# Patient Record
Sex: Female | Born: 1960 | Race: White | Hispanic: No | Marital: Married | Smoking: Current every day smoker
Health system: Southern US, Community
[De-identification: ages and names within clinical notes are randomized; demographics above are authoritative.]

## PROBLEM LIST (undated history)

## (undated) DIAGNOSIS — M549 Dorsalgia, unspecified: Secondary | ICD-10-CM

## (undated) DIAGNOSIS — Q782 Osteopetrosis: Secondary | ICD-10-CM

## (undated) DIAGNOSIS — F410 Panic disorder [episodic paroxysmal anxiety] without agoraphobia: Secondary | ICD-10-CM

## (undated) DIAGNOSIS — G8929 Other chronic pain: Secondary | ICD-10-CM

## (undated) DIAGNOSIS — E0500 Thyrotoxicosis with diffuse goiter without thyrotoxic crisis or storm: Secondary | ICD-10-CM

## (undated) DIAGNOSIS — J449 Chronic obstructive pulmonary disease, unspecified: Secondary | ICD-10-CM

## (undated) DIAGNOSIS — F419 Anxiety disorder, unspecified: Secondary | ICD-10-CM

## (undated) DIAGNOSIS — J189 Pneumonia, unspecified organism: Secondary | ICD-10-CM

## (undated) DIAGNOSIS — E079 Disorder of thyroid, unspecified: Secondary | ICD-10-CM

## (undated) HISTORY — PX: TOTAL ABDOMINAL HYSTERECTOMY: SHX209

## (undated) HISTORY — DX: Panic disorder (episodic paroxysmal anxiety): F41.0

## (undated) HISTORY — PX: NEPHRECTOMY: SHX65

## (undated) HISTORY — PX: TUBAL LIGATION: SHX77

## (undated) HISTORY — DX: Chronic obstructive pulmonary disease, unspecified: J44.9

## (undated) HISTORY — DX: Osteopetrosis: Q78.2

## (undated) HISTORY — DX: Anxiety disorder, unspecified: F41.9

---

## 2005-05-09 ENCOUNTER — Emergency Department (HOSPITAL_COMMUNITY): Admission: EM | Admit: 2005-05-09 | Discharge: 2005-05-09 | Payer: Self-pay | Admitting: Emergency Medicine

## 2006-04-17 ENCOUNTER — Ambulatory Visit (HOSPITAL_COMMUNITY): Admission: RE | Admit: 2006-04-17 | Discharge: 2006-04-17 | Payer: Self-pay | Admitting: Family Medicine

## 2007-05-29 ENCOUNTER — Ambulatory Visit (HOSPITAL_COMMUNITY): Admission: RE | Admit: 2007-05-29 | Discharge: 2007-05-29 | Payer: Self-pay | Admitting: Family Medicine

## 2007-06-24 ENCOUNTER — Ambulatory Visit (HOSPITAL_COMMUNITY): Admission: RE | Admit: 2007-06-24 | Discharge: 2007-06-24 | Payer: Self-pay | Admitting: Family Medicine

## 2007-10-07 ENCOUNTER — Ambulatory Visit (HOSPITAL_COMMUNITY): Admission: RE | Admit: 2007-10-07 | Discharge: 2007-10-07 | Payer: Self-pay | Admitting: Family Medicine

## 2007-12-25 ENCOUNTER — Ambulatory Visit (HOSPITAL_COMMUNITY): Admission: RE | Admit: 2007-12-25 | Discharge: 2007-12-25 | Payer: Self-pay | Admitting: Family Medicine

## 2007-12-30 ENCOUNTER — Ambulatory Visit (HOSPITAL_COMMUNITY): Admission: RE | Admit: 2007-12-30 | Discharge: 2007-12-30 | Payer: Self-pay | Admitting: Surgery

## 2008-01-03 ENCOUNTER — Ambulatory Visit (HOSPITAL_COMMUNITY): Admission: RE | Admit: 2008-01-03 | Discharge: 2008-01-03 | Payer: Self-pay | Admitting: Family Medicine

## 2008-01-09 ENCOUNTER — Encounter (HOSPITAL_COMMUNITY): Admission: RE | Admit: 2008-01-09 | Discharge: 2008-02-08 | Payer: Self-pay | Admitting: Family Medicine

## 2008-01-21 ENCOUNTER — Ambulatory Visit (HOSPITAL_COMMUNITY): Admission: RE | Admit: 2008-01-21 | Discharge: 2008-01-21 | Payer: Self-pay | Admitting: Family Medicine

## 2008-03-09 ENCOUNTER — Emergency Department (HOSPITAL_COMMUNITY): Admission: EM | Admit: 2008-03-09 | Discharge: 2008-03-09 | Payer: Self-pay | Admitting: Emergency Medicine

## 2008-03-16 ENCOUNTER — Emergency Department (HOSPITAL_COMMUNITY): Admission: EM | Admit: 2008-03-16 | Discharge: 2008-03-16 | Payer: Self-pay | Admitting: Emergency Medicine

## 2008-04-02 ENCOUNTER — Encounter: Payer: Self-pay | Admitting: Orthopedic Surgery

## 2008-04-08 ENCOUNTER — Ambulatory Visit: Payer: Self-pay | Admitting: Orthopedic Surgery

## 2008-04-08 DIAGNOSIS — S139XXA Sprain of joints and ligaments of unspecified parts of neck, initial encounter: Secondary | ICD-10-CM | POA: Insufficient documentation

## 2008-04-08 DIAGNOSIS — M5020 Other cervical disc displacement, unspecified cervical region: Secondary | ICD-10-CM

## 2008-04-10 ENCOUNTER — Telehealth: Payer: Self-pay | Admitting: Orthopedic Surgery

## 2008-04-15 ENCOUNTER — Ambulatory Visit (HOSPITAL_COMMUNITY): Admission: RE | Admit: 2008-04-15 | Discharge: 2008-04-15 | Payer: Self-pay | Admitting: Orthopedic Surgery

## 2008-04-15 ENCOUNTER — Encounter: Admission: RE | Admit: 2008-04-15 | Discharge: 2008-07-07 | Payer: Self-pay | Admitting: Orthopedic Surgery

## 2008-04-16 ENCOUNTER — Encounter: Payer: Self-pay | Admitting: Orthopedic Surgery

## 2008-04-22 ENCOUNTER — Ambulatory Visit: Payer: Self-pay | Admitting: Orthopedic Surgery

## 2008-04-22 DIAGNOSIS — M542 Cervicalgia: Secondary | ICD-10-CM

## 2008-05-16 ENCOUNTER — Emergency Department (HOSPITAL_COMMUNITY): Admission: EM | Admit: 2008-05-16 | Discharge: 2008-05-16 | Payer: Self-pay | Admitting: Emergency Medicine

## 2008-06-17 ENCOUNTER — Ambulatory Visit (HOSPITAL_COMMUNITY): Admission: RE | Admit: 2008-06-17 | Discharge: 2008-06-17 | Payer: Self-pay | Admitting: Internal Medicine

## 2008-06-23 ENCOUNTER — Ambulatory Visit (HOSPITAL_COMMUNITY): Admission: RE | Admit: 2008-06-23 | Discharge: 2008-06-23 | Payer: Self-pay | Admitting: Family Medicine

## 2008-06-23 ENCOUNTER — Emergency Department (HOSPITAL_COMMUNITY): Admission: EM | Admit: 2008-06-23 | Discharge: 2008-06-23 | Payer: Self-pay | Admitting: Emergency Medicine

## 2008-07-20 ENCOUNTER — Emergency Department (HOSPITAL_COMMUNITY): Admission: EM | Admit: 2008-07-20 | Discharge: 2008-07-20 | Payer: Self-pay | Admitting: Emergency Medicine

## 2008-10-15 ENCOUNTER — Ambulatory Visit (HOSPITAL_COMMUNITY): Admission: RE | Admit: 2008-10-15 | Discharge: 2008-10-15 | Payer: Self-pay | Admitting: Internal Medicine

## 2009-06-28 ENCOUNTER — Ambulatory Visit (HOSPITAL_COMMUNITY): Admission: RE | Admit: 2009-06-28 | Discharge: 2009-06-28 | Payer: Self-pay | Admitting: Family Medicine

## 2010-01-12 ENCOUNTER — Emergency Department (HOSPITAL_COMMUNITY)
Admission: EM | Admit: 2010-01-12 | Discharge: 2010-01-12 | Payer: Self-pay | Source: Home / Self Care | Admitting: Emergency Medicine

## 2010-01-12 ENCOUNTER — Encounter: Payer: Self-pay | Admitting: Orthopedic Surgery

## 2010-02-02 ENCOUNTER — Ambulatory Visit: Payer: Self-pay | Admitting: Orthopedic Surgery

## 2010-02-02 DIAGNOSIS — M6530 Trigger finger, unspecified finger: Secondary | ICD-10-CM | POA: Insufficient documentation

## 2010-02-09 ENCOUNTER — Ambulatory Visit: Payer: Self-pay | Admitting: Orthopedic Surgery

## 2010-02-14 ENCOUNTER — Telehealth: Payer: Self-pay | Admitting: Orthopedic Surgery

## 2010-04-01 ENCOUNTER — Ambulatory Visit (HOSPITAL_COMMUNITY)
Admission: RE | Admit: 2010-04-01 | Discharge: 2010-04-01 | Payer: Self-pay | Source: Home / Self Care | Attending: Family Medicine | Admitting: Family Medicine

## 2010-04-18 ENCOUNTER — Encounter: Payer: Self-pay | Admitting: Family Medicine

## 2010-04-19 ENCOUNTER — Ambulatory Visit (HOSPITAL_COMMUNITY)
Admission: RE | Admit: 2010-04-19 | Discharge: 2010-04-19 | Payer: Self-pay | Source: Home / Self Care | Attending: Family Medicine | Admitting: Family Medicine

## 2010-04-26 NOTE — Assessment & Plan Note (Signed)
Summary: NEW PROB/AP RT THUMB INJURY/XR [facility] 01/12/10/[facility]/CAF   Vital Signs:  Patient profile:   50 year old female Height:      64 inches Weight:      106 pounds Pulse rate:   70 / minute Resp:     16 per minute  Vitals Entered By: Fuller Canada MD (February 02, 2010 9:43 AM)  Visit Type:  new problem  Referring Provider:  ap er Primary Provider:  Dr. Lysbeth Galas  CC:  right thumb.  History of Present Illness: I saw Heather Wang in the office today for an initial visit.  She is a 50 years old woman with the complaint of:  right thumb.  No injury.  Xrays [facility] 01/12/10 rt thumb.  Meds: Zoloft and Xanax.  This is a 50 year old female, who was moving 2 panels of sheet rock about 8 weeks ago and over the next 2 days developed flexion contracture of the interphalangeal joint of the RIGHT thumb.  she complains of sharp pain over the A1 pulley with inability to extend the IP joint of the thumb.  She reports sudden onset of symptoms, which seem to come and go     Allergies (verified): 1)  ! Nsaids  Past History:  Past Medical History:  Anxiety, Panic Attacks   Family History: Family History Coronary Heart Disease female < 9 FH of Cancer:  Family History of Arthritis  Social History: Patient is married.  unemployed smokes 1 ppd no alcohol coffee daily 11th grade ed  Review of Systems Constitutional:  Denies weight loss, weight gain, fever, chills, and fatigue. Cardiovascular:  Denies chest pain, palpitations, fainting, and murmurs. Respiratory:  Denies short of breath, wheezing, couch, tightness, pain on inspiration, and snoring . Gastrointestinal:  Denies heartburn, nausea, vomiting, diarrhea, constipation, and blood in your stools. Genitourinary:  Denies frequency, urgency, difficulty urinating, painful urination, flank pain, and bleeding in urine. Neurologic:  Denies numbness, tingling, unsteady gait, dizziness, tremors, and seizure. Musculoskeletal:   Denies joint pain, swelling, instability, stiffness, redness, heat, and muscle pain. Endocrine:  Denies excessive thirst, exessive urination, and heat or cold intolerance. Psychiatric:  Complains of depression and anxiety; denies nervousness and hallucinations. Skin:  Denies changes in the skin, poor healing, rash, itching, and redness. HEENT:  Denies blurred or double vision, eye pain, redness, and watering. Immunology:  Denies seasonal allergies, sinus problems, and allergic to bee stings. Hemoatologic:  Complains of brusing; denies easy bleeding.  Physical Exam  Additional Exam:  vital signs her weight is 106 pounds, height 5 feet 4 inches.  Gen. appearance she has a small frame.  She is oriented x3.  Her mood and affect are normal.  Ambulation is normal.  The RIGHT thumb is tender and swollen over the A1 pulley of the thumb and also in the thenar eminence. She has no range of motion at the interphalangeal joint. I do not detect instability or subluxation of the joint. She has no extension power at this point.  The skin is intact, and temperature of the digit is normal. Capillary refill is excellent. Sensation is intact as   Impression & Recommendations:  Problem # 1:  TRIGGER FINGER, RIGHT THUMB (ICD-727.03) Assessment New  injection RIGHT thumb at the A1 pulley, and application of RIGHT thumb aluminum splint  Verbal consent was obtained: The finger was prepped with ethyl chloride and injected with 1:1 injection of .25% sensorcaine, 1cc  and 40 mg of depomedrol, 1cc. There were no complications.  The x-rays  were done at [facility]. The report and the films have been reviewed.  Orders: Est. Patient Level IV (16109) Injection, Tendon / Ligament (60454)  Medications Added to Medication List This Visit: 1)  Norco 5-325 Mg Tabs (Hydrocodone-acetaminophen) .Marland Kitchen.. 1 q 4 h as needed  Patient Instructions: 1)  You have received an injection of cortisone today. You may experience  increased pain at the injection site. Apply ice pack to the area for 20 minutes every 2 hours and take 2 xtra strength tylenol every 8 hours. This increased pain will usually resolve in 24 hours. The injection will take effect in 3-10 days.  2)  keep splint on until Monday  3)  return next week Weds  Prescriptions: NORCO 5-325 MG TABS (HYDROCODONE-ACETAMINOPHEN) 1 q 4 h as needed  #30 x 0   Entered and Authorized by:   Fuller Canada MD   Signed by:   Fuller Canada MD on 02/02/2010   Method used:   Print then Give to Patient   RxID:   [id number]    Orders Added: 1)  Est. Patient Level IV [30865] 2)  Injection, Tendon / Ligament [20550]

## 2010-04-26 NOTE — Assessment & Plan Note (Signed)
Summary: 1 wk reck/ rt thumb/[facility] discount/caf   Visit Type:  Follow-up Referring Provider:  ap er Primary Provider:  Dr. Lysbeth Galas  CC:  recheck trigger finger.  History of Present Illness: I saw Heather Wang in the office today for an initial visit.  She is a 50 years old woman with the complaint of:  right thumb.  No injury.  Xrays [facility] 01/12/10 rt thumb.  Meds: Zolof, Norco 5 given last visit for pain and Xanax.  This is a 50 year old female, who was moving 2 panels of sheet rock about 9 weeks ago and over the next 2 days developed flexion contracture of the interphalangeal joint of the RIGHT thumb.  today for reevaluation. She took the splint off Monday. The finger locked again. It has continued to lock.  Recommend trigger release, RIGHT thumb, and then splinting in extension for 2 weeks and then start flexion.   Allergies (verified): 1)  ! Nsaids  Past History:  Past Medical History: Last updated: 02/02/2010  Anxiety, Panic Attacks   Past Surgical History: Last updated: 04/08/2008 Nephrectomy:Native Total Abdominal Hysterectomy Tubal ligation   Family History: Last updated: 02/02/2010 Family History Coronary Heart Disease female < 41 FH of Cancer:  Family History of Arthritis  Social History: Last updated: 02/02/2010 Patient is married.  unemployed smokes 1 ppd no alcohol coffee daily 11th grade ed  Risk Factors: Smoking Status: current (04/08/2008) Packs/Day: 1 (04/08/2008)  Review of Systems       Review of Systems  Constitutional:  Denies weight loss, weight gain, fever, chills, and fatigue. Cardiovascular:  Denies chest pain, palpitations, fainting, and murmurs. Respiratory:  Denies short of breath, wheezing, couch, tightness, pain on inspiration, and snoring . Gastrointestinal:  Denies heartburn, nausea, vomiting, diarrhea, constipation, and blood in your stools. Genitourinary:  Denies frequency, urgency, difficulty urinating, painful urination,  flank pain, and bleeding in urine. Neurologic:  Denies numbness, tingling, unsteady gait, dizziness, tremors, and seizure. Musculoskeletal:  Denies joint pain, swelling, instability, stiffness, redness, heat, and muscle pain. Endocrine:  Denies excessive thirst, exessive urination, and heat or cold intolerance. Psychiatric:  Complains of depression and anxiety; denies nervousness and hallucinations. Skin:  Denies changes in the skin, poor healing, rash, itching, and redness. HEENT:  Denies blurred or double vision, eye pain, redness, and watering. Immunology:  Denies seasonal allergies, sinus problems, and allergic to bee stings. Hemoatologic:  Complains of brusing; denies easy bleeding.  Physical Exam  Additional Exam:  vital signs her weight is 106 pounds, height 5 feet 4 inches.  Gen. appearance she has a small frame.  She is oriented x3.  Her mood and affect are normal.  Ambulation is normal.  The RIGHT thumb is tender and swollen over the A1 pulley of the thumb and also in the thenar eminence. She has no range of motion at the interphalangeal joint. I do not detect instability or subluxation of the joint. She has no extension power at this point.  The skin is intact, and temperature of the digit is normal. Capillary refill is excellent. Sensation is intact as    Impression & Recommendations:  Problem # 1:  TRIGGER FINGER, RIGHT THUMB (ICD-727.03) Assessment Unchanged  Orders: Est. Patient Level III (09811)  Patient Instructions: 1)  PLAN RIGHT THUMB A1 PULLEY RELEASE / TRIGGER RELEASE  2)  DOS 02/14/10 3)  Preop at [facility] short stay on Friday 02/11/10 register at 1pm, take packet with you 4)  Post op 1 in our office on 02/16/10  Orders Added: 1)  Est. Patient Level III [81859]

## 2010-04-26 NOTE — Letter (Signed)
Summary: history form  history form   Imported By: Jacklynn Ganong 02/02/2010 12:01:24  _____________________________________________________________________  External Attachment:    Type:   Image     Comment:   External Document

## 2010-04-26 NOTE — Progress Notes (Signed)
Summary: Patient did not have surgery today   ---- Converted from flag ---- ---- 02/14/2010 3:34 PM, Chasity L Kandis Ban wrote: pt did not have surgery today per Dr Rexene Edison ------------------------------

## 2010-07-07 LAB — DIFFERENTIAL
Eosinophils Relative: 2 % (ref 0–5)
Lymphocytes Relative: 34 % (ref 12–46)
Lymphs Abs: 1.7 10*3/uL (ref 0.7–4.0)
Monocytes Absolute: 0.3 10*3/uL (ref 0.1–1.0)
Neutro Abs: 2.9 10*3/uL (ref 1.7–7.7)

## 2010-07-07 LAB — COMPREHENSIVE METABOLIC PANEL
AST: 16 U/L (ref 0–37)
Albumin: 3.9 g/dL (ref 3.5–5.2)
BUN: 6 mg/dL (ref 6–23)
Calcium: 9.6 mg/dL (ref 8.4–10.5)
Creatinine, Ser: 0.75 mg/dL (ref 0.4–1.2)
GFR calc Af Amer: >60 mL/min (ref >60)
Total Protein: 6.9 g/dL (ref 6.0–8.3)

## 2010-07-07 LAB — CBC
HCT: 40.7 % (ref 36.0–46.0)
MCHC: 34.6 g/dL (ref 30.0–36.0)
MCV: 88.4 fL (ref 78.0–100.0)
Platelets: 228 10*3/uL (ref 150–400)
RDW: 13.6 % (ref 11.5–15.5)
WBC: 5.1 10*3/uL (ref 4.0–10.5)

## 2010-07-08 ENCOUNTER — Encounter: Payer: Self-pay | Admitting: Orthopedic Surgery

## 2010-07-21 ENCOUNTER — Encounter: Payer: Self-pay | Admitting: Orthopedic Surgery

## 2010-07-21 ENCOUNTER — Ambulatory Visit: Payer: Self-pay | Admitting: Orthopedic Surgery

## 2010-08-24 ENCOUNTER — Ambulatory Visit (HOSPITAL_COMMUNITY)
Admission: RE | Admit: 2010-08-24 | Discharge: 2010-08-24 | Disposition: A | Discharge status: Home / Self Care | Payer: Self-pay | Source: Ambulatory Visit | Attending: *Deleted | Admitting: *Deleted

## 2010-08-24 ENCOUNTER — Other Ambulatory Visit (HOSPITAL_COMMUNITY): Payer: Self-pay | Admitting: *Deleted

## 2010-08-24 DIAGNOSIS — M545 Low back pain, unspecified: Secondary | ICD-10-CM | POA: Insufficient documentation

## 2010-08-24 DIAGNOSIS — M79609 Pain in unspecified limb: Secondary | ICD-10-CM | POA: Insufficient documentation

## 2010-08-28 ENCOUNTER — Emergency Department (HOSPITAL_COMMUNITY): Payer: Self-pay

## 2010-08-28 ENCOUNTER — Emergency Department (HOSPITAL_COMMUNITY)
Admission: EM | Admit: 2010-08-28 | Discharge: 2010-08-28 | Disposition: A | Discharge status: Home / Self Care | Payer: Self-pay | Attending: Emergency Medicine | Admitting: Emergency Medicine

## 2010-08-28 DIAGNOSIS — F17200 Nicotine dependence, unspecified, uncomplicated: Secondary | ICD-10-CM | POA: Insufficient documentation

## 2010-08-28 DIAGNOSIS — R079 Chest pain, unspecified: Secondary | ICD-10-CM | POA: Insufficient documentation

## 2010-08-28 DIAGNOSIS — J189 Pneumonia, unspecified organism: Secondary | ICD-10-CM | POA: Insufficient documentation

## 2010-08-30 ENCOUNTER — Emergency Department (HOSPITAL_COMMUNITY)
Admission: EM | Admit: 2010-08-30 | Discharge: 2010-08-30 | Disposition: A | Discharge status: Home / Self Care | Payer: Self-pay | Attending: Emergency Medicine | Admitting: Emergency Medicine

## 2010-08-30 ENCOUNTER — Emergency Department (HOSPITAL_COMMUNITY): Payer: Self-pay

## 2010-08-30 DIAGNOSIS — F17200 Nicotine dependence, unspecified, uncomplicated: Secondary | ICD-10-CM | POA: Insufficient documentation

## 2010-08-30 DIAGNOSIS — J189 Pneumonia, unspecified organism: Secondary | ICD-10-CM | POA: Insufficient documentation

## 2010-08-30 DIAGNOSIS — R071 Chest pain on breathing: Secondary | ICD-10-CM | POA: Insufficient documentation

## 2010-08-30 LAB — CBC
HCT: 39.7 % (ref 36.0–46.0)
Hemoglobin: 13 g/dL (ref 12.0–15.0)
MCHC: 32.7 g/dL (ref 30.0–36.0)
MCV: 89.2 fL (ref 78.0–100.0)
RDW: 13 % (ref 11.5–15.5)
WBC: 11.8 10*3/uL — ABNORMAL HIGH (ref 4.0–10.5)

## 2010-08-30 LAB — BASIC METABOLIC PANEL
CO2: 30 mEq/L (ref 19–32)
Calcium: 10.2 mg/dL (ref 8.4–10.5)
Chloride: 99 mEq/L (ref 96–112)
GFR calc Af Amer: >60 mL/min (ref >60)
Glucose, Bld: 159 mg/dL — ABNORMAL HIGH (ref 70–99)
Sodium: 136 mEq/L (ref 135–145)

## 2010-08-30 LAB — DIFFERENTIAL
Basophils Absolute: 0 10*3/uL (ref 0.0–0.1)
Eosinophils Relative: 0 % (ref 0–5)
Lymphocytes Relative: 10 % — ABNORMAL LOW (ref 12–46)
Lymphs Abs: 1.2 10*3/uL (ref 0.7–4.0)
Monocytes Absolute: 0.6 10*3/uL (ref 0.1–1.0)
Neutro Abs: 10 10*3/uL — ABNORMAL HIGH (ref 1.7–7.7)

## 2011-01-29 ENCOUNTER — Emergency Department (HOSPITAL_COMMUNITY): Payer: Self-pay

## 2011-01-29 ENCOUNTER — Inpatient Hospital Stay (HOSPITAL_COMMUNITY)
Admission: EM | Admit: 2011-01-29 | Discharge: 2011-01-30 | DRG: 194 | Disposition: A | Discharge status: Home / Self Care | Payer: Self-pay | Attending: Internal Medicine | Admitting: Internal Medicine

## 2011-01-29 ENCOUNTER — Encounter (HOSPITAL_COMMUNITY): Payer: Self-pay

## 2011-01-29 DIAGNOSIS — J4489 Other specified chronic obstructive pulmonary disease: Secondary | ICD-10-CM | POA: Diagnosis present

## 2011-01-29 DIAGNOSIS — F411 Generalized anxiety disorder: Secondary | ICD-10-CM | POA: Diagnosis present

## 2011-01-29 DIAGNOSIS — F419 Anxiety disorder, unspecified: Secondary | ICD-10-CM | POA: Diagnosis present

## 2011-01-29 DIAGNOSIS — J449 Chronic obstructive pulmonary disease, unspecified: Secondary | ICD-10-CM | POA: Diagnosis present

## 2011-01-29 DIAGNOSIS — R091 Pleurisy: Secondary | ICD-10-CM | POA: Diagnosis present

## 2011-01-29 DIAGNOSIS — F17200 Nicotine dependence, unspecified, uncomplicated: Secondary | ICD-10-CM | POA: Diagnosis present

## 2011-01-29 DIAGNOSIS — Z720 Tobacco use: Secondary | ICD-10-CM | POA: Diagnosis present

## 2011-01-29 DIAGNOSIS — J159 Unspecified bacterial pneumonia: Secondary | ICD-10-CM

## 2011-01-29 DIAGNOSIS — J189 Pneumonia, unspecified organism: Principal | ICD-10-CM | POA: Diagnosis present

## 2011-01-29 DIAGNOSIS — F329 Major depressive disorder, single episode, unspecified: Secondary | ICD-10-CM | POA: Diagnosis present

## 2011-01-29 DIAGNOSIS — F32A Depression, unspecified: Secondary | ICD-10-CM | POA: Diagnosis present

## 2011-01-29 DIAGNOSIS — E0590 Thyrotoxicosis, unspecified without thyrotoxic crisis or storm: Secondary | ICD-10-CM | POA: Diagnosis present

## 2011-01-29 DIAGNOSIS — E871 Hypo-osmolality and hyponatremia: Secondary | ICD-10-CM | POA: Diagnosis present

## 2011-01-29 DIAGNOSIS — F3289 Other specified depressive episodes: Secondary | ICD-10-CM | POA: Diagnosis present

## 2011-01-29 HISTORY — DX: Disorder of thyroid, unspecified: E07.9

## 2011-01-29 LAB — URINE MICROSCOPIC-ADD ON

## 2011-01-29 LAB — CBC
Hemoglobin: 13.5 g/dL (ref 12.0–15.0)
MCH: 29.3 pg (ref 26.0–34.0)
MCHC: 33.6 g/dL (ref 30.0–36.0)
MCV: 87.2 fL (ref 78.0–100.0)
RBC: 4.61 MIL/uL (ref 3.87–5.11)

## 2011-01-29 LAB — DIFFERENTIAL
Basophils Relative: 0 % (ref 0–1)
Eosinophils Absolute: 0 10*3/uL (ref 0.0–0.7)
Eosinophils Relative: 0 % (ref 0–5)
Lymphs Abs: 1 10*3/uL (ref 0.7–4.0)
Monocytes Absolute: 0.6 10*3/uL (ref 0.1–1.0)
Monocytes Relative: 4 % (ref 3–12)

## 2011-01-29 LAB — BASIC METABOLIC PANEL
BUN: 11 mg/dL (ref 6–23)
Calcium: 9.7 mg/dL (ref 8.4–10.5)
Creatinine, Ser: 0.85 mg/dL (ref 0.50–1.10)
GFR calc Af Amer: >90 mL/min (ref >90)
GFR calc non Af Amer: 79 mL/min — ABNORMAL LOW (ref >90)
Glucose, Bld: 118 mg/dL — ABNORMAL HIGH (ref 70–99)

## 2011-01-29 LAB — URINALYSIS, ROUTINE W REFLEX MICROSCOPIC
Bilirubin Urine: NEGATIVE
Specific Gravity, Urine: >1.03 — ABNORMAL HIGH (ref 1.005–1.030)
Urobilinogen, UA: 0.2 mg/dL (ref 0.0–1.0)

## 2011-01-29 MED ORDER — SERTRALINE HCL 50 MG PO TABS
100.0000 mg | ORAL_TABLET | Freq: Two times a day (BID) | ORAL | Status: DC
  Administered 2011-01-30: 100 mg via ORAL
  Filled 2011-01-29 (×2): qty 2

## 2011-01-29 MED ORDER — ENOXAPARIN SODIUM 40 MG/0.4ML ~~LOC~~ SOLN
40.0000 mg | SUBCUTANEOUS | Status: DC
  Administered 2011-01-29: 40 mg via SUBCUTANEOUS
  Filled 2011-01-29: qty 0.4

## 2011-01-29 MED ORDER — MORPHINE SULFATE 2 MG/ML IJ SOLN
2.0000 mg | INTRAMUSCULAR | Status: DC | PRN
  Administered 2011-01-29 (×2): 2 mg via INTRAVENOUS
  Filled 2011-01-29 (×2): qty 1

## 2011-01-29 MED ORDER — TRAZODONE HCL 50 MG PO TABS: 50.0000 mg | ORAL_TABLET | Freq: Every evening | ORAL | Status: DC | PRN

## 2011-01-29 MED ORDER — PROPYLTHIOURACIL 50 MG PO TABS
50.0000 mg | ORAL_TABLET | Freq: Two times a day (BID) | ORAL | Status: DC
  Filled 2011-01-29 (×2): qty 1

## 2011-01-29 MED ORDER — SENNA 8.6 MG PO TABS: 2.0000 | ORAL_TABLET | Freq: Every day | ORAL | Status: DC | PRN

## 2011-01-29 MED ORDER — PANTOPRAZOLE SODIUM 40 MG PO TBEC
80.0000 mg | DELAYED_RELEASE_TABLET | Freq: Every day | ORAL | Status: DC
  Administered 2011-01-30: 80 mg via ORAL
  Filled 2011-01-29: qty 2
  Filled 2011-01-29: qty 1

## 2011-01-29 MED ORDER — AZITHROMYCIN 250 MG PO TABS
250.0000 mg | ORAL_TABLET | Freq: Every day | ORAL | Status: DC
  Administered 2011-01-29 – 2011-01-30 (×2): 250 mg via ORAL
  Filled 2011-01-29 (×2): qty 1

## 2011-01-29 MED ORDER — GUAIFENESIN-DM 100-10 MG/5ML PO SYRP: 5.0000 mL | ORAL_SOLUTION | ORAL | Status: DC | PRN

## 2011-01-29 MED ORDER — PROMETHAZINE HCL 25 MG/ML IJ SOLN: 12.5000 mg | Freq: Four times a day (QID) | INTRAMUSCULAR | Status: DC | PRN

## 2011-01-29 MED ORDER — DOCUSATE SODIUM 100 MG PO CAPS
100.0000 mg | ORAL_CAPSULE | Freq: Two times a day (BID) | ORAL | Status: DC
  Administered 2011-01-29 – 2011-01-30 (×2): 100 mg via ORAL
  Filled 2011-01-29 (×2): qty 1

## 2011-01-29 MED ORDER — MORPHINE SULFATE 4 MG/ML IJ SOLN
4.0000 mg | Freq: Once | INTRAMUSCULAR | Status: AC
  Administered 2011-01-29: 4 mg via INTRAVENOUS
  Filled 2011-01-29: qty 1

## 2011-01-29 MED ORDER — ALPRAZOLAM 1 MG PO TABS
1.0000 mg | ORAL_TABLET | Freq: Three times a day (TID) | ORAL | Status: DC
  Administered 2011-01-30: 1 mg via ORAL
  Filled 2011-01-29 (×2): qty 1

## 2011-01-29 MED ORDER — OXYCODONE HCL 5 MG PO TABS
5.0000 mg | ORAL_TABLET | ORAL | Status: DC | PRN
  Administered 2011-01-30: 5 mg via ORAL
  Filled 2011-01-29: qty 1

## 2011-01-29 MED ORDER — ACETAMINOPHEN 325 MG PO TABS
650.0000 mg | ORAL_TABLET | Freq: Four times a day (QID) | ORAL | Status: DC | PRN
  Administered 2011-01-29: 650 mg via ORAL
  Filled 2011-01-29: qty 2

## 2011-01-29 MED ORDER — PROMETHAZINE HCL 12.5 MG PO TABS: 12.5000 mg | ORAL_TABLET | Freq: Four times a day (QID) | ORAL | Status: DC | PRN

## 2011-01-29 MED ORDER — SODIUM CHLORIDE 0.9 % IV SOLN
INTRAVENOUS | Status: DC
  Administered 2011-01-29 – 2011-01-30 (×2): via INTRAVENOUS

## 2011-01-29 MED ORDER — ALUM & MAG HYDROXIDE-SIMETH 200-200-20 MG/5ML PO SUSP: 30.0000 mL | Freq: Four times a day (QID) | ORAL | Status: DC | PRN

## 2011-01-29 MED ORDER — ALBUTEROL SULFATE (5 MG/ML) 0.5% IN NEBU: 2.5000 mg | INHALATION_SOLUTION | RESPIRATORY_TRACT | Status: DC | PRN

## 2011-01-29 MED ORDER — AZITHROMYCIN 250 MG PO TABS
500.0000 mg | ORAL_TABLET | Freq: Once | ORAL | Status: AC
  Administered 2011-01-29: 500 mg via ORAL
  Filled 2011-01-29: qty 2

## 2011-01-29 MED ORDER — ONDANSETRON HCL 4 MG/2ML IJ SOLN
4.0000 mg | Freq: Once | INTRAMUSCULAR | Status: AC
  Administered 2011-01-29: 4 mg via INTRAVENOUS
  Filled 2011-01-29: qty 2

## 2011-01-29 MED ORDER — DEXTROSE 5 % IV SOLN
1.0000 g | INTRAVENOUS | Status: DC
  Administered 2011-01-29: 1 g via INTRAVENOUS
  Filled 2011-01-29 (×2): qty 10

## 2011-01-29 NOTE — ED Notes (Signed)
Pt placed on O2 due to pt's O2 sats at 92-94%; after O2 applied O2 sats to 96%; pt also placed in trendelenburg due to hypotension

## 2011-01-29 NOTE — ED Provider Notes (Addendum)
History     CSN: [id number] Arrival date & time: 01/29/2011 11:45 AM   First MD Initiated Contact with Patient 01/29/11 1158      Chief Complaint  Patient presents with  . Generalized Body Aches  . Fever  . Chills  . Flank Pain    (Consider location/radiation/quality/duration/timing/severity/associated sxs/prior treatment) Patient is a 50 y.o. female presenting with flank pain. The history is provided by the patient.  Flank Pain This is a new problem. Episode onset: Two days ago. The problem occurs constantly. The problem has been unchanged. Associated symptoms include chills, a fever, myalgias and nausea. Pertinent negatives include no abdominal pain, arthralgias, chest pain, congestion, headaches, joint swelling, neck pain, numbness, rash, sore throat or weakness. The symptoms are aggravated by walking and coughing (deep inspiration). She has tried acetaminophen for the symptoms. The treatment provided no relief.  Flank Pain This is a new problem. Episode onset: Two days ago. The problem occurs constantly. The problem has been unchanged. Pertinent negatives include no chest pain, no abdominal pain, no headaches and no shortness of breath. The symptoms are aggravated by walking and coughing (deep inspiration). She has tried acetaminophen for the symptoms. The treatment provided no relief.    Past Medical History  Diagnosis Date  . Anxiety   . Panic attacks   . Thyroid disease     Past Surgical History  Procedure Date  . Nephrectomy     native   . Total abdominal hysterectomy   . Tubal ligation     Family History  Problem Relation Age of Onset  . Heart defect      family history   . Cancer      family history   . Arthritis      family history     History  Substance Use Topics  . Smoking status: Current Everyday Smoker -- 1.5 packs/day    Types: Cigarettes  . Smokeless tobacco: Not on file  . Alcohol Use: No    OB History    Grav Para Term Preterm Abortions  TAB SAB Ect Mult Living                  Review of Systems  Constitutional: Positive for fever and chills.  HENT: Negative for congestion, sore throat and neck pain.   Eyes: Negative.   Respiratory: Negative for chest tightness and shortness of breath.   Cardiovascular: Negative for chest pain.  Gastrointestinal: Positive for nausea. Negative for abdominal pain.  Genitourinary: Positive for flank pain. Negative for dysuria, hematuria, vaginal discharge and vaginal pain.  Musculoskeletal: Positive for myalgias and back pain. Negative for joint swelling and arthralgias.  Skin: Negative.  Negative for rash and wound.  Neurological: Negative for dizziness, weakness, light-headedness, numbness and headaches.  Hematological: Negative.   Psychiatric/Behavioral: Negative.     Allergies  Nsaids and Sulfa antibiotics  Home Medications  No current outpatient prescriptions on file.  BP 88/54  Pulse 79  Temp(Src) 102.9 F (39.4 C) (Oral)  Resp 20  Ht 5\' 4"  (1.626 m)  Wt 99 lb 10.4 oz (45.2 kg)  BMI 17.10 kg/m2  SpO2 91%  Physical Exam  Nursing note and vitals reviewed. Constitutional: She is oriented to person, place, and time. She appears well-developed and well-nourished. No distress.       Appears fatigued.   HENT:  Head: Normocephalic and atraumatic.  Eyes: Conjunctivae are normal.  Neck: Normal range of motion. Neck supple.  Cardiovascular: Normal rate, regular rhythm, normal  heart sounds and intact distal pulses.        Pedal pulses normal.  Pulmonary/Chest: Effort normal. No respiratory distress. She has decreased breath sounds in the right lower field and the left lower field. She has no wheezes. She has no rales. She exhibits no tenderness.  Abdominal: Soft. Bowel sounds are normal. She exhibits no distension and no mass. There is no tenderness.  Musculoskeletal: Normal range of motion. She exhibits no edema.       Lumbar back: She exhibits tenderness. She exhibits no  swelling, no edema and no spasm.  Neurological: She is alert and oriented to person, place, and time. She has normal strength. She displays no atrophy and no tremor. No cranial nerve deficit or sensory deficit. Gait normal.  Reflex Scores:      Patellar reflexes are 2+ on the right side and 2+ on the left side.      Achilles reflexes are 2+ on the right side and 2+ on the left side.      No strength deficit noted in hip and knee flexor and extensor muscle groups.  Ankle flexion and extension intact.  Skin: Skin is warm and dry. She is not diaphoretic.  Psychiatric: She has a normal mood and affect.    ED Course  Procedures (including critical care time)  Labs Reviewed  CBC - Abnormal; Notable for the following:    WBC 17.0 (*)    All other components within normal limits  DIFFERENTIAL - Abnormal; Notable for the following:    Neutrophils Relative 90 (*)    Neutro Abs 15.4 (*)    Lymphocytes Relative 6 (*)    All other components within normal limits  BASIC METABOLIC PANEL - Abnormal; Notable for the following:    Sodium 131 (*)    Glucose, Bld 118 (*)    GFR calc non Af Amer 79 (*)    All other components within normal limits  URINALYSIS, ROUTINE W REFLEX MICROSCOPIC - Abnormal; Notable for the following:    Specific Gravity, Urine >1.030 (*)    Hgb urine dipstick TRACE (*)    Ketones, ur TRACE (*)    Protein, ur 30 (*)    All other components within normal limits  URINE MICROSCOPIC-ADD ON - Abnormal; Notable for the following:    Squamous Epithelial / LPF FEW (*)    All other components within normal limits  COMPREHENSIVE METABOLIC PANEL  CBC  DIFFERENTIAL   Dg Chest 2 View  01/29/2011  *RADIOLOGY REPORT*  Clinical Data: Shortness of breath with cough and congestion.  CHEST - 2 VIEW  Comparison: 08/30/2010  Findings: Two-view exam shows a focal airspace disease in the posterior left lower lobe, consistent with pneumonia.  Right lung is clear. The cardiopericardial silhouette  is within normal limits for size. Imaged bony structures of the thorax are intact.  IMPRESSION: Posterior left lower lobe pneumonia.  Original Report Authenticated By: ERIC A. MANSELL, M.D.     1. Community acquired bacterial pneumonia     Rocephin 1 gram IV,  zithromax 500 mg PO.  IV fluids while in ed.    MDM  Hypotensive (although pt states her bp is usually low),  Febrile.  Ambulated in dept with sats to 88% (briefly, then pops up to 92-93%.  Call to Dr Lendell Caprice to discuss - will admit.          Candis Musa, PA  Medical screening examination/treatment/procedure(s) were conducted as a shared visit with non-physician practitioner(s) and myself.  I personally evaluated the patient during the encounter                                             Pt complains of weakness.  pe lungs clear.  Heart rrr. Dx pneumonia 01/29/11 1730  [name], MD 01/30/11 2440  [name], MD 01/30/11 [id number]

## 2011-01-29 NOTE — ED Notes (Signed)
Pt presents with left flank pain, fever, chills, and generalized body aches x 2 days. Pt states she has taken tylenol. Pt also c/o nausea. NAD at this time.

## 2011-01-29 NOTE — H&P (Signed)
Hospital Admission Note Date: 01/29/2011  Patient name: Heather Wang Medical record number: [id number] Date of birth: 10-21-1960 Age: 50 y.o. Gender: female PCP: Vertis Kelch, NP, NP, [facility] Department  Attending physician: Christiane Ha  Chief Complaint: Back pain  History of Present Illness: Heather Wang is an 50 y.o. female who presents with a several day history of left-sided back pain which is worse with inspiration. She's had fevers and chills. She has a mild nonproductive cough. She smokes a pack and a half of cigarettes a day. Her appetite has been poor. She's had no diarrhea, sore throat, rhinorrhea or dyspnea. She has a history of COPD but rarely takes bronchodilators. In the emergency room, she was found to have a left lower lobe infiltrate on chest x-ray and a leukocytosis. Initially, she was afebrile. Her blood pressure was borderline low but apparently she tends to run low blood pressures at baseline. With ambulating, she became very fatigued and had an oxygen saturation of 89%. She has received Rocephin, azithromycin, and a liter of fluid. She's also received morphine. She is allergic to nonsteroidal anti-inflammatories. She has had pneumonia in the past.  Past Medical History  Diagnosis Date  . Anxiety   . Panic attacks   . Thyroid disease    Social history: She is married. She is currently caring for her teenage grandkids. She smokes a pack and a half of cigarettes a day. She doesn't drink or use drugs.  Family history: Her brother died of a stroke. Her mother had Alzheimer's disease. Her father had heart disease.  Meds: Prescriptions prior to admission  Medication Sig Dispense Refill  . ALPRAZolam (XANAX) 1 MG tablet Take 1 mg by mouth 3 (three) times daily.        Marland Kitchen omeprazole (PRILOSEC) 40 MG capsule Take 40 mg by mouth daily before breakfast.        . propylthiouracil (PTU) 50 MG tablet Take 50 mg by mouth 2 (two) times daily.        .  sertraline (ZOLOFT) 100 MG tablet Take 100 mg by mouth 2 (two) times daily.        Marland Kitchen acetaminophen (TYLENOL) 500 MG tablet Take 1,000 mg by mouth every 6 (six) hours as needed. For pain        Allergies: Nsaids and Sulfa antibiotics  Past Surgical History  Procedure Date  . Nephrectomy     native   . Total abdominal hysterectomy   . Tubal ligation    Review of Systems: Systems reviewed. As above otherwise negative.  Physical Exam: Blood pressure 88/54, pulse 79, temperature 102.9 F (39.4 C), temperature source Oral, resp. rate 20, height 5\' 4"  (1.626 m), weight 45.2 kg (99 lb 10.4 oz), SpO2 91.00%. BP 88/54  Pulse 79  Temp(Src) 102.9 F (39.4 C) (Oral)  Resp 20  Ht 5\' 4"  (1.626 m)  Wt 45.2 kg (99 lb 10.4 oz)  BMI 17.10 kg/m2  SpO2 91%  General Appearance:    Alert, cooperative, no distress, appears stated age  Head:    Normocephalic, without obvious abnormality, atraumatic  Eyes:    PERRL, conjunctiva/corneas clear, EOM's intact, fundi    benign, both eyes  Ears:    Normal TM's and external ear canals, both ears  Nose:   Nares normal, septum midline, mucosa normal, no drainage    or sinus tenderness  Throat:   Lips, mucosa, and tongue normal; teeth and gums normal  Neck:   Supple, symmetrical, trachea  midline, no adenopathy;    thyroid:  no enlargement/tenderness/nodules; no carotid   bruit or JVD  Back:     Symmetric, no curvature, ROM normal, no CVA tenderness  Lungs:     Clear to auscultation bilaterally, respirations unlabored  Chest Wall:    No tenderness or deformity   Heart:    Regular rate and rhythm, S1 and S2 normal, no murmur, rub   or gallop  Breast Exam:    deferred   Abdomen:     Soft, non-tender, bowel sounds active all four quadrants,    no masses, no organomegaly  Genitalia:   deferred   Rectal:   deferred   Extremities:   Extremities normal, atraumatic, no cyanosis or edema  Pulses:   2+ and symmetric all extremities  Skin:   Skin color, texture,  turgor normal, no rashes or lesions  Lymph nodes:   Cervical, supraclavicular, and axillary nodes normal  Neurologic:   CNII-XII intact, normal strength, sensation and reflexes    throughout    Psychiatric: Normal affect. Calm and cooperative.  Lab results: Basic Metabolic Panel:  Basename 01/29/11 1235  NA 131*  K 3.8  CL 97  CO2 25  GLUCOSE 118*  BUN 11  CREATININE 0.85  CALCIUM 9.7  MG --  PHOS --   Liver Function Tests: No results found for this basename: AST:2,ALT:2,ALKPHOS:2,BILITOT:2,PROT:2,ALBUMIN:2 in the last 72 hours No results found for this basename: LIPASE:2,AMYLASE:2 in the last 72 hours No results found for this basename: AMMONIA:2 in the last 72 hours CBC:  Basename 01/29/11 1235  WBC 17.0*  NEUTROABS 15.4*  HGB 13.5  HCT 40.2  MCV 87.2  PLT 159    Imaging results:  Dg Chest 2 View  01/29/2011  *RADIOLOGY REPORT*  Clinical Data: Shortness of breath with cough and congestion.  CHEST - 2 VIEW  Comparison: 08/30/2010  Findings: Two-view exam shows a focal airspace disease in the posterior left lower lobe, consistent with pneumonia.  Right lung is clear. The cardiopericardial silhouette is within normal limits for size. Imaged bony structures of the thorax are intact.  IMPRESSION: Posterior left lower lobe pneumonia.  Original Report Authenticated By: ERIC A. MANSELL, M.D.    Assessment & Plan: Principal Problem:  *CAP (community acquired pneumonia) Active Problems:  Hyperthyroidism  COPD (chronic obstructive pulmonary disease)  Pleurisy  Tobacco abuse  Hyponatremia  Depression  Anxiety  Patient will be admitted to observation. Continue IV fluids, Rocephin and azithromycin. Check blood cultures, urine Legionella antigen, urine strep pneumonia antigen. She needs to quit smoking. Pain medication for the pleurisy. Monitor sodium. Continue outpatient medications.  Mikella Linsley L 01/29/2011, 5:28 PM

## 2011-01-30 LAB — DIFFERENTIAL
Basophils Absolute: 0 10*3/uL (ref 0.0–0.1)
Basophils Relative: 0 % (ref 0–1)
Lymphocytes Relative: 15 % (ref 12–46)
Monocytes Absolute: 0.4 10*3/uL (ref 0.1–1.0)
Monocytes Relative: 4 % (ref 3–12)
Neutro Abs: 8.1 10*3/uL — ABNORMAL HIGH (ref 1.7–7.7)
Neutrophils Relative %: 81 % — ABNORMAL HIGH (ref 43–77)

## 2011-01-30 LAB — COMPREHENSIVE METABOLIC PANEL
ALT: 12 U/L (ref 0–35)
BUN: 11 mg/dL (ref 6–23)
CO2: 24 mEq/L (ref 19–32)
Calcium: 8.6 mg/dL (ref 8.4–10.5)
GFR calc Af Amer: >90 mL/min (ref >90)
GFR calc non Af Amer: >90 mL/min (ref >90)
Glucose, Bld: 105 mg/dL — ABNORMAL HIGH (ref 70–99)
Total Protein: 5.8 g/dL — ABNORMAL LOW (ref 6.0–8.3)

## 2011-01-30 LAB — STREP PNEUMONIAE URINARY ANTIGEN: Strep Pneumo Urinary Antigen: NEGATIVE

## 2011-01-30 LAB — CBC
HCT: 32.6 % — ABNORMAL LOW (ref 36.0–46.0)
Hemoglobin: 10.9 g/dL — ABNORMAL LOW (ref 12.0–15.0)
MCHC: 33.4 g/dL (ref 30.0–36.0)
RDW: 13 % (ref 11.5–15.5)
WBC: 10 10*3/uL (ref 4.0–10.5)

## 2011-01-30 MED ORDER — LEVOFLOXACIN 500 MG PO TABS: 750.0000 mg | ORAL_TABLET | Freq: Every day | ORAL | Status: AC

## 2011-01-30 MED ORDER — GUAIFENESIN-DM 100-10 MG/5ML PO SYRP: 5.0000 mL | ORAL_SOLUTION | ORAL | Status: AC | PRN

## 2011-01-30 MED ORDER — HYDROCODONE-ACETAMINOPHEN 5-325 MG PO TABS
1.0000 | ORAL_TABLET | Freq: Four times a day (QID) | ORAL | Status: AC | PRN
Start: 2011-01-30 — End: 2011-02-09

## 2011-01-30 NOTE — Progress Notes (Signed)
Pt discharged home via family; Pt and family given and explained all discharge instructions, carenotes, and prescriptions; pt and family stated understanding and denied questions/concerns; all f/u appointments in place; IV removed without complicaitons; pt stable at time of discharge  

## 2011-01-30 NOTE — Discharge Summary (Signed)
Physician Discharge Summary  Patient ID: Heather Wang MRN: [id number] DOB/AGE: 1960/10/12 50 y.o.  Admit date: 01/29/2011 Discharge date: 01/30/2011  Discharge Diagnoses:  Principal Problem:  *CAP (community acquired pneumonia) Active Problems:  Hyperthyroidism  COPD (chronic obstructive pulmonary disease)  Pleurisy  Tobacco abuse  Hyponatremia  Depression  Anxiety   Current Discharge Medication List    START taking these medications   Details  guaiFENesin-dextromethorphan (ROBITUSSIN DM) 100-10 MG/5ML syrup Take 5 mLs by mouth every 4 (four) hours as needed for cough. Qty: 118 mL    HYDROcodone-acetaminophen (NORCO) 5-325 MG per tablet Take 1-2 tablets by mouth every 6 (six) hours as needed for pain. Qty: 20 tablet, Refills: 0    levofloxacin (LEVAQUIN) 500 MG tablet Take 1.5 tablets (750 mg total) by mouth daily. Qty: 5 tablet, Refills: 0      CONTINUE these medications which have NOT CHANGED   Details  ALPRAZolam (XANAX) 1 MG tablet Take 1 mg by mouth 3 (three) times daily.      omeprazole (PRILOSEC) 40 MG capsule Take 40 mg by mouth daily before breakfast.      propylthiouracil (PTU) 50 MG tablet Take 50 mg by mouth 2 (two) times daily.      sertraline (ZOLOFT) 100 MG tablet Take 100 mg by mouth 2 (two) times daily.      acetaminophen (TYLENOL) 500 MG tablet Take 1,000 mg by mouth every 6 (six) hours as needed. For pain         Discharge Orders    Future Orders Please Complete By Expires   Diet general      Increase activity slowly      Discharge instructions      Comments:   Quit smoking. Drink plenty of fluids      Follow-up Information    Follow up with Vertis Kelch, NP. Make an appointment in 4 weeks.         Disposition: Home or Self Care  Discharged Condition: stable  Consults:  none  Labs:   Results for orders placed during the hospital encounter of 01/29/11 (from the past 48 hour(s))  CBC     Status: Abnormal   Collection Time   01/29/11 12:35 PM      Component Value Range Comment   WBC 17.0 (*) 4.0 - 10.5 (K/uL)    RBC 4.61  3.87 - 5.11 (MIL/uL)    Hemoglobin 13.5  12.0 - 15.0 (g/dL)    HCT 40.9  81.1 - 91.4 (%)    MCV 87.2  78.0 - 100.0 (fL)    MCH 29.3  26.0 - 34.0 (pg)    MCHC 33.6  30.0 - 36.0 (g/dL)    RDW 78.2  95.6 - 21.3 (%)    Platelets 159  150 - 400 (K/uL)   DIFFERENTIAL     Status: Abnormal   Collection Time   01/29/11 12:35 PM      Component Value Range Comment   Neutrophils Relative 90 (*) 43 - 77 (%)    Neutro Abs 15.4 (*) 1.7 - 7.7 (K/uL)    Lymphocytes Relative 6 (*) 12 - 46 (%)    Lymphs Abs 1.0  0.7 - 4.0 (K/uL)    Monocytes Relative 4  3 - 12 (%)    Monocytes Absolute 0.6  0.1 - 1.0 (K/uL)    Eosinophils Relative 0  0 - 5 (%)    Eosinophils Absolute 0.0  0.0 - 0.7 (K/uL)    Basophils Relative 0  0 - 1 (%)    Basophils Absolute 0.0  0.0 - 0.1 (K/uL)   BASIC METABOLIC PANEL     Status: Abnormal   Collection Time   01/29/11 12:35 PM      Component Value Range Comment   Sodium 131 (*) 135 - 145 (mEq/L)    Potassium 3.8  3.5 - 5.1 (mEq/L)    Chloride 97  96 - 112 (mEq/L)    CO2 25  19 - 32 (mEq/L)    Glucose, Bld 118 (*) 70 - 99 (mg/dL)    BUN 11  6 - 23 (mg/dL)    Creatinine, Ser 4.09  0.50 - 1.10 (mg/dL)    Calcium 9.7  8.4 - 10.5 (mg/dL)    GFR calc non Af Amer 79 (*) >90 (mL/min)    GFR calc Af Amer >90  >90 (mL/min)   URINALYSIS, ROUTINE W REFLEX MICROSCOPIC     Status: Abnormal   Collection Time   01/29/11  2:20 PM      Component Value Range Comment   Color, Urine YELLOW  YELLOW     Appearance CLEAR  CLEAR     Specific Gravity, Urine >1.030 (*) 1.005 - 1.030     pH 5.5  5.0 - 8.0     Glucose, UA NEGATIVE  NEGATIVE (mg/dL)    Hgb urine dipstick TRACE (*) NEGATIVE     Bilirubin Urine NEGATIVE  NEGATIVE     Ketones, ur TRACE (*) NEGATIVE (mg/dL)    Protein, ur 30 (*) NEGATIVE (mg/dL)    Urobilinogen, UA 0.2  0.0 - 1.0 (mg/dL)    Nitrite NEGATIVE  NEGATIVE     Leukocytes,  UA NEGATIVE  NEGATIVE    URINE MICROSCOPIC-ADD ON     Status: Abnormal   Collection Time   01/29/11  2:20 PM      Component Value Range Comment   Squamous Epithelial / LPF FEW (*) RARE     WBC, UA 3-6  <3 (WBC/hpf)    RBC / HPF 3-6  <3 (RBC/hpf)    Bacteria, UA RARE  RARE    CULTURE, BLOOD (ROUTINE X 2)     Status: Normal (Preliminary result)   Collection Time   01/29/11  5:31 PM      Component Value Range Comment   Specimen Description BLOOD LEFT ANTECUBITAL      Special Requests BOTTLES DRAWN AEROBIC AND ANAEROBIC 7CC      Culture PENDING      Report Status PENDING     CULTURE, BLOOD (ROUTINE X 2)     Status: Normal (Preliminary result)   Collection Time   01/29/11  6:00 PM      Component Value Range Comment   Specimen Description BLOOD BLOOD LEFT ARM      Special Requests BOTTLES DRAWN AEROBIC AND ANAEROBIC 6CC      Culture PENDING      Report Status PENDING     COMPREHENSIVE METABOLIC PANEL     Status: Abnormal   Collection Time   01/30/11  4:53 AM      Component Value Range Comment   Sodium 134 (*) 135 - 145 (mEq/L)    Potassium 4.1  3.5 - 5.1 (mEq/L)    Chloride 104  96 - 112 (mEq/L)    CO2 24  19 - 32 (mEq/L)    Glucose, Bld 105 (*) 70 - 99 (mg/dL)    BUN 11  6 - 23 (mg/dL)    Creatinine, Ser 8.11  0.50 - 1.10 (mg/dL)    Calcium 8.6  8.4 - 10.5 (mg/dL)    Total Protein 5.8 (*) 6.0 - 8.3 (g/dL)    Albumin 2.6 (*) 3.5 - 5.2 (g/dL)    AST 18  0 - 37 (U/L)    ALT 12  0 - 35 (U/L)    Alkaline Phosphatase 92  39 - 117 (U/L)    Total Bilirubin 0.3  0.3 - 1.2 (mg/dL)    GFR calc non Af Amer >90  >90 (mL/min)    GFR calc Af Amer >90  >90 (mL/min)   CBC     Status: Abnormal   Collection Time   01/30/11  4:53 AM      Component Value Range Comment   WBC 10.0  4.0 - 10.5 (K/uL)    RBC 3.70 (*) 3.87 - 5.11 (MIL/uL)    Hemoglobin 10.9 (*) 12.0 - 15.0 (g/dL)    HCT 16.1 (*) 09.6 - 46.0 (%)    MCV 88.1  78.0 - 100.0 (fL)    MCH 29.5  26.0 - 34.0 (pg)    MCHC 33.4  30.0 - 36.0  (g/dL)    RDW 04.5  40.9 - 81.1 (%)    Platelets 105 (*) 150 - 400 (K/uL) DELTA CHECK NOTED  DIFFERENTIAL     Status: Abnormal   Collection Time   01/30/11  4:53 AM      Component Value Range Comment   Neutrophils Relative 81 (*) 43 - 77 (%)    Neutro Abs 8.1 (*) 1.7 - 7.7 (K/uL)    Lymphocytes Relative 15  12 - 46 (%)    Lymphs Abs 1.5  0.7 - 4.0 (K/uL)    Monocytes Relative 4  3 - 12 (%)    Monocytes Absolute 0.4  0.1 - 1.0 (K/uL)    Eosinophils Relative 0  0 - 5 (%)    Eosinophils Absolute 0.0  0.0 - 0.7 (K/uL)    Basophils Relative 0  0 - 1 (%)    Basophils Absolute 0.0  0.0 - 0.1 (K/uL)     Diagnostics:  Dg Chest 2 View  01/29/2011  *RADIOLOGY REPORT*  Clinical Data: Shortness of breath with cough and congestion.  CHEST - 2 VIEW  Comparison: 08/30/2010  Findings: Two-view exam shows a focal airspace disease in the posterior left lower lobe, consistent with pneumonia.  Right lung is clear. The cardiopericardial silhouette is within normal limits for size. Imaged bony structures of the thorax are intact.  IMPRESSION: Posterior left lower lobe pneumonia.  Original Report Authenticated By: ERIC A. MANSELL, M.D.   Full Code   Hospital Course: See H&P for complete admission details. The patient is a 50 year old smoker her presented with right back pain worse with inspiration. She had had symptoms for several days. She had a mild nonproductive cough, subjective fevers chills, poor appetite and weakness. She smokes a pack and a half of cigarettes a day. In the emergency room, she had a borderline low blood pressure but apparently has chronically borderline low blood pressures. She was found to have a right lower lobe infiltrate, leukocytosis and spiked a fever to 102. Initially there was lots of sending her home but she ambulated and he came very dyspneic and weak and her saturation dropped to 89%. She had no shortness of breath. Her lungs were clear. She was given a liter of saline and admitted  to the hospitalist service. She avoids nonsteroidal anti-inflammatories do to having had a nephrectomy  in the past. She responded well to opiate analgesics, IV fluids, Rocephin and azithromycin. The day of discharge, she is feeling much better, more comfortable. Her vital signs are stable. She is able to ambulate and is medically cleared to complete the course of antibiotics at home. She is encouraged to quit smoking and was given counseling by nursing staff as well. She is to drink plenty of fluids and followup with the health department.  Discharge Exam: Blood pressure 111/64, pulse 81, temperature 98.8 F (37.1 C), temperature source Oral, resp. rate 18, height 5\' 4"  (1.626 m), weight 45.2 kg (99 lb 10.4 oz), SpO2 92.00%.  Unchanged from 01/29/11, except appears more comfortable  Signed: Ahmaud Duthie L 01/30/2011, 9:58 AM

## 2011-01-31 LAB — LEGIONELLA ANTIGEN, URINE: Legionella Antigen, Urine: NEGATIVE

## 2011-02-04 LAB — CULTURE, BLOOD (ROUTINE X 2)
Culture: NO GROWTH
Culture: NO GROWTH

## 2011-03-02 ENCOUNTER — Ambulatory Visit (HOSPITAL_COMMUNITY)
Admission: RE | Admit: 2011-03-02 | Discharge: 2011-03-02 | Disposition: A | Discharge status: Home / Self Care | Payer: Self-pay | Source: Ambulatory Visit | Attending: Family Medicine | Admitting: Family Medicine

## 2011-03-02 ENCOUNTER — Other Ambulatory Visit (HOSPITAL_COMMUNITY): Payer: Self-pay | Admitting: Family Medicine

## 2011-03-02 DIAGNOSIS — J189 Pneumonia, unspecified organism: Secondary | ICD-10-CM | POA: Insufficient documentation

## 2011-03-17 ENCOUNTER — Emergency Department (HOSPITAL_COMMUNITY)
Admission: EM | Admit: 2011-03-17 | Discharge: 2011-03-17 | Disposition: A | Discharge status: Home / Self Care | Payer: Self-pay | Attending: Emergency Medicine | Admitting: Emergency Medicine

## 2011-03-17 ENCOUNTER — Encounter (HOSPITAL_COMMUNITY): Payer: Self-pay | Admitting: Emergency Medicine

## 2011-03-17 ENCOUNTER — Emergency Department (HOSPITAL_COMMUNITY): Payer: Self-pay

## 2011-03-17 DIAGNOSIS — R0789 Other chest pain: Secondary | ICD-10-CM

## 2011-03-17 DIAGNOSIS — F17200 Nicotine dependence, unspecified, uncomplicated: Secondary | ICD-10-CM | POA: Insufficient documentation

## 2011-03-17 DIAGNOSIS — E079 Disorder of thyroid, unspecified: Secondary | ICD-10-CM | POA: Insufficient documentation

## 2011-03-17 DIAGNOSIS — R509 Fever, unspecified: Secondary | ICD-10-CM | POA: Insufficient documentation

## 2011-03-17 DIAGNOSIS — R071 Chest pain on breathing: Secondary | ICD-10-CM | POA: Insufficient documentation

## 2011-03-17 DIAGNOSIS — F411 Generalized anxiety disorder: Secondary | ICD-10-CM | POA: Insufficient documentation

## 2011-03-17 HISTORY — DX: Pneumonia, unspecified organism: J18.9

## 2011-03-17 NOTE — ED Notes (Signed)
Pt states she has been dealing with pneumonia since Nov. Pt c/o rt rib pain since last night.

## 2011-03-17 NOTE — ED Provider Notes (Signed)
History     CSN: [id number]  Arrival date & time 03/17/11  1053   First MD Initiated Contact with Patient 03/17/11 1301      Chief Complaint  Patient presents with  . rib pain     (Consider location/radiation/quality/duration/timing/severity/associated sxs/prior treatment) HPI  Patient relates she was admitted November 4 for pneumonia and she had a low blood pressure, low-grade fever and low oxygen. She states she was only admitted overnight. She states she had a lack of energy for a while after that. She was seen in followup by her Dr. on December 6 and at that point she had a pneumonia and was treated with clarithromycin twice a day for 10 days. She stay after that she was feeling better. She was seen yesterday and her doctor thought she heard some wheezing which cleared when she coughed. She was started on a Proventil inhaler and prednisone 10 mg twice a day for 5 days. She relates about 2:30 in morning she woke up with pain in her right posterior chest but felt better when she asked her husband to rub it really hard. She states she woke up again at 5:30 in the morning and with same pain and had her husband rub it again and this time she took some Flexeril that she been prescribed in June. She relates her pain is much improved now. She states she  now has a soreness in that area. She states she has a cough mainly at night that's dry. She denies fever or shortness of breath. She states the pain moves around in her right chest. Husband states she's lost weight from 107 pounds to 93 pounds.  Reviewing patient's chest x-rays on canopy she had a left lower lobe pneumonia on November 4, on December 6 she had a pneumonia at the right base, and on December 21 she had a persistent right base infiltrate/atelectasis.  Primary care physician [facility]  Past Medical History  Diagnosis Date  . Anxiety   . Panic attacks   . Thyroid disease   . Pneumonia     Past Surgical  History  Procedure Date  . Nephrectomy     native   . Total abdominal hysterectomy   . Tubal ligation     Family History  Problem Relation Age of Onset  . Heart defect      family history   . Cancer      family history   . Arthritis      family history     History  Substance Use Topics  . Smoking status: Current Everyday Smoker -- 1.5 packs/day    Types: Cigarettes  . Smokeless tobacco: Not on file  . Alcohol Use: No   patient lives with spouse Agent has smoked as much as 2 packs per day, she states she's trying to quit  OB History    Grav Para Term Preterm Abortions TAB SAB Ect Mult Living                  Review of Systems  All other systems reviewed and are negative.    Allergies  Nsaids and Sulfa antibiotics  Home Medications     BP 91/48  Pulse 81  Temp(Src) 98.2 F (36.8 C) (Oral)  Resp 20  Ht 5\' 4"  (1.626 m)  Wt 93 lb (42.185 kg)  BMI 15.96 kg/m2  SpO2 96%  Vital signs show hypotension otherwise normal  (patient states her blood pressures always in the 90s)  Physical Exam  Nursing note and vitals reviewed. Constitutional: She is oriented to person, place, and time.  Non-toxic appearance. She does not appear ill. No distress.       Thin white female who is sleeping in no distress  HENT:  Head: Normocephalic and atraumatic.  Right Ear: External ear normal.  Left Ear: External ear normal.  Nose: Nose normal. No mucosal edema or rhinorrhea.  Mouth/Throat: Oropharynx is clear and moist and mucous membranes are normal. No dental abscesses or uvula swelling.  Eyes: Conjunctivae and EOM are normal. Pupils are equal, round, and reactive to light.  Neck: Normal range of motion and full passive range of motion without pain. Neck supple.  Cardiovascular: Normal rate, regular rhythm and normal heart sounds.  Exam reveals no gallop and no friction rub.   No murmur heard. Pulmonary/Chest: Effort normal and breath sounds normal. No respiratory distress.  She has no wheezes. She has no rhonchi. She has no rales. She exhibits tenderness. She exhibits no crepitus.       She has some mild tenderness in her right posterior chest below the tip of the right scapula. There is no crepitance swelling or bruising seen however she indicates the pain can be in her mid axillary line and other areas on the right side.  Abdominal: Soft. Normal appearance and bowel sounds are normal. She exhibits no distension. There is no tenderness. There is no rebound and no guarding.  Musculoskeletal: Normal range of motion. She exhibits no edema and no tenderness.       Moves all extremities well.   Neurological: She is alert and oriented to person, place, and time. She has normal strength. No cranial nerve deficit.  Skin: Skin is warm, dry and intact. No rash noted. No erythema. No pallor.  Psychiatric: She has a normal mood and affect. Her speech is normal and behavior is normal. Her mood appears not anxious.    ED Course  Procedures (including critical care time)  When I reviewed the patient's x-ray she has a persistent right lower lobe pneumonia that's been present less than 3 weeks, which can be expected. We discussed that if it's still present  In another 3 weeks she should have her doctor do a CT scan to look for  a mass or tumor.  Labs Reviewed - No data to display Dg Chest 2 View  03/17/2011  *RADIOLOGY REPORT*  Clinical Data: Right posterior rib pain.  CHEST - 2 VIEW  Comparison: 03/02/2011.  Findings: Patchy distal atelectasis or infiltrate right lung base. Small right effusion.  Left lung clear.  Normal heart size. The more central opacity noted on 12/06 has improved slightly.  IMPRESSION: Patchy distal CP angle atelectasis or infiltrate at the right lung base.  Small right effusion.  Original Report Authenticated By: Elsie Stain, M.D.     1. Chest wall pain     Plan discharge Patient has Flexeril which has helped her pain she is advised to take it. When  I look at her bottle she has many pills left.  Devoria Albe, MD, FACEP   MDM          Ward Givens, MD 03/17/11 [phone]

## 2011-04-05 ENCOUNTER — Other Ambulatory Visit (HOSPITAL_COMMUNITY): Payer: Self-pay | Admitting: Family Medicine

## 2011-04-05 DIAGNOSIS — E0590 Thyrotoxicosis, unspecified without thyrotoxic crisis or storm: Secondary | ICD-10-CM

## 2011-04-20 ENCOUNTER — Other Ambulatory Visit (HOSPITAL_COMMUNITY): Payer: Self-pay | Admitting: Family Medicine

## 2011-04-20 DIAGNOSIS — Z139 Encounter for screening, unspecified: Secondary | ICD-10-CM

## 2011-04-24 ENCOUNTER — Other Ambulatory Visit (HOSPITAL_COMMUNITY): Payer: Self-pay | Admitting: Family Medicine

## 2011-04-24 ENCOUNTER — Ambulatory Visit (HOSPITAL_COMMUNITY)
Admission: RE | Admit: 2011-04-24 | Discharge: 2011-04-24 | Disposition: A | Discharge status: Home / Self Care | Payer: Self-pay | Source: Ambulatory Visit | Attending: Family Medicine | Admitting: Family Medicine

## 2011-04-24 ENCOUNTER — Ambulatory Visit (HOSPITAL_COMMUNITY)
Admission: RE | Admit: 2011-04-24 | Discharge: 2011-04-24 | Disposition: A | Discharge status: Home / Self Care | Payer: PRIVATE HEALTH INSURANCE | Source: Ambulatory Visit | Attending: Family Medicine | Admitting: Family Medicine

## 2011-04-24 DIAGNOSIS — E0590 Thyrotoxicosis, unspecified without thyrotoxic crisis or storm: Secondary | ICD-10-CM

## 2011-04-24 DIAGNOSIS — E0500 Thyrotoxicosis with diffuse goiter without thyrotoxic crisis or storm: Secondary | ICD-10-CM | POA: Insufficient documentation

## 2011-04-24 DIAGNOSIS — Z1231 Encounter for screening mammogram for malignant neoplasm of breast: Secondary | ICD-10-CM | POA: Insufficient documentation

## 2011-04-24 DIAGNOSIS — Z139 Encounter for screening, unspecified: Secondary | ICD-10-CM

## 2011-04-24 DIAGNOSIS — J189 Pneumonia, unspecified organism: Secondary | ICD-10-CM | POA: Insufficient documentation

## 2011-06-28 ENCOUNTER — Emergency Department (HOSPITAL_COMMUNITY): Payer: Self-pay

## 2011-06-28 ENCOUNTER — Emergency Department (HOSPITAL_COMMUNITY)
Admission: EM | Admit: 2011-06-28 | Discharge: 2011-06-28 | Disposition: A | Discharge status: Home / Self Care | Payer: Self-pay | Attending: Emergency Medicine | Admitting: Emergency Medicine

## 2011-06-28 ENCOUNTER — Encounter (HOSPITAL_COMMUNITY): Payer: Self-pay | Admitting: *Deleted

## 2011-06-28 DIAGNOSIS — R05 Cough: Secondary | ICD-10-CM | POA: Insufficient documentation

## 2011-06-28 DIAGNOSIS — R059 Cough, unspecified: Secondary | ICD-10-CM | POA: Insufficient documentation

## 2011-06-28 DIAGNOSIS — E079 Disorder of thyroid, unspecified: Secondary | ICD-10-CM | POA: Insufficient documentation

## 2011-06-28 DIAGNOSIS — J189 Pneumonia, unspecified organism: Secondary | ICD-10-CM | POA: Insufficient documentation

## 2011-06-28 DIAGNOSIS — R5381 Other malaise: Secondary | ICD-10-CM | POA: Insufficient documentation

## 2011-06-28 DIAGNOSIS — F411 Generalized anxiety disorder: Secondary | ICD-10-CM | POA: Insufficient documentation

## 2011-06-28 DIAGNOSIS — Z79899 Other long term (current) drug therapy: Secondary | ICD-10-CM | POA: Insufficient documentation

## 2011-06-28 DIAGNOSIS — R509 Fever, unspecified: Secondary | ICD-10-CM | POA: Insufficient documentation

## 2011-06-28 DIAGNOSIS — R5383 Other fatigue: Secondary | ICD-10-CM | POA: Insufficient documentation

## 2011-06-28 DIAGNOSIS — F17200 Nicotine dependence, unspecified, uncomplicated: Secondary | ICD-10-CM | POA: Insufficient documentation

## 2011-06-28 MED ORDER — GUAIFENESIN-CODEINE 100-10 MG/5ML PO SYRP: 5.0000 mL | ORAL_SOLUTION | Freq: Three times a day (TID) | ORAL | Status: AC | PRN

## 2011-06-28 MED ORDER — AZITHROMYCIN 250 MG PO TABS: ORAL_TABLET | ORAL | Status: AC

## 2011-06-28 MED ORDER — AMOXICILLIN 500 MG PO TABS: 1000.0000 mg | ORAL_TABLET | Freq: Two times a day (BID) | ORAL | Status: AC

## 2011-06-28 NOTE — ED Notes (Signed)
Pt states she has had cough chills and fever for a week.

## 2011-06-28 NOTE — Discharge Instructions (Signed)
Pneumonia, Adult Pneumonia is an infection of the lungs. It may be caused by a germ (virus or bacteria). Some types of pneumonia can spread easily from person to person. This can happen when you cough or sneeze. HOME CARE  Only take medicine as told by your doctor.   Take your medicine (antibiotics) as told. Finish it even if you start to feel better.   Do not smoke.   You may use a vaporizer or humidifier in your room. This can help loosen thick spit (mucus).   Sleep so you are almost sitting up (semi-upright). This helps reduce coughing.   Rest.  A shot (vaccine) can help prevent pneumonia. Shots are often advised for:  People over 68 years old.   Patients on chemotherapy.   People with long-term (chronic) lung problems.   People with immune system problems.  GET HELP RIGHT AWAY IF:   You are getting worse.   You cannot control your cough, and you are losing sleep.   You cough up blood.   Your pain gets worse, even with medicine.   You have a fever.   Any of your problems are getting worse, not better.   You have shortness of breath or chest pain.  MAKE SURE YOU:   Understand these instructions.   Will watch your condition.   Will get help right away if you are not doing well or get worse.  Document Released: 08/30/2007 Document Revised: 03/02/2011 Document Reviewed: 06/03/2010 [facility] Patient Information 2012 [place], [place].  RETURN IMMEDIATELY IF you develop shortness of breath, confusion or altered mental status, a new rash, become dizzy, faint, or poorly responsive, or are unable to be cared for at home.  Stop smoking.

## 2011-06-28 NOTE — ED Provider Notes (Signed)
History   This chart was scribed for Heather Horn, MD by Clarita Crane. The patient was seen in room APA04/APA04. Patient's care was started at 0859.    CSN: [id number]  Arrival date & time 06/28/11  5956   First MD Initiated Contact with Patient 06/28/11 [phone]      Chief Complaint  Patient presents with  . Cough  .     (Consider location/radiation/quality/duration/timing/severity/associated sxs/prior treatment) HPI DIAMONDS LIPPARD is a 51 y.o. female who presents to the Emergency Department complaining of constant moderate to severe cough onset 1 week ago with associated chills and generalized weakness. Notes coughing is generally worse during the night. Reports no relief from over the counter cough medications and that she attempted to use inhaler but was unable to tolerate inhaler as it exacerbated her anxiety/shakiness. Denies chest pain, SOB, syncope, lightheadedness, confusion, swelling of lower extremities. Patient notes she was hospitalized in November for pneumonia and had a follow up CXR performed in January which showed improvement with pneumonia. Patient with h/o thyroid disease, nephrectomy, abdominal hysterectomy, anxiety. Patient is a current smoker.  Past Medical History  Diagnosis Date  . Anxiety   . Panic attacks   . Thyroid disease   . Pneumonia     Past Surgical History  Procedure Date  . Nephrectomy     native   . Total abdominal hysterectomy   . Tubal ligation     Family History  Problem Relation Age of Onset  . Heart defect      family history   . Cancer      family history   . Arthritis      family history     History  Substance Use Topics  . Smoking status: Current Everyday Smoker -- 1.5 packs/day    Types: Cigarettes  . Smokeless tobacco: Not on file  . Alcohol Use: No    OB History    Grav Para Term Preterm Abortions TAB SAB Ect Mult Living                  Review of Systems A complete 10 system review of systems was obtained and  all systems are negative except as noted in the HPI and PMH.   Allergies  Nsaids and Sulfa antibiotics  Home Medications   Current Outpatient Rx  Name Route Sig Dispense Refill  . ACETAMINOPHEN 500 MG PO TABS Oral Take 1,000 mg by mouth every 6 (six) hours as needed. For pain     . ALBUTEROL SULFATE HFA 108 (90 BASE) MCG/ACT IN AERS Inhalation Inhale 2 puffs into the lungs every 6 (six) hours as needed. For shortness of breath     . ALPRAZOLAM 1 MG PO TABS Oral Take 1 mg by mouth 3 (three) times daily.      . CYCLOBENZAPRINE HCL 10 MG PO TABS Oral Take 10 mg by mouth 3 (three) times daily as needed.      Marland Kitchen OMEPRAZOLE 40 MG PO CPDR Oral Take 40 mg by mouth daily before breakfast.      . PROPYLTHIOURACIL 50 MG PO TABS Oral Take 50 mg by mouth 2 (two) times daily.      . SERTRALINE HCL 100 MG PO TABS Oral Take 100 mg by mouth 2 (two) times daily.        BP 117/53  Pulse 102  Temp(Src) 98.9 F (37.2 C) (Oral)  Resp 18  SpO2 96%  Physical Exam  Nursing note and vitals reviewed.  Constitutional: She appears well-developed and well-nourished. No distress.       Awake, alert, nontoxic appearance.  HENT:  Head: Normocephalic and atraumatic.  Mouth/Throat: Oropharynx is clear and moist.  Eyes: Right eye exhibits no discharge. Left eye exhibits no discharge.  Neck: Neck supple.  Cardiovascular: Normal rate and regular rhythm.   No murmur heard. Pulmonary/Chest: Effort normal. No respiratory distress. She has no wheezes. She has no rales. She exhibits no tenderness.  Abdominal: Soft. There is no tenderness. There is no rebound.  Musculoskeletal: She exhibits no tenderness.       Baseline ROM, no obvious new focal weakness.  Neurological:       Mental status and motor strength appears baseline for patient and situation.  Skin: No rash noted.  Psychiatric: She has a normal mood and affect.    ED Course  Procedures (including critical care time)  DIAGNOSTIC STUDIES: Oxygen  Saturation is 96% on room air, adequate by my interpretation.    COORDINATION OF CARE: 9:44AM- Patient informed of current plan for treatment and evaluation and agrees with plan at this time. Will order CXR to evaluate possible pneumonia. Patient notes she does not want to received breathing treatment in ED. 11:10AM- Patient informed of apparent pneumonia present on CXR. Will treat with abx and d/x home.    Labs Reviewed - No data to display Dg Chest 2 View  06/28/2011  *RADIOLOGY REPORT*  Clinical Data: Cough, fever  CHEST - 2 VIEW  Comparison: Chest x-ray of 04/24/2011  Findings: There is patchy opacity within the right upper lobe suspicious for pneumonia.  The remainder of the lungs are clear and slightly hyperaerated.  Mediastinal contours are stable.  The heart is within normal limits in size.  No bony abnormality is seen.  IMPRESSION: Opacity in the right upper lobe most consistent with pneumonia. Recommend follow-up to ensure clearing.  Original Report Authenticated By: Juline Patch, M.D.     No diagnosis found.    MDM   Patient / Family / Caregiver understand and agree with initial ED impression and plan with expectations set for ED visit. Pt stable in ED with no significant deterioration in condition. Patient / Family / Caregiver informed of clinical course, understand medical decision-making process, and agree with plan. I doubt any other EMC precluding discharge at this time including, but not necessarily limited to the following:sepsis.     I personally performed the services described in this documentation, which was scribed in my presence. The recorded information has been reviewed and considered.    Heather Horn, MD 06/28/11 2240

## 2011-06-29 ENCOUNTER — Emergency Department (HOSPITAL_COMMUNITY): Payer: Self-pay

## 2011-06-29 ENCOUNTER — Encounter (HOSPITAL_COMMUNITY): Payer: Self-pay | Admitting: Emergency Medicine

## 2011-06-29 ENCOUNTER — Emergency Department (HOSPITAL_COMMUNITY)
Admission: EM | Admit: 2011-06-29 | Discharge: 2011-06-29 | Disposition: A | Discharge status: Home / Self Care | Payer: Self-pay | Attending: Emergency Medicine | Admitting: Emergency Medicine

## 2011-06-29 DIAGNOSIS — R079 Chest pain, unspecified: Secondary | ICD-10-CM | POA: Insufficient documentation

## 2011-06-29 DIAGNOSIS — J189 Pneumonia, unspecified organism: Secondary | ICD-10-CM | POA: Insufficient documentation

## 2011-06-29 LAB — DIFFERENTIAL
Basophils Relative: 0 % (ref 0–1)
Eosinophils Absolute: 0.1 10*3/uL (ref 0.0–0.7)
Eosinophils Relative: 1 % (ref 0–5)
Lymphs Abs: 1.6 10*3/uL (ref 0.7–4.0)
Monocytes Absolute: 0.7 10*3/uL (ref 0.1–1.0)
Monocytes Relative: 6 % (ref 3–12)
Neutrophils Relative %: 77 % (ref 43–77)

## 2011-06-29 LAB — COMPREHENSIVE METABOLIC PANEL
Albumin: 2.9 g/dL — ABNORMAL LOW (ref 3.5–5.2)
BUN: 4 mg/dL — ABNORMAL LOW (ref 6–23)
Calcium: 9.9 mg/dL (ref 8.4–10.5)
Creatinine, Ser: 0.66 mg/dL (ref 0.50–1.10)
GFR calc Af Amer: >90 mL/min (ref >90)
Glucose, Bld: 105 mg/dL — ABNORMAL HIGH (ref 70–99)
Total Protein: 7.2 g/dL (ref 6.0–8.3)

## 2011-06-29 LAB — CBC
HCT: 37.5 % (ref 36.0–46.0)
Hemoglobin: 12.3 g/dL (ref 12.0–15.0)
MCH: 28.7 pg (ref 26.0–34.0)
MCHC: 32.8 g/dL (ref 30.0–36.0)
MCV: 87.4 fL (ref 78.0–100.0)

## 2011-06-29 MED ORDER — HYDROCODONE-ACETAMINOPHEN 5-325 MG PO TABS: 1.0000 | ORAL_TABLET | Freq: Four times a day (QID) | ORAL | Status: AC | PRN

## 2011-06-29 MED ORDER — HYDROMORPHONE HCL PF 1 MG/ML IJ SOLN
1.0000 mg | Freq: Once | INTRAMUSCULAR | Status: AC
  Administered 2011-06-29: 1 mg via INTRAMUSCULAR
  Filled 2011-06-29: qty 1

## 2011-06-29 NOTE — Discharge Instructions (Signed)
Follow up with your md next week. °

## 2011-06-29 NOTE — ED Provider Notes (Cosign Needed)
History  This chart was scribed for [name], MD by Bennett Scrape. This patient was seen in room APA03/APA03 and the patient's care was started at 8:55AM.  CSN: [id number]  Arrival date & time 06/29/11  0834   First MD Initiated Contact with Patient 06/29/11 [phone]      No chief complaint on file.   The history is provided by the patient. No language interpreter was used.    Heather Wang is a 51 y.o. female who presents to the Emergency Department complaining of 12 hours of gradual onset, gradually worsening, constant left sided chest pain. The pain is described as sharp and non-radiating. It is worse with deep breaths. She rates the pain a 10 out of 10 currently. Pt was seen in this ED 05/28/11 (yesterday) for one week of gradual onset dry coughing and diagnosed with right-sided pneumonia by chest x-ray. She was discharged home with antibiotics. She states that she is still having non-productive coughing but continues on to say that it has improved with the antibiotics.  She reports taking a muscle relaxer for the pain with no improvement in her symptoms. She states that she has experienced similar pains with prior pneumonia diagnoses. Pt reports that she was hospitalized in November 2012 for pneumonia and had a follow up in January which showed improvement in pneumonia by chest x-ray. She denies abdominal pain, congestion, fever, SOB, HA, confusion, and swelling of lower extremities as associated symptoms.  She has a h/o panic attacks and anxiety. Patient is a current everyday smoker but denies alcohol use.   Past Medical History  Diagnosis Date  . Anxiety   . Panic attacks   . Thyroid disease   . Pneumonia     Past Surgical History  Procedure Date  . Nephrectomy     native   . Total abdominal hysterectomy   . Tubal ligation     Family History  Problem Relation Age of Onset  . Heart defect      family history   . Cancer      family history   . Arthritis      family  history     History  Substance Use Topics  . Smoking status: Current Everyday Smoker -- 1.5 packs/day    Types: Cigarettes  . Smokeless tobacco: Not on file  . Alcohol Use: No    Review of Systems  Constitutional: Negative for fatigue.  HENT: Negative for congestion, sinus pressure and ear discharge.   Eyes: Negative for discharge.  Respiratory: Positive for cough. Negative for shortness of breath.   Cardiovascular: Positive for chest pain.  Gastrointestinal: Negative for abdominal pain and diarrhea.  Genitourinary: Negative for frequency and hematuria.  Musculoskeletal: Negative for back pain.  Skin: Negative for rash.  Neurological: Negative for seizures and headaches.  Hematological: Negative.   Psychiatric/Behavioral: Negative for hallucinations.    Allergies  Nsaids and Sulfa antibiotics  Home Medications   Current Outpatient Rx  Name Route Sig Dispense Refill  . ACETAMINOPHEN 500 MG PO TABS Oral Take 1,000 mg by mouth every 6 (six) hours as needed. For pain     . ALBUTEROL SULFATE HFA 108 (90 BASE) MCG/ACT IN AERS Inhalation Inhale 2 puffs into the lungs every 6 (six) hours as needed. For shortness of breath     . ALPRAZOLAM 0.5 MG PO TABS Oral Take 0.5 mg by mouth 3 (three) times daily as needed. For anxiety    . AMOXICILLIN 500 MG PO TABS  Oral Take 2 tablets (1,000 mg total) by mouth 2 (two) times daily. 28 tablet 0  . AZITHROMYCIN 250 MG PO TABS  2 po day one, then 1 daily x 4 days 5 tablet 0  . CYCLOBENZAPRINE HCL 10 MG PO TABS Oral Take 10 mg by mouth 3 (three) times daily as needed. For pain    . GUAIFENESIN-CODEINE 100-10 MG/5ML PO SYRP Oral Take 5 mLs by mouth 3 (three) times daily as needed for cough. 120 mL 0  . OMEPRAZOLE 40 MG PO CPDR Oral Take 40 mg by mouth daily before breakfast.      . PROPYLTHIOURACIL 50 MG PO TABS Oral Take 50 mg by mouth 2 (two) times daily.        Triage Vitals: BP 93/52  Pulse 76  Temp(Src) 98.5 F (36.9 C) (Oral)  Resp 17   SpO2 98%  Physical Exam  Nursing note and vitals reviewed. Constitutional: She is oriented to person, place, and time. She appears well-developed and well-nourished.  HENT:  Head: Normocephalic and atraumatic.  Eyes: Conjunctivae and EOM are normal. No scleral icterus.  Neck: Neck supple. No thyromegaly present.  Cardiovascular: Normal rate and regular rhythm.  Exam reveals no gallop and no friction rub.   No murmur heard. Pulmonary/Chest: No stridor. She has no wheezes. She has no rales. She exhibits tenderness (Left lower lateral chest tenderness).  Abdominal: She exhibits no distension. There is no tenderness. There is no rebound.  Musculoskeletal: Normal range of motion. She exhibits no edema.  Lymphadenopathy:    She has no cervical adenopathy.  Neurological: She is alert and oriented to person, place, and time. Coordination normal.  Skin: Skin is warm and dry. No rash noted. No erythema.  Psychiatric: She has a normal mood and affect. Her behavior is normal.    ED Course  Procedures (including critical care time)  DIAGNOSTIC STUDIES: Oxygen Saturation is 98% on room air, normal by my interpretation.    COORDINATION OF CARE: 8:58AM-Discussed treatment plan with pt and pt agreed to plan. Will order chest x-ray to recheck pneumonia.   Labs Reviewed  DIFFERENTIAL - Abnormal; Notable for the following:    Neutro Abs 8.0 (*)    All other components within normal limits  COMPREHENSIVE METABOLIC PANEL - Abnormal; Notable for the following:    Glucose, Bld 105 (*)    BUN 4 (*)    Albumin 2.9 (*)    Alkaline Phosphatase 121 (*)    Total Bilirubin 0.2 (*)    All other components within normal limits  CBC    Dg Chest 2 View  06/29/2011  *RADIOLOGY REPORT*  Clinical Data: Left chest pain, hurts to take a deep breath  CHEST - 2 VIEW  Comparison: 06/28/2011  Findings: Normal heart size, mediastinal contours, and pulmonary vascularity. Patchy ill-defined and somewhat nodular  appearing density in the right upper lobe likely represents infiltrate, little changed. Underlying bronchitic and question mild emphysematous changes. No pleural effusion or pneumothorax. No acute osseous findings.  IMPRESSION: Bronchitic and question mild emphysematous changes with persistent patchy opacity in the right upper lobe likely representing acute infiltrate. Due to somewhat nodular appearance of findings, recommend follow-up radiographs until resolution to exclude underlying abnormalities.  Original Report Authenticated By: Lollie Marrow, M.D.   Dg Chest 2 View  06/28/2011  *RADIOLOGY REPORT*  Clinical Data: Cough, fever  CHEST - 2 VIEW  Comparison: Chest x-ray of 04/24/2011  Findings: There is patchy opacity within the right upper lobe  suspicious for pneumonia.  The remainder of the lungs are clear and slightly hyperaerated.  Mediastinal contours are stable.  The heart is within normal limits in size.  No bony abnormality is seen.  IMPRESSION: Opacity in the right upper lobe most consistent with pneumonia. Recommend follow-up to ensure clearing.  Original Report Authenticated By: Juline Patch, M.D.     No diagnosis found.    MDM   The chart was scribed for me under my direct supervision.  I personally performed the history, physical, and medical decision making and all procedures in the evaluation of this patient.[name], MD 06/29/11 [phone]

## 2011-06-29 NOTE — ED Notes (Signed)
Pt presents to ED with left sided chest wall pain that is worse with a deep breath. Pt was in ED yesterday and diagnosed with right sided pneumonia. Pt mainly complains of the left sided pain. States pain to be a 10/10. Lung sounds are clear on the left side and slightly diminished on the right. Pateint AAx4 and NAD noted.

## 2011-08-10 ENCOUNTER — Ambulatory Visit (HOSPITAL_COMMUNITY)
Admission: RE | Admit: 2011-08-10 | Discharge: 2011-08-10 | Disposition: A | Discharge status: Home / Self Care | Payer: PRIVATE HEALTH INSURANCE | Source: Ambulatory Visit | Attending: Family Medicine | Admitting: Family Medicine

## 2011-08-10 ENCOUNTER — Other Ambulatory Visit (HOSPITAL_COMMUNITY): Payer: Self-pay | Admitting: Nurse Practitioner

## 2011-08-10 DIAGNOSIS — R05 Cough: Secondary | ICD-10-CM

## 2011-08-10 DIAGNOSIS — Z09 Encounter for follow-up examination after completed treatment for conditions other than malignant neoplasm: Secondary | ICD-10-CM

## 2011-08-10 DIAGNOSIS — F17200 Nicotine dependence, unspecified, uncomplicated: Secondary | ICD-10-CM | POA: Insufficient documentation

## 2011-08-10 DIAGNOSIS — R059 Cough, unspecified: Secondary | ICD-10-CM | POA: Insufficient documentation

## 2011-09-20 ENCOUNTER — Encounter (HOSPITAL_COMMUNITY): Payer: Self-pay | Admitting: Emergency Medicine

## 2011-09-20 ENCOUNTER — Emergency Department (HOSPITAL_COMMUNITY): Payer: Self-pay

## 2011-09-20 ENCOUNTER — Emergency Department (HOSPITAL_COMMUNITY)
Admission: EM | Admit: 2011-09-20 | Discharge: 2011-09-20 | Disposition: A | Discharge status: Home / Self Care | Payer: Self-pay | Attending: Emergency Medicine | Admitting: Emergency Medicine

## 2011-09-20 DIAGNOSIS — R079 Chest pain, unspecified: Secondary | ICD-10-CM | POA: Insufficient documentation

## 2011-09-20 DIAGNOSIS — R059 Cough, unspecified: Secondary | ICD-10-CM | POA: Insufficient documentation

## 2011-09-20 DIAGNOSIS — R071 Chest pain on breathing: Secondary | ICD-10-CM | POA: Insufficient documentation

## 2011-09-20 DIAGNOSIS — R0789 Other chest pain: Secondary | ICD-10-CM

## 2011-09-20 DIAGNOSIS — R05 Cough: Secondary | ICD-10-CM | POA: Insufficient documentation

## 2011-09-20 MED ORDER — OXYCODONE-ACETAMINOPHEN 5-325 MG PO TABS: 1.0000 | ORAL_TABLET | ORAL | Status: AC | PRN

## 2011-09-20 MED ORDER — OXYCODONE-ACETAMINOPHEN 5-325 MG PO TABS
2.0000 | ORAL_TABLET | Freq: Once | ORAL | Status: AC
  Administered 2011-09-20: 2 via ORAL
  Filled 2011-09-20: qty 2

## 2011-09-20 NOTE — ED Notes (Signed)
Pt states having pain in chest with taking a deep breath. Pt states has history of pneumonia. Has had productive cough for a week.

## 2011-09-20 NOTE — Discharge Instructions (Signed)
PLEASE CALL YOUR PRIMARY CARE DOCTOR AND HAVE A CAT SCAN OF YOUR CHEST FOR THE SPOT ON YOUR LUNG THAT WE DISCUSSED THIS COULD BE AN AREA OF CANCER THAT NEEDS TO BE CHECKED. PLEASE RETURN FOR WORSENED PAIN, SHORTNESS OF BREATH, WEAKNESS OR IF YOU PASS OUT

## 2011-09-20 NOTE — ED Provider Notes (Signed)
History   This chart was scribed for Heather Gaskins, MD by Clarita Crane. The patient was seen in room APA05/APA05. Patient's care was started at 1006.    CSN: [id number]  Arrival date & time 09/20/11  1006   First MD Initiated Contact with Patient 09/20/11 1013      Chief Complaint  Patient presents with  . Cough  . Chest Pain     HPI Heather GRONAU is a 51 y.o. female who presents to the Emergency Department complaining of constant left sided lateral chest pain onset last night and persistent since with associated waxing and waning cough which began several months ago and has been persistent since. Also notes experiencing associated chills, nausea and LUE pain with coughing. Patient reports left sided chest pain is aggravated significantly with coughing. Notes coughing recently became significantly worse after mowing the lawn several days ago. Denies fever, hemoptysis, vomiting, abdominal pain, swelling of lower extremities. Patient with h/o pneumonia, nephrectomy, abdominal hysterectomy, tubal ligation, thyroid disease. Denies h/o DVT/PE.  Past Medical History  Diagnosis Date  . Anxiety   . Panic attacks   . Thyroid disease   . Pneumonia     Past Surgical History  Procedure Date  . Nephrectomy     native   . Total abdominal hysterectomy   . Tubal ligation     Family History  Problem Relation Age of Onset  . Heart defect      family history   . Cancer      family history   . Arthritis      family history     History  Substance Use Topics  . Smoking status: Current Everyday Smoker -- 1.0 packs/day    Types: Cigarettes  . Smokeless tobacco: Not on file  . Alcohol Use: No    OB History    Grav Para Term Preterm Abortions TAB SAB Ect Mult Living                  Review of Systems A complete 10 system review of systems was obtained and all systems are negative except as noted in the HPI and PMH.   Allergies  Nsaids and Sulfa antibiotics  Home  Medications   Current Outpatient Rx  Name Route Sig Dispense Refill  . ACETAMINOPHEN 500 MG PO TABS Oral Take 1,000 mg by mouth every 6 (six) hours as needed. For pain     . ALPRAZOLAM 0.5 MG PO TABS Oral Take 0.5 mg by mouth 3 (three) times daily as needed. For anxiety    . OMEPRAZOLE 40 MG PO CPDR Oral Take 40 mg by mouth daily before breakfast.      . PROPYLTHIOURACIL 50 MG PO TABS Oral Take 50 mg by mouth 2 (two) times daily.        BP 121/97  Pulse 90  Ht 5\' 5"  (1.651 m)  Wt 100 lb (45.36 kg)  BMI 16.64 kg/m2  SpO2 97%  Physical Exam CONSTITUTIONAL: Well developed/well nourished HEAD AND FACE: Normocephalic/atraumatic EYES: EOMI/PERRL ENMT: Mucous membranes moist NECK: supple no meningeal signs SPINE:entire spine nontender CV: S1/S2 noted, no murmurs/rubs/gallops noted LUNGS: Lungs are clear to auscultation bilaterally, no apparent distress, tenderness to left lateral chest wall, no crepitance noted ABDOMEN: soft, nontender, no rebound or guarding NEURO: Pt is awake/alert, moves all extremitiesx4 EXTREMITIES: pulses normal, full ROM, no edema SKIN: warm, color normal PSYCH: no abnormalities of mood noted  ED Course  Procedures   DIAGNOSTIC STUDIES: Oxygen  Saturation is 97% on room air, normal by my interpretation.    COORDINATION OF CARE: 10:22AM-Patient informed of current plan for treatment and evaluation and agrees with plan at this time. Will obtain CXR.   Labs Reviewed - No data to display Dg Ribs Unilateral W/chest Left  09/20/2011  *RADIOLOGY REPORT*  Clinical Data: Cough and left lateral rib pain.  LEFT RIBS AND CHEST - 3+ VIEW  Comparison: Chest radiograph 08/10/2011  Findings: Again noted are scattered densities throughout the lungs, most of which appear chronic.  However, there is concern for a vague density in the left lower lung, best seen on the oblique rib image.  Heart and mediastinum are within normal limits.  There is no evidence for pneumothorax.   No evidence for a displaced left rib fracture.  IMPRESSION: Concern for a lesion or density in the left lower chest which may be near the anterior chest wall.  Recommend further characterization with a chest CT.  No evidence for a rib fracture.  These results were called by telephone on 09/20/2011  at  11:24 a.m. to  Dr. Bebe Shaggy, who verbally acknowledged these results.  Original Report Authenticated By: Richarda Overlie, M.D.    D/w radiology dr Lowella Dandy He feels pt should have non-emergent CT chest to evaluate lesion on CXR given persistent pain Unable to obtain CT imaging today Pt has PCP and will f/u in the next 2 weeks for CT imaging and I informed her this could be cancer Otherwise stable for d/c home.  She is feeling improved   MDM  Nursing notes including past medical history and social history reviewed and considered in documentation xrays reviewed and considered Previous records reviewed and considered - multiple CXR in the past Discussed with radiology    Date: 09/20/2011  Rate: 88  Rhythm: normal sinus rhythm  QRS Axis: normal  Intervals: normal  ST/T Wave abnormalities: nonspecific ST changes  Conduction Disutrbances:none  Narrative Interpretation:   Old EKG Reviewed: unchanged      I personally performed the services described in this documentation, which was scribed in my presence. The recorded information has been reviewed and considered.      Heather Gaskins, MD 09/20/11 [phone]

## 2011-09-21 ENCOUNTER — Other Ambulatory Visit (HOSPITAL_COMMUNITY): Payer: Self-pay | Admitting: Nurse Practitioner

## 2011-09-21 DIAGNOSIS — R9389 Abnormal findings on diagnostic imaging of other specified body structures: Secondary | ICD-10-CM

## 2011-09-27 ENCOUNTER — Other Ambulatory Visit (HOSPITAL_COMMUNITY): Payer: Self-pay | Admitting: Nurse Practitioner

## 2011-09-27 ENCOUNTER — Ambulatory Visit (HOSPITAL_COMMUNITY)
Admission: RE | Admit: 2011-09-27 | Discharge: 2011-09-27 | Disposition: A | Discharge status: Home / Self Care | Payer: Self-pay | Source: Ambulatory Visit | Attending: Nurse Practitioner | Admitting: Nurse Practitioner

## 2011-09-27 DIAGNOSIS — R059 Cough, unspecified: Secondary | ICD-10-CM | POA: Insufficient documentation

## 2011-09-27 DIAGNOSIS — F17200 Nicotine dependence, unspecified, uncomplicated: Secondary | ICD-10-CM | POA: Insufficient documentation

## 2011-09-27 DIAGNOSIS — R9389 Abnormal findings on diagnostic imaging of other specified body structures: Secondary | ICD-10-CM

## 2011-09-27 DIAGNOSIS — R918 Other nonspecific abnormal finding of lung field: Secondary | ICD-10-CM | POA: Insufficient documentation

## 2011-09-27 DIAGNOSIS — R05 Cough: Secondary | ICD-10-CM | POA: Insufficient documentation

## 2011-10-04 ENCOUNTER — Other Ambulatory Visit (HOSPITAL_COMMUNITY): Payer: PRIVATE HEALTH INSURANCE

## 2012-01-12 ENCOUNTER — Emergency Department (HOSPITAL_COMMUNITY): Payer: Self-pay

## 2012-01-12 ENCOUNTER — Encounter (HOSPITAL_COMMUNITY): Payer: Self-pay | Admitting: Emergency Medicine

## 2012-01-12 ENCOUNTER — Emergency Department (HOSPITAL_COMMUNITY)
Admission: EM | Admit: 2012-01-12 | Discharge: 2012-01-12 | Disposition: A | Discharge status: Home / Self Care | Payer: Self-pay | Attending: Emergency Medicine | Admitting: Emergency Medicine

## 2012-01-12 DIAGNOSIS — F411 Generalized anxiety disorder: Secondary | ICD-10-CM | POA: Insufficient documentation

## 2012-01-12 DIAGNOSIS — R109 Unspecified abdominal pain: Secondary | ICD-10-CM | POA: Insufficient documentation

## 2012-01-12 DIAGNOSIS — F17200 Nicotine dependence, unspecified, uncomplicated: Secondary | ICD-10-CM | POA: Insufficient documentation

## 2012-01-12 DIAGNOSIS — M542 Cervicalgia: Secondary | ICD-10-CM | POA: Insufficient documentation

## 2012-01-12 DIAGNOSIS — J189 Pneumonia, unspecified organism: Secondary | ICD-10-CM | POA: Insufficient documentation

## 2012-01-12 DIAGNOSIS — R51 Headache: Secondary | ICD-10-CM | POA: Insufficient documentation

## 2012-01-12 DIAGNOSIS — R079 Chest pain, unspecified: Secondary | ICD-10-CM | POA: Insufficient documentation

## 2012-01-12 MED ORDER — LIDOCAINE HCL (PF) 1 % IJ SOLN
INTRAMUSCULAR | Status: AC
  Administered 2012-01-12: 2.1 mL
  Filled 2012-01-12: qty 5

## 2012-01-12 MED ORDER — MORPHINE SULFATE 4 MG/ML IJ SOLN: 8.0000 mg | Freq: Once | INTRAMUSCULAR | Status: AC

## 2012-01-12 MED ORDER — PROMETHAZINE HCL 12.5 MG PO TABS
12.5000 mg | ORAL_TABLET | Freq: Once | ORAL | Status: AC
  Administered 2012-01-12: 12.5 mg via ORAL
  Filled 2012-01-12: qty 1

## 2012-01-12 MED ORDER — AZITHROMYCIN 250 MG PO TABS
500.0000 mg | ORAL_TABLET | Freq: Once | ORAL | Status: AC
  Administered 2012-01-12: 500 mg via ORAL
  Filled 2012-01-12: qty 2

## 2012-01-12 MED ORDER — HYDROCODONE-ACETAMINOPHEN 7.5-325 MG PO TABS: 1.0000 | ORAL_TABLET | ORAL | Status: DC | PRN

## 2012-01-12 MED ORDER — CEFTRIAXONE SODIUM 1 G IJ SOLR
1.0000 g | Freq: Once | INTRAMUSCULAR | Status: AC
  Administered 2012-01-12: 1 g via INTRAMUSCULAR
  Filled 2012-01-12: qty 10

## 2012-01-12 MED ORDER — AZITHROMYCIN 250 MG PO TABS: ORAL_TABLET | ORAL | Status: DC

## 2012-01-12 MED ORDER — MORPHINE SULFATE 10 MG/ML IJ SOLN
INTRAMUSCULAR | Status: AC
  Administered 2012-01-12: 8 mg
  Filled 2012-01-12: qty 1

## 2012-01-12 NOTE — ED Provider Notes (Signed)
Medical screening examination/treatment/procedure(s) were performed by non-physician practitioner and as supervising physician I was immediately available for consultation/collaboration.   [name], MD 01/12/12 1212

## 2012-01-12 NOTE — ED Notes (Signed)
Pt c/o rt shoulder pain that radiates to the upper back since Wednesday. Pt also c/o rt upper abd pain.

## 2012-01-12 NOTE — ED Provider Notes (Signed)
History     CSN: [id number]  Arrival date & time 01/12/12  0941   First MD Initiated Contact with Patient 01/12/12 1007      Chief Complaint  Patient presents with  . Shoulder Pain  . Neck Pain  . Headache  . Abdominal Pain    (Consider location/radiation/quality/duration/timing/severity/associated sxs/prior treatment) Patient is a 51 y.o. female presenting with shoulder pain, neck pain, headaches, and abdominal pain. The history is provided by the patient.  Shoulder Pain Associated symptoms include abdominal pain, headaches and neck pain. Nothing aggravates the symptoms. Treatments tried: arthritis cream. The treatment provided no relief.  Neck Pain  Associated symptoms include headaches.  Headache   Abdominal Pain The primary symptoms of the illness include abdominal pain.    Past Medical History  Diagnosis Date  . Anxiety   . Panic attacks   . Thyroid disease   . Pneumonia     Past Surgical History  Procedure Date  . Nephrectomy     native   . Total abdominal hysterectomy   . Tubal ligation     Family History  Problem Relation Age of Onset  . Heart defect      family history   . Cancer      family history   . Arthritis      family history     History  Substance Use Topics  . Smoking status: Current Every Day Smoker -- 1.0 packs/day    Types: Cigarettes  . Smokeless tobacco: Not on file  . Alcohol Use: No    OB History    Grav Para Term Preterm Abortions TAB SAB Ect Mult Living                  Review of Systems  HENT: Positive for neck pain.   Gastrointestinal: Positive for abdominal pain.  Neurological: Positive for headaches.    Allergies  Nsaids and Sulfa antibiotics  Home Medications   Current Outpatient Rx  Name Route Sig Dispense Refill  . ACETAMINOPHEN 500 MG PO TABS Oral Take 1,000 mg by mouth every 6 (six) hours as needed. For pain     . ALPRAZOLAM 0.5 MG PO TABS Oral Take 0.5 mg by mouth 3 (three) times daily as needed.  For anxiety    . OMEPRAZOLE 40 MG PO CPDR Oral Take 40 mg by mouth daily before breakfast.      . PROPYLTHIOURACIL 50 MG PO TABS Oral Take 50 mg by mouth 2 (two) times daily.        BP 98/58  Pulse 85  Temp 98.9 F (37.2 C) (Oral)  Resp 18  Ht 5\' 4"  (1.626 m)  Wt 103 lb (46.72 kg)  BMI 17.68 kg/m2  SpO2 96%  Physical Exam  Nursing note and vitals reviewed. Constitutional: She is oriented to person, place, and time. She appears well-developed and well-nourished.  Non-toxic appearance.  HENT:  Head: Normocephalic.  Right Ear: Tympanic membrane and external ear normal.  Left Ear: Tympanic membrane and external ear normal.  Eyes: EOM and lids are normal. Pupils are equal, round, and reactive to light.  Neck: Normal range of motion. Neck supple. Carotid bruit is not present.       There is soreness with range of motion of the neck with extension into the right and left shoulder. Right greater than left.  Cardiovascular: Normal rate, regular rhythm, normal heart sounds, intact distal pulses and normal pulses.   Pulmonary/Chest: Breath sounds normal. No respiratory distress.  There is right lateral chest wall tenderness extending into the anterior chest wall just under the right breast area. There is no increased redness or rash appreciated. There are coarse breath sounds noted on the right. No focal consolidation. There is symmetrical rise and fall of the chest, but with pain on the right. Patient speaks in complete sentences.  Abdominal: Soft. Bowel sounds are normal. There is no tenderness. There is no guarding.  Musculoskeletal: Normal range of motion.       There is pain of the right shoulder with attempted range of motion. There is no evidence of dislocation. There is no effusion noted of the joint. There is full range of motion of the right elbow and wrist. Is full range of motion of the fingers of the right hand. Capillary refill is less than 3 seconds. The radial pulses are 2+  and symmetrical. There is no atrophy of the biceps triceps area neither of the thenar eminence areas on the right or the left.  Lymphadenopathy:       Head (right side): No submandibular adenopathy present.       Head (left side): No submandibular adenopathy present.    She has no cervical adenopathy.  Neurological: She is alert and oriented to person, place, and time. She has normal strength. No cranial nerve deficit or sensory deficit. She exhibits normal muscle tone. Coordination normal.       No motor or sensory deficits of the upper extremity.  Skin: Skin is warm and dry.  Psychiatric: Her speech is normal. Her mood appears anxious.    ED Course  Procedures (including critical care time)  Labs Reviewed - No data to display No results found. EKG 11:18. Rate 74. Rhythm-normal sinus rhythm. Axis-normal. PR-normal. QRS-normal. ST-T-normal. No life-threatening arrhythmia noted. No STEMI.  No diagnosis found.    MDM  I have reviewed nursing notes, vital signs, and all appropriate lab and imaging results for this patient. X-ray of the right ribs and chest reveals increasing right basilar airspace disease concerning for pneumonia. There is persistent but improving left basilar opacity. The x-ray of the cervical spine shows no fracture. The disc spaces well maintained and is deemed an unremarkable study. The patient is treated with intramuscular Rocephin, and oral Zithromax. Prescription for Zithromax and Norco 7.5 mg given. Patient is also given a incentive spirometer to use. At the patient's request the result of the chest x-ray was faxed to her pulmonary specialist at [facility].       Lyla Son [place], [place] 01/12/12 [phone]

## 2012-04-01 ENCOUNTER — Emergency Department (HOSPITAL_COMMUNITY): Payer: Medicaid Other

## 2012-04-01 ENCOUNTER — Emergency Department (HOSPITAL_COMMUNITY)
Admission: EM | Admit: 2012-04-01 | Discharge: 2012-04-01 | Disposition: A | Discharge status: Home / Self Care | Payer: Medicaid Other | Attending: Emergency Medicine | Admitting: Emergency Medicine

## 2012-04-01 ENCOUNTER — Encounter (HOSPITAL_COMMUNITY): Payer: Self-pay

## 2012-04-01 DIAGNOSIS — R1013 Epigastric pain: Secondary | ICD-10-CM | POA: Insufficient documentation

## 2012-04-01 DIAGNOSIS — M549 Dorsalgia, unspecified: Secondary | ICD-10-CM | POA: Insufficient documentation

## 2012-04-01 DIAGNOSIS — R0602 Shortness of breath: Secondary | ICD-10-CM | POA: Insufficient documentation

## 2012-04-01 DIAGNOSIS — F17200 Nicotine dependence, unspecified, uncomplicated: Secondary | ICD-10-CM | POA: Insufficient documentation

## 2012-04-01 DIAGNOSIS — E079 Disorder of thyroid, unspecified: Secondary | ICD-10-CM | POA: Insufficient documentation

## 2012-04-01 DIAGNOSIS — F411 Generalized anxiety disorder: Secondary | ICD-10-CM | POA: Insufficient documentation

## 2012-04-01 DIAGNOSIS — Z8701 Personal history of pneumonia (recurrent): Secondary | ICD-10-CM | POA: Insufficient documentation

## 2012-04-01 DIAGNOSIS — Z79899 Other long term (current) drug therapy: Secondary | ICD-10-CM | POA: Insufficient documentation

## 2012-04-01 DIAGNOSIS — R110 Nausea: Secondary | ICD-10-CM | POA: Insufficient documentation

## 2012-04-01 DIAGNOSIS — Z9089 Acquired absence of other organs: Secondary | ICD-10-CM | POA: Insufficient documentation

## 2012-04-01 DIAGNOSIS — R072 Precordial pain: Secondary | ICD-10-CM | POA: Insufficient documentation

## 2012-04-01 DIAGNOSIS — F410 Panic disorder [episodic paroxysmal anxiety] without agoraphobia: Secondary | ICD-10-CM | POA: Insufficient documentation

## 2012-04-01 DIAGNOSIS — R079 Chest pain, unspecified: Secondary | ICD-10-CM

## 2012-04-01 LAB — CBC
HCT: 43.8 % (ref 36.0–46.0)
Hemoglobin: 14.9 g/dL (ref 12.0–15.0)
MCH: 29.2 pg (ref 26.0–34.0)
MCHC: 34 g/dL (ref 30.0–36.0)
MCV: 85.7 fL (ref 78.0–100.0)
Platelets: 231 10*3/uL (ref 150–400)
RBC: 5.11 MIL/uL (ref 3.87–5.11)
RDW: 13.8 % (ref 11.5–15.5)
WBC: 7.5 10*3/uL (ref 4.0–10.5)

## 2012-04-01 LAB — COMPREHENSIVE METABOLIC PANEL
ALT: 10 U/L (ref 0–35)
AST: 18 U/L (ref 0–37)
Albumin: 3.9 g/dL (ref 3.5–5.2)
Alkaline Phosphatase: 114 U/L (ref 39–117)
BUN: 10 mg/dL (ref 6–23)
CO2: 30 mEq/L (ref 19–32)
Calcium: 9.6 mg/dL (ref 8.4–10.5)
Chloride: 99 mEq/L (ref 96–112)
Creatinine, Ser: 0.88 mg/dL (ref 0.50–1.10)
GFR calc Af Amer: 87 mL/min — ABNORMAL LOW (ref >90)
GFR calc non Af Amer: 75 mL/min — ABNORMAL LOW (ref >90)
Glucose, Bld: 108 mg/dL — ABNORMAL HIGH (ref 70–99)
Potassium: 3.9 mEq/L (ref 3.5–5.1)
Sodium: 138 mEq/L (ref 135–145)
Total Bilirubin: 0.2 mg/dL — ABNORMAL LOW (ref 0.3–1.2)
Total Protein: 7.5 g/dL (ref 6.0–8.3)

## 2012-04-01 LAB — URINALYSIS, ROUTINE W REFLEX MICROSCOPIC
Bilirubin Urine: NEGATIVE
Glucose, UA: NEGATIVE mg/dL
Hgb urine dipstick: NEGATIVE
Ketones, ur: NEGATIVE mg/dL
Leukocytes, UA: NEGATIVE
Nitrite: NEGATIVE
Protein, ur: NEGATIVE mg/dL
Specific Gravity, Urine: 1.01 (ref 1.005–1.030)
Urobilinogen, UA: 0.2 mg/dL (ref 0.0–1.0)
pH: 5.5 (ref 5.0–8.0)

## 2012-04-01 LAB — LIPASE, BLOOD: Lipase: 40 U/L (ref 11–59)

## 2012-04-01 LAB — TROPONIN I: Troponin I: <0.3 ng/mL (ref <0.30)

## 2012-04-01 MED ORDER — SODIUM CHLORIDE 0.9 % IV BOLUS (SEPSIS)
1000.0000 mL | Freq: Once | INTRAVENOUS | Status: AC
  Administered 2012-04-01: 1000 mL via INTRAVENOUS

## 2012-04-01 MED ORDER — LORAZEPAM 1 MG PO TABS
1.0000 mg | ORAL_TABLET | Freq: Once | ORAL | Status: AC
  Administered 2012-04-01: 1 mg via ORAL
  Filled 2012-04-01: qty 1

## 2012-04-01 MED ORDER — GI COCKTAIL ~~LOC~~
30.0000 mL | Freq: Once | ORAL | Status: DC
  Filled 2012-04-01: qty 30

## 2012-04-01 MED ORDER — FAMOTIDINE 20 MG PO TABS: 20.0000 mg | ORAL_TABLET | Freq: Two times a day (BID) | ORAL | Status: DC

## 2012-04-01 NOTE — ED Notes (Signed)
Pt reports mid sternal chest pain for the past few days that radiates to her back.  Pt reports "i think i have pneumonia".

## 2012-04-01 NOTE — ED Notes (Signed)
Pt c/o midsternal and rib pain x4-5 days. Pt also reports numbness in left shoulder/arm. Pt denies SOB, NVD. Pt states "I have a history of pneumonia and I'm that's what it is".

## 2012-04-01 NOTE — ED Provider Notes (Signed)
History   This chart was scribed for Raeford Razor, MD by Charolett Bumpers, ED Scribe. The patient was seen in room APA09/APA09. Patient's care was started at 1355.   CSN: [id number]  Arrival date & time 04/01/12  1322   First MD Initiated Contact with Patient 04/01/12 1355      Chief Complaint  Patient presents with  . Chest Pain    The history is provided by the patient. No language interpreter was used.   Heather Wang is a 52 y.o. female who presents to the Emergency Department complaining of intermittent, substernal chest pain that radiates around her ribcage to her back that started 4-5 days ago. She reports associated nausea, SOB and numbness in her left arm. She reports aggravation of her symptoms with laying flat. She denies any vomiting.  She states that she first thought it was acid reflux, took some Tums with some relief. She has a h/o pneumonia and has a specialist at [facility]. She denies any leg swelling, dysuria, frequency or hematuria. She denies any h/o blood clots. She states she has never had a stress test. She has had a nephrectomy. She is a smoker. She denies alcohol use.  Past Medical History  Diagnosis Date  . Anxiety   . Panic attacks   . Thyroid disease   . Pneumonia     Past Surgical History  Procedure Date  . Nephrectomy     native   . Total abdominal hysterectomy   . Tubal ligation     Family History  Problem Relation Age of Onset  . Heart defect      family history   . Cancer      family history   . Arthritis      family history     History  Substance Use Topics  . Smoking status: Current Every Day Smoker -- 1.0 packs/day    Types: Cigarettes  . Smokeless tobacco: Not on file  . Alcohol Use: No    OB History    Grav Para Term Preterm Abortions TAB SAB Ect Mult Living                  Review of Systems  Respiratory: Positive for shortness of breath.   Cardiovascular: Positive for chest pain. Negative for leg swelling.    Gastrointestinal: Positive for nausea. Negative for vomiting.  Genitourinary: Negative for dysuria, frequency and hematuria.  All other systems reviewed and are negative.    Allergies  Nsaids and Sulfa antibiotics  Home Medications   Current Outpatient Rx  Name  Route  Sig  Dispense  Refill  . ACETAMINOPHEN 500 MG PO TABS   Oral   Take 1,000 mg by mouth every 6 (six) hours as needed. For pain          . ALPRAZOLAM 1 MG PO TABS   Oral   Take 1 mg by mouth 3 (three) times daily.         . AZITHROMYCIN 250 MG PO TABS      take 1 every day until finished. Start oct 19.   4 tablet   0   . HYDROCODONE-ACETAMINOPHEN 7.5-325 MG PO TABS   Oral   Take 1 tablet by mouth every 4 (four) hours as needed for pain.   20 tablet   0   . OMEPRAZOLE 40 MG PO CPDR   Oral   Take 40 mg by mouth daily before breakfast.           .  PROPYLTHIOURACIL 50 MG PO TABS   Oral   Take 50 mg by mouth 2 (two) times daily.             BP 109/46  Pulse 77  Temp 98 F (36.7 C) (Oral)  Resp 20  Ht 5\' 4"  (1.626 m)  Wt 105 lb (47.628 kg)  BMI 18.02 kg/m2  SpO2 98%  Physical Exam  Nursing note and vitals reviewed. Constitutional: She is oriented to person, place, and time. She appears well-developed and well-nourished. No distress.  HENT:  Head: Normocephalic and atraumatic.  Eyes: EOM are normal. Pupils are equal, round, and reactive to light.  Neck: Normal range of motion. Neck supple. No tracheal deviation present.  Cardiovascular: Normal rate, regular rhythm and normal heart sounds.   Pulmonary/Chest: Effort normal and breath sounds normal. No respiratory distress. She exhibits no tenderness.  Abdominal: Soft. She exhibits no distension. There is tenderness. There is CVA tenderness.       Tenderness in the epigastrium along the xiphoid process. Right CVA tenderness noted.   Musculoskeletal: Normal range of motion. She exhibits no edema and no tenderness.       No calf tenderness  of lower extremity edema.   Neurological: She is alert and oriented to person, place, and time.  Skin: Skin is warm and dry.  Psychiatric: She has a normal mood and affect. Her behavior is normal.    ED Course  Procedures (including critical care time)  DIAGNOSTIC STUDIES: Oxygen Saturation is 98% on room air, normal by my interpretation.    COORDINATION OF CARE:  14:03-Discussed planned course of treatment with the patient including a chest x-ray, IV fluids, GI cocktail, Ativan and basic blood and urine screening, who is agreeable at this time.   14:30-Medication Orders: Sodium chloride 0.9% bolus 1,000 mL-once; GI cocktail (Maalox, Lidocaine, Donnatal)-once; Lorazepam (Ativan) tablet 1 mg-once.   Labs Reviewed  COMPREHENSIVE METABOLIC PANEL - Abnormal; Notable for the following:    Glucose, Bld 108 (*)     Total Bilirubin 0.2 (*)     GFR calc non Af Amer 75 (*)     GFR calc Af Amer 87 (*)     All other components within normal limits  TROPONIN I  CBC  URINALYSIS, ROUTINE W REFLEX MICROSCOPIC  LIPASE, BLOOD   Dg Chest 2 View  04/01/2012  *RADIOLOGY REPORT*  Clinical Data: Chest pain.  CHEST - 2 VIEW  Comparison: 01/12/2012  Findings: Heart is normal size.  Mild hyperinflation of the lungs. No confluent opacities.  No effusions.  No acute bony abnormality. Nipple shadows project over the  lower lung fields.  IMPRESSION: No acute cardiopulmonary disease.  Hyperexpansion.   Original Report Authenticated By: Charlett Nose, M.D.     EKG:  Rhythm: Vent. rate 84 BPM PR interval 144 ms QRS duration 90 ms QT/QTc 378/446 ms ST segments: Nonspecific ST changes. There is T-wave flattening inferiorly in the precordial leads. Comparison: Stable from previous EKG from 01/12/2012   1. Chest pain       MDM  52 year old female with epigastric/lower chest pain which radiates to bilateral flank and back. Atypical for ACS. No pulsating abdominal mass. Doubt aortic dissection. Troponin is  normal. EKG is stable from previous. Possible PUD. Lipase is normal. Patient has not peritoneal. Feel she is safe for discharge at this time. We'll give a trial of H2 blocker. Emergent return precautions were discussed. Patient encouraged to stop smoking.   I personally preformed the services scribed  in my presence. The recorded information has been reviewed is accurate. Raeford Razor, MD.       Raeford Razor, MD 04/06/12 [phone]

## 2012-04-04 ENCOUNTER — Emergency Department (HOSPITAL_COMMUNITY)
Admission: EM | Admit: 2012-04-04 | Discharge: 2012-04-04 | Disposition: A | Discharge status: Home / Self Care | Payer: Medicaid Other | Attending: Emergency Medicine | Admitting: Emergency Medicine

## 2012-04-04 ENCOUNTER — Encounter (HOSPITAL_COMMUNITY): Payer: Self-pay | Admitting: *Deleted

## 2012-04-04 ENCOUNTER — Other Ambulatory Visit: Payer: Self-pay

## 2012-04-04 DIAGNOSIS — Z8701 Personal history of pneumonia (recurrent): Secondary | ICD-10-CM | POA: Insufficient documentation

## 2012-04-04 DIAGNOSIS — F17200 Nicotine dependence, unspecified, uncomplicated: Secondary | ICD-10-CM | POA: Insufficient documentation

## 2012-04-04 DIAGNOSIS — Z862 Personal history of diseases of the blood and blood-forming organs and certain disorders involving the immune mechanism: Secondary | ICD-10-CM | POA: Insufficient documentation

## 2012-04-04 DIAGNOSIS — M79603 Pain in arm, unspecified: Secondary | ICD-10-CM

## 2012-04-04 DIAGNOSIS — Z79899 Other long term (current) drug therapy: Secondary | ICD-10-CM | POA: Insufficient documentation

## 2012-04-04 DIAGNOSIS — F411 Generalized anxiety disorder: Secondary | ICD-10-CM | POA: Insufficient documentation

## 2012-04-04 DIAGNOSIS — M79609 Pain in unspecified limb: Secondary | ICD-10-CM | POA: Insufficient documentation

## 2012-04-04 DIAGNOSIS — R209 Unspecified disturbances of skin sensation: Secondary | ICD-10-CM | POA: Insufficient documentation

## 2012-04-04 DIAGNOSIS — F410 Panic disorder [episodic paroxysmal anxiety] without agoraphobia: Secondary | ICD-10-CM | POA: Insufficient documentation

## 2012-04-04 DIAGNOSIS — F419 Anxiety disorder, unspecified: Secondary | ICD-10-CM

## 2012-04-04 DIAGNOSIS — Z8639 Personal history of other endocrine, nutritional and metabolic disease: Secondary | ICD-10-CM | POA: Insufficient documentation

## 2012-04-04 LAB — POCT I-STAT TROPONIN I: Troponin i, poc: 0 ng/mL (ref 0.00–0.08)

## 2012-04-04 MED ORDER — LORAZEPAM 1 MG PO TABS
1.0000 mg | ORAL_TABLET | Freq: Once | ORAL | Status: AC
  Administered 2012-04-04: 1 mg via ORAL
  Filled 2012-04-04: qty 1

## 2012-04-04 NOTE — ED Notes (Signed)
Pt reports pain to her left arm. Was seen here for chest pain a few days ago. Dx w/ gerd. Pt states left arm pain started around 0400 this morning. Pain comes & goes, pt denies any new activity or injury.

## 2012-04-04 NOTE — ED Provider Notes (Signed)
History   This chart was scribed for Heather Gaskins, MD, by Frederik Pear, ER scribe. The patient was seen in room APA07/APA07 and the patient's care was started at 2142.    CSN: [id number]  Arrival date & time 04/04/12  1800   First MD Initiated Contact with Patient 04/04/12 2142      Chief Complaint  Patient presents with  . Arm Pain     HPI  Heather Wang is a 52 y.o. female who presents to the Emergency Department complaining of waxing and waning, moderate, achy left arm pain that began at the should and radiates down to her hand with associated paresthesia of the left fingers that began at 4 am. She also complains of mild centralized back pain and anxiety. She reports that she was seen in the ED on 01/06 for chest pain and diagnosed with GERD. She reports that she filled the prescription of pepcid yesterday, which has provided relief of the chest pain.She denies any weakness, swelling, or discoloration to the left hand, headache, or neck pain. She has no h/o of MI, but she has a family h/o of cardiac disease.    Past Medical History  Diagnosis Date  . Anxiety   . Panic attacks   . Thyroid disease   . Pneumonia     Past Surgical History  Procedure Date  . Nephrectomy     native   . Total abdominal hysterectomy   . Tubal ligation     Family History  Problem Relation Age of Onset  . Heart defect      family history   . Cancer      family history   . Arthritis      family history     History  Substance Use Topics  . Smoking status: Current Every Day Smoker -- 1.0 packs/day    Types: Cigarettes  . Smokeless tobacco: Not on file  . Alcohol Use: No    OB History    Grav Para Term Preterm Abortions TAB SAB Ect Mult Living                  Review of Systems  Constitutional: Negative for fever and diaphoresis.  HENT: Negative for neck pain.   Respiratory: Negative for shortness of breath.   Gastrointestinal: Negative for vomiting.  Musculoskeletal:  Negative for back pain.       Arm pain  Neurological: Positive for numbness. Negative for weakness and headaches.  All other systems reviewed and are negative.    Allergies  Nsaids and Sulfa antibiotics  Home Medications   Current Outpatient Rx  Name  Route  Sig  Dispense  Refill  . ACETAMINOPHEN 500 MG PO TABS   Oral   Take 1,000 mg by mouth every 6 (six) hours as needed. For pain          . ALPRAZOLAM 1 MG PO TABS   Oral   Take 1 mg by mouth 3 (three) times daily.         . AZITHROMYCIN 250 MG PO TABS      take 1 every day until finished. Start oct 19.   4 tablet   0   . FAMOTIDINE 20 MG PO TABS   Oral   Take 1 tablet (20 mg total) by mouth 2 (two) times daily.   60 tablet   0   . OMEPRAZOLE 40 MG PO CPDR   Oral   Take 40 mg by mouth daily  before breakfast.           . PROPYLTHIOURACIL 50 MG PO TABS   Oral   Take 50 mg by mouth 2 (two) times daily.             BP 111/57  Pulse 78  Temp 97.9 F (36.6 C) (Oral)  Resp 20  Ht 5\' 4"  (1.626 m)  Wt 105 lb (47.628 kg)  BMI 18.02 kg/m2  SpO2 99%  Physical Exam  CONSTITUTIONAL: Well developed/well nourished, anxious HEAD AND FACE: Normocephalic/atraumatic EYES: EOMI/PERRL ENMT: Mucous membranes moist NECK: supple no meningeal signs SPINE:entire spine nontender CV: S1/S2 noted, no murmurs/rubs/gallops noted LUNGS: Lungs are clear to auscultation bilaterally, no apparent distress ABDOMEN: soft, nontender, no rebound or guarding GU:no cva tenderness NEURO: Pt is awake/alert, moves all extremities x4, Equal power with hand grip, wrist flex/extension, elbow flex/extension, and equal power with shoulder abduction/adduction.  No focal sensory deficit is noted in either UE.   Equal biceps/brachioradial reflex in bilateral UE EXTREMITIES: pulses normal, full ROM, tenderness to the left shoulder and elbow, but no deformity. No erythema/edema noted to either extremity.   SKIN: warm, color normal PSYCH:  anxious  ED Course  Procedures   DIAGNOSTIC STUDIES: Oxygen Saturation is 99% on room air, normal by my interpretation.    COORDINATION OF CARE:  22:15- Discussed planned course of treatment with the patient, including Ativan, who is agreeable at this time.  22:30- Medication Orders- lorazepam (ativan) tablet 1 mg- once.  Results for orders placed during the hospital encounter of 04/04/12  POCT I-STAT TROPONIN I      Component Value Range   Troponin i, poc 0.00  0.00 - 0.08 ng/mL   Comment 3             Pt reported left arm pain . No signs of trauma/DVT/infectious or vascular etiology.  She is well appearing but is anxious.  Reports argument with husband that worsened symptoms.  She feels safe at home.  She reports her chest pain has been improving with the pepcid.  Suspicion for ACS is low.     MDM  Nursing notes including past medical history and social history reviewed and considered in documentation Previous records reviewed and considered      Date: 04/04/2012  Rate: 75  Rhythm: normal sinus rhythm  QRS Axis: normal  Intervals: normal  ST/T Wave abnormalities: nonspecific ST changes  Conduction Disutrbances:none  Narrative Interpretation:   Old EKG Reviewed: unchanged    I personally performed the services described in this documentation, which was scribed in my presence. The recorded information has been reviewed and is accurate.          Heather Gaskins, MD 04/04/12 [phone]

## 2012-04-04 NOTE — ED Notes (Signed)
Pain , tingling lt arm,  Seen here 1/6 for chest pain.  No injury. Alert.

## 2012-04-04 NOTE — ED Notes (Signed)
Pt alert & oriented x4, stable gait. Patient given discharge instructions, paperwork & prescription(s). Patient  instructed to stop at the registration desk to finish any additional paperwork. Patient verbalized understanding. Pt left department w/ no further questions. 

## 2012-05-10 ENCOUNTER — Encounter (HOSPITAL_COMMUNITY): Payer: Self-pay | Admitting: *Deleted

## 2012-05-10 ENCOUNTER — Emergency Department (HOSPITAL_COMMUNITY): Payer: Medicaid Other

## 2012-05-10 ENCOUNTER — Emergency Department (HOSPITAL_COMMUNITY)
Admission: EM | Admit: 2012-05-10 | Discharge: 2012-05-10 | Disposition: A | Discharge status: Home / Self Care | Payer: Medicaid Other | Attending: Emergency Medicine | Admitting: Emergency Medicine

## 2012-05-10 DIAGNOSIS — R5381 Other malaise: Secondary | ICD-10-CM | POA: Insufficient documentation

## 2012-05-10 DIAGNOSIS — IMO0001 Reserved for inherently not codable concepts without codable children: Secondary | ICD-10-CM | POA: Insufficient documentation

## 2012-05-10 DIAGNOSIS — Z79899 Other long term (current) drug therapy: Secondary | ICD-10-CM | POA: Insufficient documentation

## 2012-05-10 DIAGNOSIS — Z862 Personal history of diseases of the blood and blood-forming organs and certain disorders involving the immune mechanism: Secondary | ICD-10-CM | POA: Insufficient documentation

## 2012-05-10 DIAGNOSIS — R05 Cough: Secondary | ICD-10-CM | POA: Insufficient documentation

## 2012-05-10 DIAGNOSIS — J209 Acute bronchitis, unspecified: Secondary | ICD-10-CM | POA: Insufficient documentation

## 2012-05-10 DIAGNOSIS — F17200 Nicotine dependence, unspecified, uncomplicated: Secondary | ICD-10-CM | POA: Insufficient documentation

## 2012-05-10 DIAGNOSIS — Z8639 Personal history of other endocrine, nutritional and metabolic disease: Secondary | ICD-10-CM | POA: Insufficient documentation

## 2012-05-10 DIAGNOSIS — R509 Fever, unspecified: Secondary | ICD-10-CM | POA: Insufficient documentation

## 2012-05-10 DIAGNOSIS — J40 Bronchitis, not specified as acute or chronic: Secondary | ICD-10-CM

## 2012-05-10 DIAGNOSIS — F411 Generalized anxiety disorder: Secondary | ICD-10-CM | POA: Insufficient documentation

## 2012-05-10 DIAGNOSIS — R059 Cough, unspecified: Secondary | ICD-10-CM | POA: Insufficient documentation

## 2012-05-10 DIAGNOSIS — Z8719 Personal history of other diseases of the digestive system: Secondary | ICD-10-CM | POA: Insufficient documentation

## 2012-05-10 MED ORDER — AZITHROMYCIN 250 MG PO TABS: 250.0000 mg | ORAL_TABLET | Freq: Every day | ORAL | Status: DC

## 2012-05-10 MED ORDER — BENZONATATE 100 MG PO CAPS: 100.0000 mg | ORAL_CAPSULE | Freq: Three times a day (TID) | ORAL | Status: DC

## 2012-05-10 MED ORDER — ALBUTEROL SULFATE HFA 108 (90 BASE) MCG/ACT IN AERS: 2.0000 | INHALATION_SPRAY | RESPIRATORY_TRACT | Status: DC | PRN

## 2012-05-10 MED ORDER — BENZONATATE 100 MG PO CAPS
200.0000 mg | ORAL_CAPSULE | ORAL | Status: AC
  Administered 2012-05-10: 200 mg via ORAL
  Filled 2012-05-10: qty 2

## 2012-05-10 NOTE — ED Provider Notes (Signed)
History     CSN: [id number]  Arrival date & time 05/10/12  1254   First MD Initiated Contact with Patient 05/10/12 1408      Chief Complaint  Patient presents with  . Shortness of Breath  . Cough  . Fever  . Weakness    (Consider location/radiation/quality/duration/timing/severity/associated sxs/prior treatment) HPI Comments: 52 year old female with a history of thyroid disease, history of pneumonias occasionally who presents with a complaint of 2 days of fever, cough and a scratchy throat. This seems to be gradually getting worse, it is not associated with myalgias, vomiting or diarrhea and she has no rashes. Nothing seems to make it better. She has not tried any over-the-counter or prescription medications and has not seen a physician prior to this visit. She denies a history of diabetes or immunocompromised state. At this time the symptoms are mild to moderate and she denies sinus tenderness, sinus drainage, postnasal drip, blurred vision, difficulty walking, problems with balance or speech.  Patient is a 52 y.o. female presenting with shortness of breath, cough, fever, and weakness. The history is provided by the patient.  Shortness of Breath Associated symptoms: cough and fever   Cough Associated symptoms: fever and shortness of breath   Fever Associated symptoms: cough   Weakness Associated symptoms include shortness of breath.    Past Medical History  Diagnosis Date  . Anxiety   . Panic attacks   . Thyroid disease   . Pneumonia     Past Surgical History  Procedure Laterality Date  . Nephrectomy      native   . Total abdominal hysterectomy    . Tubal ligation      Family History  Problem Relation Age of Onset  . Heart defect      family history   . Cancer      family history   . Arthritis      family history     History  Substance Use Topics  . Smoking status: Current Every Day Smoker -- 1.00 packs/day    Types: Cigarettes  . Smokeless tobacco: Not  on file  . Alcohol Use: No    OB History   Grav Para Term Preterm Abortions TAB SAB Ect Mult Living                  Review of Systems  Constitutional: Positive for fever.  Respiratory: Positive for cough and shortness of breath.   Neurological: Positive for weakness.  All other systems reviewed and are negative.    Allergies  Nsaids and Sulfa antibiotics  Home Medications   Current Outpatient Rx  Name  Route  Sig  Dispense  Refill  . acetaminophen (TYLENOL) 500 MG tablet   Oral   Take 1,000 mg by mouth every 6 (six) hours as needed. For pain          . ALPRAZolam (XANAX) 1 MG tablet   Oral   Take 1 mg by mouth 3 (three) times daily.         . famotidine (PEPCID) 20 MG tablet   Oral   Take 1 tablet (20 mg total) by mouth 2 (two) times daily.   60 tablet   0   . albuterol (PROVENTIL HFA;VENTOLIN HFA) 108 (90 BASE) MCG/ACT inhaler   Inhalation   Inhale 2 puffs into the lungs every 4 (four) hours as needed for wheezing or shortness of breath.   1 Inhaler   3   . azithromycin (ZITHROMAX Z-PAK) 250  MG tablet   Oral   Take 1 tablet (250 mg total) by mouth daily. 500mg  PO day 1, then 250mg  PO days 205   6 tablet   0   . benzonatate (TESSALON) 100 MG capsule   Oral   Take 1 capsule (100 mg total) by mouth every 8 (eight) hours.   21 capsule   0     BP 95/48  Pulse 95  Temp(Src) 97.7 F (36.5 C) (Oral)  Ht 5\' 4"  (1.626 m)  Wt 102 lb (46.267 kg)  BMI 17.5 kg/m2  SpO2 98%  Physical Exam  Nursing note and vitals reviewed. Constitutional: She appears well-developed and well-nourished. No distress.  HENT:  Head: Normocephalic and atraumatic.  Mouth/Throat: Oropharynx is clear and moist. No oropharyngeal exudate.  Pharynx is erythematous with shallow aphthous ulcers scattered over the soft palate and pharynx, mucous membranes are moist  Eyes: Conjunctivae and EOM are normal. Pupils are equal, round, and reactive to light. Right eye exhibits no  discharge. Left eye exhibits no discharge. No scleral icterus.  Neck: Normal range of motion. Neck supple. No JVD present. No thyromegaly present.  Cardiovascular: Normal rate, regular rhythm, normal heart sounds and intact distal pulses.  Exam reveals no gallop and no friction rub.   No murmur heard. Pulmonary/Chest: Effort normal and breath sounds normal. No respiratory distress. She has no wheezes. She has no rales.  Abdominal: Soft. Bowel sounds are normal. She exhibits no distension and no mass. There is no tenderness.  Musculoskeletal: Normal range of motion. She exhibits no edema and no tenderness.  Lymphadenopathy:    She has no cervical adenopathy.  Neurological: She is alert. Coordination normal.  Skin: Skin is warm and dry. No rash noted. No erythema.  Psychiatric: She has a normal mood and affect. Her behavior is normal.    ED Course  Procedures (including critical care time)  Labs Reviewed - No data to display Dg Chest 2 View  05/10/2012  *RADIOLOGY REPORT*  Clinical Data: Cough, congestion, fever and shortness of breath.  CHEST - 2 VIEW  Comparison: CT chest 09/27/2011.  PA and lateral chest 04/01/2012 and 09/20/2011.  Findings: Chronic bilateral nodular opacities are unchanged.  There is no new airspace disease or effusion.  Heart size is normal.  No pneumothorax identified.  IMPRESSION: No change in chronic bilateral micronodules possibly due to prior infectious or inflammatory process.  MAI is also a consideration.   Original Report Authenticated By: Holley Dexter, M.D.      1. Bronchitis       MDM  The patient appears to have a bronchitis, her oxygen is 98%, she is afebrile and is not tachycardic. She overall appears well. She will need a chest x-ray to rule out pneumonia though I suspect a viral process. Tessalon ordered.  Chest x-ray according to my interpretation is a 2 view of the chest, there is some bilateral nodules that are present, this is compared to  prior x-rays from the recent past 2 months. No significant changes, no infiltrates, no pneumothorax, mediastinal appears normal.  Patient encouraged to followup closely with family doctor, Tessalon, Zithromax, albuterol MDI when necessary shortness of breath wheezing, followup as needed in the emergency department.      Vida Roller, MD 05/10/12 [phone]

## 2012-05-10 NOTE — ED Notes (Signed)
Pt has had pneumonia every 4 months per pt, states "feels the same way as before when I have had pneumonia". Coughing late in the evening, can't rest.

## 2012-05-14 DIAGNOSIS — E041 Nontoxic single thyroid nodule: Secondary | ICD-10-CM | POA: Insufficient documentation

## 2012-06-09 ENCOUNTER — Emergency Department (HOSPITAL_COMMUNITY)
Admission: EM | Admit: 2012-06-09 | Discharge: 2012-06-09 | Disposition: A | Discharge status: Home / Self Care | Payer: Medicaid Other | Attending: Emergency Medicine | Admitting: Emergency Medicine

## 2012-06-09 ENCOUNTER — Encounter (HOSPITAL_COMMUNITY): Payer: Self-pay

## 2012-06-09 DIAGNOSIS — F17200 Nicotine dependence, unspecified, uncomplicated: Secondary | ICD-10-CM | POA: Insufficient documentation

## 2012-06-09 DIAGNOSIS — E0500 Thyrotoxicosis with diffuse goiter without thyrotoxic crisis or storm: Secondary | ICD-10-CM | POA: Insufficient documentation

## 2012-06-09 DIAGNOSIS — T382X5A Adverse effect of antithyroid drugs, initial encounter: Secondary | ICD-10-CM | POA: Insufficient documentation

## 2012-06-09 DIAGNOSIS — Z79899 Other long term (current) drug therapy: Secondary | ICD-10-CM | POA: Insufficient documentation

## 2012-06-09 DIAGNOSIS — F411 Generalized anxiety disorder: Secondary | ICD-10-CM | POA: Insufficient documentation

## 2012-06-09 DIAGNOSIS — IMO0002 Reserved for concepts with insufficient information to code with codable children: Secondary | ICD-10-CM | POA: Insufficient documentation

## 2012-06-09 DIAGNOSIS — L299 Pruritus, unspecified: Secondary | ICD-10-CM | POA: Insufficient documentation

## 2012-06-09 DIAGNOSIS — T7840XA Allergy, unspecified, initial encounter: Secondary | ICD-10-CM

## 2012-06-09 DIAGNOSIS — Z8701 Personal history of pneumonia (recurrent): Secondary | ICD-10-CM | POA: Insufficient documentation

## 2012-06-09 DIAGNOSIS — R21 Rash and other nonspecific skin eruption: Secondary | ICD-10-CM | POA: Insufficient documentation

## 2012-06-09 HISTORY — DX: Thyrotoxicosis with diffuse goiter without thyrotoxic crisis or storm: E05.00

## 2012-06-09 MED ORDER — PREDNISONE 20 MG PO TABS: ORAL_TABLET | ORAL | Status: DC

## 2012-06-09 MED ORDER — DIPHENHYDRAMINE HCL 25 MG PO TABS: 25.0000 mg | ORAL_TABLET | Freq: Four times a day (QID) | ORAL | Status: DC

## 2012-06-09 MED ORDER — PREDNISONE 50 MG PO TABS
60.0000 mg | ORAL_TABLET | Freq: Once | ORAL | Status: AC
  Administered 2012-06-09: 60 mg via ORAL
  Filled 2012-06-09: qty 1

## 2012-06-09 MED ORDER — FAMOTIDINE 20 MG PO TABS
20.0000 mg | ORAL_TABLET | Freq: Once | ORAL | Status: AC
  Administered 2012-06-09: 20 mg via ORAL
  Filled 2012-06-09: qty 1

## 2012-06-09 MED ORDER — DIPHENHYDRAMINE HCL 25 MG PO CAPS
25.0000 mg | ORAL_CAPSULE | Freq: Once | ORAL | Status: AC
  Administered 2012-06-09: 25 mg via ORAL
  Filled 2012-06-09: qty 1

## 2012-06-09 NOTE — ED Provider Notes (Signed)
History     CSN: [id number]  Arrival date & time 06/09/12  1628   First MD Initiated Contact with Patient 06/09/12 1703      Chief Complaint  Patient presents with  . Rash    (Consider location/radiation/quality/duration/timing/severity/associated sxs/prior treatment) HPI Comments: Patient c/o red rash to her buttocks, both forearms and left elbow that began yesterday.  States she noticed the rash on her buttocks first and has spread to her arms today.  She applied OTC anti-itch cream w/o relief.  She denies new detergent, chemicals or food.  She does state that she started taking Tapazole 2 weeks ago for Graves Disease that was prescribed by her endocrinologist at [facility].  She denies difficulty swallowing, breathing, swelling of her face, lips or tongue.    Patient is a 52 y.o. female presenting with rash. The history is provided by the patient.  Rash Location:  Shoulder/arm and ano-genital Shoulder/arm rash location:  L elbow, L forearm, L upper arm and R forearm Ano-genital rash location:  L buttock and R buttock Quality: itchiness and redness   Quality: not blistering, not bruising, not draining, not painful, not scaling, not swelling and not weeping   Severity:  Mild Onset quality:  Gradual Timing:  Constant Progression:  Worsening Chronicity:  New Context: medications   Context: not chemical exposure, not exposure to similar rash, not food, not hot tub use, not new detergent/soap, not plant contact, not pregnancy and not sick contacts   Relieved by:  Nothing Worsened by:  Nothing tried Ineffective treatments:  Anti-itch cream Associated symptoms: no abdominal pain, no fever, no headaches, no hoarse voice, no joint pain, no nausea, no periorbital edema, no shortness of breath, no sore throat, no throat swelling, no tongue swelling, not vomiting and not wheezing     Past Medical History  Diagnosis Date  . Anxiety   . Panic attacks   . Thyroid disease   . Pneumonia   .  Graves disease     Past Surgical History  Procedure Laterality Date  . Nephrectomy      native   . Total abdominal hysterectomy    . Tubal ligation      Family History  Problem Relation Age of Onset  . Heart defect      family history   . Cancer      family history   . Arthritis      family history     History  Substance Use Topics  . Smoking status: Current Every Day Smoker -- 1.00 packs/day    Types: Cigarettes  . Smokeless tobacco: Not on file  . Alcohol Use: No    OB History   Grav Para Term Preterm Abortions TAB SAB Ect Mult Living                  Review of Systems  Constitutional: Negative for fever, chills, activity change and appetite change.  HENT: Negative for sore throat, hoarse voice, facial swelling, trouble swallowing, neck pain and neck stiffness.   Respiratory: Negative for chest tightness, shortness of breath and wheezing.   Cardiovascular: Negative for chest pain.  Gastrointestinal: Negative for nausea, vomiting and abdominal pain.  Musculoskeletal: Negative for arthralgias.  Skin: Positive for rash. Negative for wound.  Neurological: Negative for dizziness, weakness, numbness and headaches.  Psychiatric/Behavioral: Negative for confusion. The patient is not nervous/anxious.   All other systems reviewed and are negative.    Allergies  Nsaids and Sulfa antibiotics  Home Medications  Current Outpatient Rx  Name  Route  Sig  Dispense  Refill  . albuterol (PROVENTIL HFA;VENTOLIN HFA) 108 (90 BASE) MCG/ACT inhaler   Inhalation   Inhale 2 puffs into the lungs every 4 (four) hours as needed for wheezing or shortness of breath.   1 Inhaler   3   . ALPRAZolam (XANAX) 1 MG tablet   Oral   Take 1 mg by mouth 3 (three) times daily.         . famotidine (PEPCID) 20 MG tablet   Oral   Take 1 tablet (20 mg total) by mouth 2 (two) times daily.   60 tablet   0   . Fluticasone-Salmeterol (ADVAIR) 250-50 MCG/DOSE AEPB   Inhalation   Inhale  1 puff into the lungs every 12 (twelve) hours.         . methimazole (TAPAZOLE) 10 MG tablet   Oral   Take 30 mg by mouth daily.         Marland Kitchen acetaminophen (TYLENOL) 500 MG tablet   Oral   Take 1,000 mg by mouth every 6 (six) hours as needed. For pain          . diphenhydrAMINE (BENADRYL) 25 MG tablet   Oral   Take 1 tablet (25 mg total) by mouth every 6 (six) hours.   20 tablet   0   . predniSONE (DELTASONE) 20 MG tablet      Take 3 tablets po qd x 2 days, then 2 tablets po qd x 2 days, then 1 tablet po qd x 2 days   12 tablet   0     BP 98/48  Pulse 78  Temp(Src) 98 F (36.7 C) (Oral)  Resp 16  Ht 5\' 4"  (1.626 m)  Wt 99 lb (44.906 kg)  BMI 16.98 kg/m2  SpO2 99%  Physical Exam  Nursing note and vitals reviewed. Constitutional: She is oriented to person, place, and time. She appears well-developed and well-nourished. No distress.  HENT:  Head: Normocephalic and atraumatic.  Mouth/Throat: Oropharynx is clear and moist.  Neck: Normal range of motion. Neck supple.  Cardiovascular: Normal rate, regular rhythm, normal heart sounds and intact distal pulses.   No murmur heard. Pulmonary/Chest: Effort normal and breath sounds normal. No respiratory distress. She has no wheezes.  Abdominal: Soft. She exhibits no distension. There is no tenderness.  Musculoskeletal: She exhibits no edema and no tenderness.  Lymphadenopathy:    She has no cervical adenopathy.  Neurological: She is alert and oriented to person, place, and time. She exhibits normal muscle tone. Coordination normal.  Skin: Rash noted. There is erythema.  Slightly raised erythematous plaques to the buttocks, left elbow and bilateral forearms.  Appears c/w hives.  No edema.      ED Course  Procedures (including critical care time)  Labs Reviewed - No data to display No results found.   1. Allergic reaction, initial encounter       MDM   Patient has rash to her UE's and buttocks that appears c/w  hives.  Likely related to Tapazole.  No airway compromise, facial edema or wheezing.  Will administer, benadryl, pepcid and prednisone and observe.  Patient has been observed for ~1 hr w/o complications.  She is feeling better.  I have advised her to d/c the Tapazole and f/u with her endocrinologist .  Will also prescribe prednisone taper and benadryl  The patient appears reasonably screened and/or stabilized for discharge and I doubt any other medical  condition or other [facility] requiring further screening, evaluation, or treatment in the ED at this time prior to discharge.        Jeannemarie Sawaya L. Baltazar Pekala, PA-C 06/11/12 1253

## 2012-06-09 NOTE — ED Notes (Signed)
Patient presents with the a rash that began on her buttocks and on her left arm. Patient does have small hives on her left arm. Patient thinks she has shingles but she also started taking a new medication for her thyroid on Feb. 28th.

## 2012-06-12 NOTE — ED Provider Notes (Signed)
Medical screening examination/treatment/procedure(s) were performed by non-physician practitioner and as supervising physician I was immediately available for consultation/collaboration.    Vida Roller, MD 06/12/12 [phone]

## 2012-10-23 ENCOUNTER — Emergency Department (HOSPITAL_COMMUNITY): Payer: Self-pay

## 2012-10-23 ENCOUNTER — Encounter (HOSPITAL_COMMUNITY): Payer: Self-pay | Admitting: Emergency Medicine

## 2012-10-23 ENCOUNTER — Emergency Department (HOSPITAL_COMMUNITY)
Admission: EM | Admit: 2012-10-23 | Discharge: 2012-10-23 | Disposition: A | Discharge status: Home / Self Care | Payer: Self-pay | Attending: Emergency Medicine | Admitting: Emergency Medicine

## 2012-10-23 DIAGNOSIS — Z79899 Other long term (current) drug therapy: Secondary | ICD-10-CM | POA: Insufficient documentation

## 2012-10-23 DIAGNOSIS — Z862 Personal history of diseases of the blood and blood-forming organs and certain disorders involving the immune mechanism: Secondary | ICD-10-CM | POA: Insufficient documentation

## 2012-10-23 DIAGNOSIS — Z87448 Personal history of other diseases of urinary system: Secondary | ICD-10-CM | POA: Insufficient documentation

## 2012-10-23 DIAGNOSIS — G43909 Migraine, unspecified, not intractable, without status migrainosus: Secondary | ICD-10-CM | POA: Insufficient documentation

## 2012-10-23 DIAGNOSIS — R209 Unspecified disturbances of skin sensation: Secondary | ICD-10-CM | POA: Insufficient documentation

## 2012-10-23 DIAGNOSIS — F17200 Nicotine dependence, unspecified, uncomplicated: Secondary | ICD-10-CM | POA: Insufficient documentation

## 2012-10-23 DIAGNOSIS — Z8701 Personal history of pneumonia (recurrent): Secondary | ICD-10-CM | POA: Insufficient documentation

## 2012-10-23 DIAGNOSIS — F410 Panic disorder [episodic paroxysmal anxiety] without agoraphobia: Secondary | ICD-10-CM | POA: Insufficient documentation

## 2012-10-23 DIAGNOSIS — E079 Disorder of thyroid, unspecified: Secondary | ICD-10-CM | POA: Insufficient documentation

## 2012-10-23 DIAGNOSIS — Z8639 Personal history of other endocrine, nutritional and metabolic disease: Secondary | ICD-10-CM | POA: Insufficient documentation

## 2012-10-23 DIAGNOSIS — R110 Nausea: Secondary | ICD-10-CM | POA: Insufficient documentation

## 2012-10-23 LAB — CBC WITH DIFFERENTIAL/PLATELET
Basophils Relative: 0 % (ref 0–1)
Eosinophils Absolute: 0 10*3/uL (ref 0.0–0.7)
Eosinophils Relative: 0 % (ref 0–5)
Hemoglobin: 14.8 g/dL (ref 12.0–15.0)
MCH: 29.8 pg (ref 26.0–34.0)
MCHC: 33.8 g/dL (ref 30.0–36.0)
MCV: 88.3 fL (ref 78.0–100.0)
Monocytes Relative: 6 % (ref 3–12)
Neutrophils Relative %: 70 % (ref 43–77)
Platelets: 209 10*3/uL (ref 150–400)

## 2012-10-23 LAB — BASIC METABOLIC PANEL
BUN: 5 mg/dL — ABNORMAL LOW (ref 6–23)
Calcium: 8.8 mg/dL (ref 8.4–10.5)
Creatinine, Ser: 0.67 mg/dL (ref 0.50–1.10)
GFR calc Af Amer: >90 mL/min (ref >90)
GFR calc non Af Amer: >90 mL/min (ref >90)
Potassium: 3.9 mEq/L (ref 3.5–5.1)

## 2012-10-23 MED ORDER — PROCHLORPERAZINE EDISYLATE 5 MG/ML IJ SOLN
10.0000 mg | Freq: Once | INTRAMUSCULAR | Status: AC
  Administered 2012-10-23: 10 mg via INTRAVENOUS
  Filled 2012-10-23: qty 2

## 2012-10-23 MED ORDER — DIPHENHYDRAMINE HCL 50 MG/ML IJ SOLN
25.0000 mg | Freq: Once | INTRAMUSCULAR | Status: AC
  Administered 2012-10-23: 25 mg via INTRAVENOUS
  Filled 2012-10-23: qty 1

## 2012-10-23 MED ORDER — SODIUM CHLORIDE 0.9 % IV BOLUS (SEPSIS)
500.0000 mL | Freq: Once | INTRAVENOUS | Status: AC
  Administered 2012-10-23: 500 mL via INTRAVENOUS

## 2012-10-23 NOTE — ED Provider Notes (Signed)
CSN: [id number]     Arrival date & time 10/23/12  1703 History    This chart was scribed for Heather Wang, * by Donne Anon, ED Scribe. This patient was seen in room APA07/APA07 and the patient's care was started at 1732.   First MD Initiated Contact with Patient 10/23/12 1732     Chief Complaint  Patient presents with  . Headache  . Tingling    The history is provided by the patient. No language interpreter was used.   HPI Comments: GAGANDEEP KOSSMAN is a 52 y.o. female who presents to the Emergency Department complaining of 2 days of gradual onset, gradually worsening, constant, HA localized over her temples. She states today she began experiencing left arm tingling and numbness while she was at her PCP's office, who performed an EKG and sent her to the ED. The tingling is beginning to resolve in the ED. She reports associated nausea. She denies vomiting, photophobia, vision changes, blurred vision or any other symptoms. She denies a hx of migraines.    Past Medical History  Diagnosis Date  . Anxiety   . Panic attacks   . Thyroid disease   . Pneumonia   . Graves disease   . Renal disorder    Past Surgical History  Procedure Laterality Date  . Nephrectomy      native   . Total abdominal hysterectomy    . Tubal ligation     Family History  Problem Relation Age of Onset  . Heart defect      family history   . Cancer      family history   . Arthritis      family history    History  Substance Use Topics  . Smoking status: Current Every Day Smoker -- 1.00 packs/day    Types: Cigarettes  . Smokeless tobacco: Not on file  . Alcohol Use: No   OB History   Grav Para Term Preterm Abortions TAB SAB Ect Mult Living                 Review of Systems  Eyes: Negative for photophobia and visual disturbance.  Gastrointestinal: Positive for nausea. Negative for vomiting.  Neurological: Positive for numbness and headaches.  All other systems reviewed and are  negative.    Allergies  Nsaids and Sulfa antibiotics  Home Medications   Current Outpatient Rx  Name  Route  Sig  Dispense  Refill  . acetaminophen (TYLENOL) 500 MG tablet   Oral   Take 1,000 mg by mouth every 6 (six) hours as needed. For pain          . albuterol (PROVENTIL HFA;VENTOLIN HFA) 108 (90 BASE) MCG/ACT inhaler   Inhalation   Inhale 2 puffs into the lungs every 4 (four) hours as needed for wheezing or shortness of breath.   1 Inhaler   3   . ALPRAZolam (XANAX) 1 MG tablet   Oral   Take 1 mg by mouth 3 (three) times daily.         . diphenhydrAMINE (BENADRYL) 25 MG tablet   Oral   Take 1 tablet (25 mg total) by mouth every 6 (six) hours.   20 tablet   0   . famotidine (PEPCID) 20 MG tablet   Oral   Take 1 tablet (20 mg total) by mouth 2 (two) times daily.   60 tablet   0   . Fluticasone-Salmeterol (ADVAIR) 250-50 MCG/DOSE AEPB   Inhalation  Inhale 1 puff into the lungs every 12 (twelve) hours.         . methimazole (TAPAZOLE) 10 MG tablet   Oral   Take 30 mg by mouth daily.         . predniSONE (DELTASONE) 20 MG tablet      Take 3 tablets po qd x 2 days, then 2 tablets po qd x 2 days, then 1 tablet po qd x 2 days   12 tablet   0    BP 103/70  Pulse 77  Temp(Src) 98 F (36.7 C) (Oral)  Resp 18  Ht 5\' 4"  (1.626 m)  Wt 105 lb (47.628 kg)  BMI 18.01 kg/m2  SpO2 98%  Physical Exam  Constitutional: She is oriented to person, place, and time. She appears well-developed and well-nourished. No distress.  HENT:  Head: Normocephalic and atraumatic.  Right Ear: Hearing normal.  Left Ear: Hearing normal.  Nose: Nose normal.  Mouth/Throat: Oropharynx is clear and moist and mucous membranes are normal.  Eyes: Conjunctivae and EOM are normal. Pupils are equal, round, and reactive to light.  Neck: Normal range of motion. Neck supple.  Cardiovascular: Regular rhythm, S1 normal and S2 normal.  Exam reveals no gallop and no friction rub.   No  murmur heard. Pulmonary/Chest: Effort normal and breath sounds normal. No respiratory distress. She exhibits no tenderness.  Abdominal: Soft. Normal appearance and bowel sounds are normal. There is no hepatosplenomegaly. There is no tenderness. There is no rebound, no guarding, no tenderness at McBurney's point and negative Murphy's sign. No hernia.  Musculoskeletal: Normal range of motion.  Neurological: She is alert and oriented to person, place, and time. She has normal strength. No cranial nerve deficit or sensory deficit. Coordination normal. GCS eye subscore is 4. GCS verbal subscore is 5. GCS motor subscore is 6.  Equal grip strength. Normal coordination by finger to nose. Negative pronator drift.  Skin: Skin is warm, dry and intact. No rash noted. No cyanosis.  Psychiatric: She has a normal mood and affect. Her speech is normal and behavior is normal. Thought content normal.    ED Course   Procedures (including critical care time) DIAGNOSTIC STUDIES: Oxygen Saturation is 98% on RA, normal by my interpretation.    EKG:  Date: 10/23/2012  Rate: 77  Rhythm: normal sinus rhythm  QRS Axis: normal  Intervals: normal  ST/T Wave abnormalities: nonspecific T wave changes  Conduction Disutrbances:none  Narrative Interpretation:   Old EKG Reviewed: unchanged    COORDINATION OF CARE: 5:39 PM Discussed treatment plan which includes IV fluids, EKG, Compazine, Benadryl, head CT, CXR, cbc with differential, basic metabolic panel and troponin level with pt at bedside and pt agreed to plan.    Labs Reviewed  BASIC METABOLIC PANEL - Abnormal; Notable for the following:    BUN 5 (*)    All other components within normal limits  CBC WITH DIFFERENTIAL  TROPONIN I   Dg Chest 2 View  10/23/2012   *RADIOLOGY REPORT*  Clinical Data: 52 year old female with chest pain.  CHEST - 2 VIEW  Comparison: 05/10/2012 and prior chest radiographs dating back to 07/20/2008  Findings: The cardiomediastinal  silhouette is unremarkable. Lingular and right middle lobe atelectasis/scarring are unchanged. There is no evidence of focal airspace disease, pulmonary edema, suspicious pulmonary nodule/mass, pleural effusion, or pneumothorax. No acute bony abnormalities are identified.  IMPRESSION: No evidence of active cardiopulmonary disease.  Unchanged lingular and right middle lobe atelectasis/scarring.   Original Report  Authenticated By: Harmon Pier, M.D.   Ct Head Wo Contrast  10/23/2012   *RADIOLOGY REPORT*  Clinical Data: Severe headache for 2 days.  Left arm tingling and numbness.  CT HEAD WITHOUT CONTRAST  Technique:  Contiguous axial images were obtained from the base of the skull through the vertex without contrast.  Comparison: None.  Findings: There is no acute intracranial hemorrhage, infarction, or mass lesion.  Brain parenchyma is normal.  Osseous structures are normal.  IMPRESSION: Normal exam.   Original Report Authenticated By: Francene Boyers, M.D.   Diagnosis: Headache, likely migraine  MDM  Patient comes to the ER for evaluation of headache. Patient reports headache for 2 days. Today she had onset of numbness in the left arm associated with headache. The numbness was improving at arrival. Examination was unremarkable, normal strength, sensation reflexes in the upper extremities and lower extremities. No focal neurologic findings. CT scan was unremarkable. Patient treated for migraine and has had resolution of headache and resolution of the numbness and tingling. I suspect this was a migraine and the patient is a discharge, followup with her primary doctor. Return if symptoms worsen.   I personally performed the services described in this documentation, which was scribed in my presence. The recorded information has been reviewed and is accurate.    Heather Crease, MD 10/23/12 1944

## 2012-10-23 NOTE — ED Notes (Signed)
Constant headache that moves around x 2 days. Tingling to left arm today. Denies blurred vision/dizziness. Alert/oriented. Smile symmetrical. Denies trouble swallowing/speaking.

## 2012-11-06 ENCOUNTER — Emergency Department (HOSPITAL_COMMUNITY): Payer: No Typology Code available for payment source

## 2012-11-06 ENCOUNTER — Encounter (HOSPITAL_COMMUNITY): Payer: Self-pay

## 2012-11-06 ENCOUNTER — Emergency Department (HOSPITAL_COMMUNITY)
Admission: EM | Admit: 2012-11-06 | Discharge: 2012-11-06 | Disposition: A | Discharge status: Home / Self Care | Payer: No Typology Code available for payment source | Attending: Emergency Medicine | Admitting: Emergency Medicine

## 2012-11-06 DIAGNOSIS — F411 Generalized anxiety disorder: Secondary | ICD-10-CM | POA: Insufficient documentation

## 2012-11-06 DIAGNOSIS — Y9389 Activity, other specified: Secondary | ICD-10-CM | POA: Insufficient documentation

## 2012-11-06 DIAGNOSIS — F410 Panic disorder [episodic paroxysmal anxiety] without agoraphobia: Secondary | ICD-10-CM | POA: Insufficient documentation

## 2012-11-06 DIAGNOSIS — S0993XA Unspecified injury of face, initial encounter: Secondary | ICD-10-CM | POA: Insufficient documentation

## 2012-11-06 DIAGNOSIS — T148XXA Other injury of unspecified body region, initial encounter: Secondary | ICD-10-CM

## 2012-11-06 DIAGNOSIS — Z87448 Personal history of other diseases of urinary system: Secondary | ICD-10-CM | POA: Insufficient documentation

## 2012-11-06 DIAGNOSIS — Z79899 Other long term (current) drug therapy: Secondary | ICD-10-CM | POA: Insufficient documentation

## 2012-11-06 DIAGNOSIS — IMO0002 Reserved for concepts with insufficient information to code with codable children: Secondary | ICD-10-CM | POA: Insufficient documentation

## 2012-11-06 DIAGNOSIS — Y92410 Unspecified street and highway as the place of occurrence of the external cause: Secondary | ICD-10-CM | POA: Insufficient documentation

## 2012-11-06 DIAGNOSIS — Z8701 Personal history of pneumonia (recurrent): Secondary | ICD-10-CM | POA: Insufficient documentation

## 2012-11-06 DIAGNOSIS — F17200 Nicotine dependence, unspecified, uncomplicated: Secondary | ICD-10-CM | POA: Insufficient documentation

## 2012-11-06 DIAGNOSIS — Z862 Personal history of diseases of the blood and blood-forming organs and certain disorders involving the immune mechanism: Secondary | ICD-10-CM | POA: Insufficient documentation

## 2012-11-06 DIAGNOSIS — Z8639 Personal history of other endocrine, nutritional and metabolic disease: Secondary | ICD-10-CM | POA: Insufficient documentation

## 2012-11-06 MED ORDER — HYDROCODONE-ACETAMINOPHEN 5-325 MG PO TABS
2.0000 | ORAL_TABLET | Freq: Once | ORAL | Status: AC
  Administered 2012-11-06: 2 via ORAL
  Filled 2012-11-06: qty 2

## 2012-11-06 MED ORDER — METHOCARBAMOL 500 MG PO TABS: 1000.0000 mg | ORAL_TABLET | Freq: Four times a day (QID) | ORAL | Status: DC | PRN

## 2012-11-06 MED ORDER — HYDROCODONE-ACETAMINOPHEN 5-325 MG PO TABS: ORAL_TABLET | ORAL | Status: DC

## 2012-11-06 NOTE — ED Notes (Signed)
Dr.mcmanus to see pt 

## 2012-11-06 NOTE — ED Notes (Signed)
shp at bedside of pt. Speaking to family member, pt in in xray
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
## 2013-05-01 IMAGING — CT CT CHEST W/O CM
2 of 3 series · 15 of 36 positions shown, 18 images · non-contrast
Comparison: Chest radiograph 09/20/2011.

CLINICAL DATA: Abnormal chest radiograph. Cough.  Smoker.

CT CHEST WITHOUT CONTRAST
TECHNIQUE: Multidetector CT imaging of the chest was performed
following the standard protocol without IV contrast.

[Series 2: chestroutine 5.0 b40f · axial · 0.57mm/px · z∈[-301,-56]mm · 12 of 59 slices shown, 15 images]
[im 5/59  mediastinal]
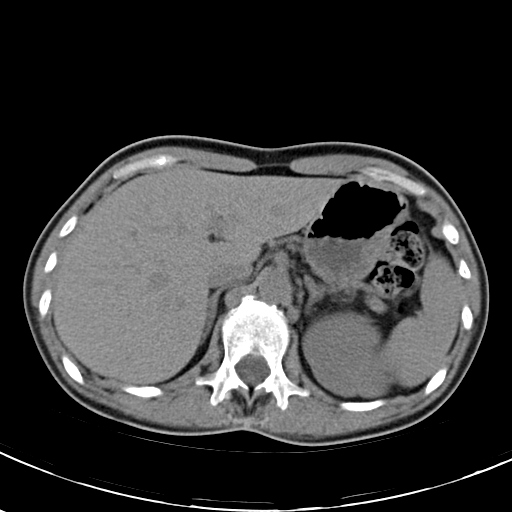
[im 5/59  lung]
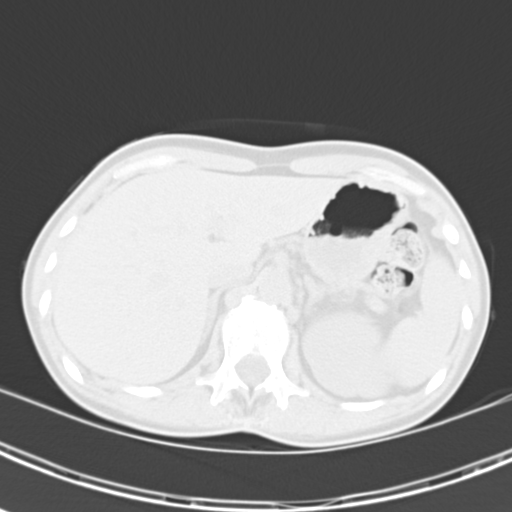
[im 9/59  lung]
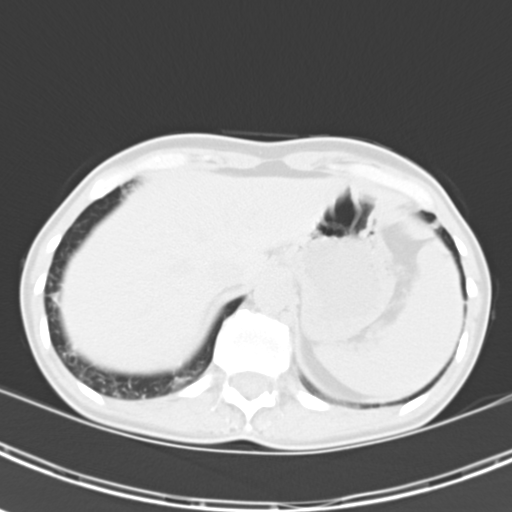
[im 13/59  lung]
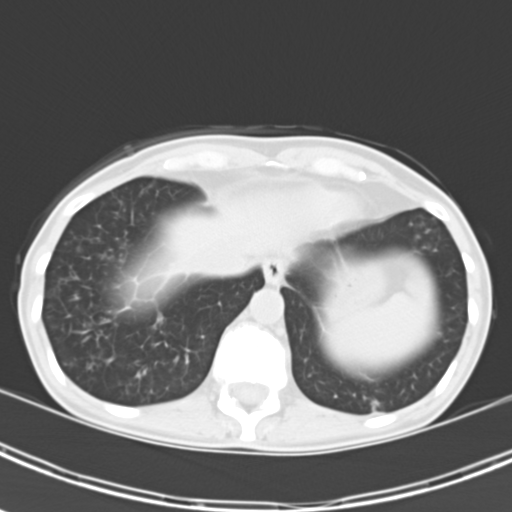
[im 18/59  lung]
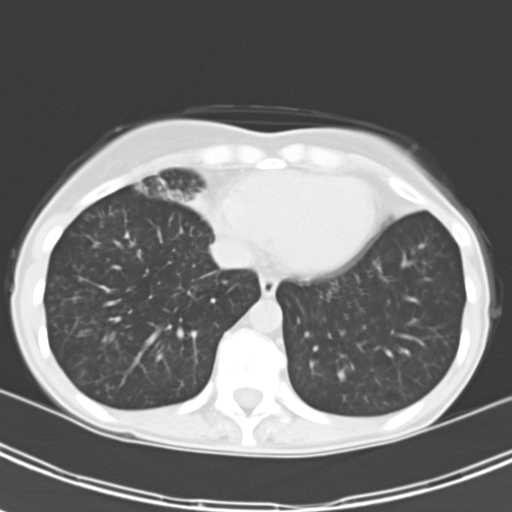
[im 22/59  mediastinal]
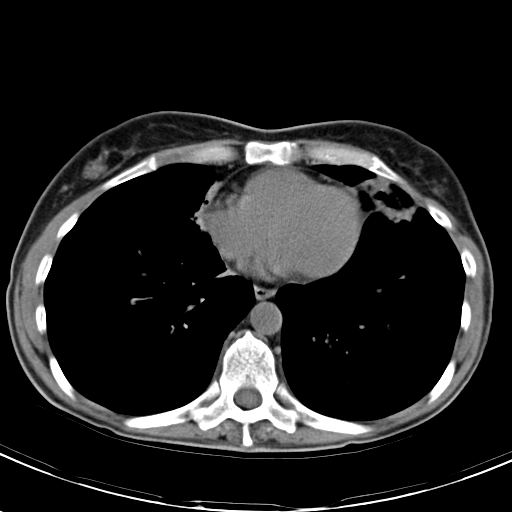
[im 22/59  lung]
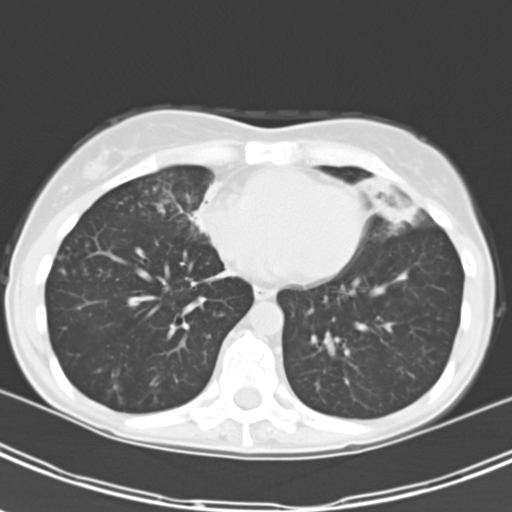
[im 26/59  lung]
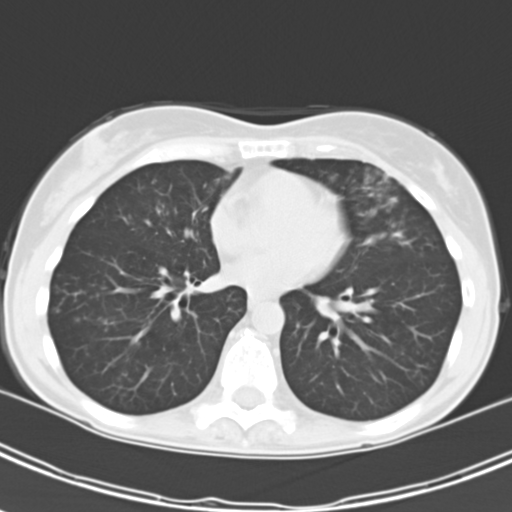
[im 33/59  lung]
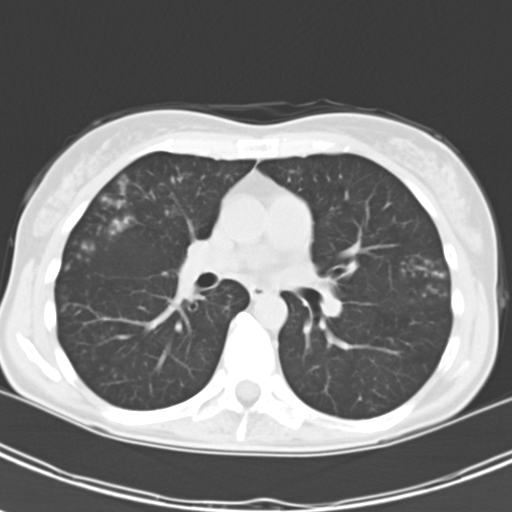
[im 37/59  lung]
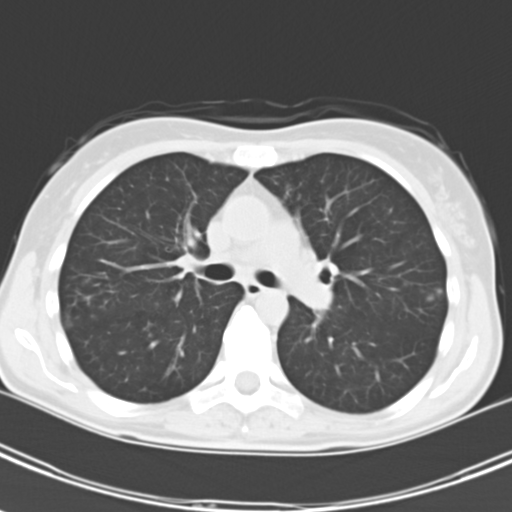
[im 41/59  mediastinal]
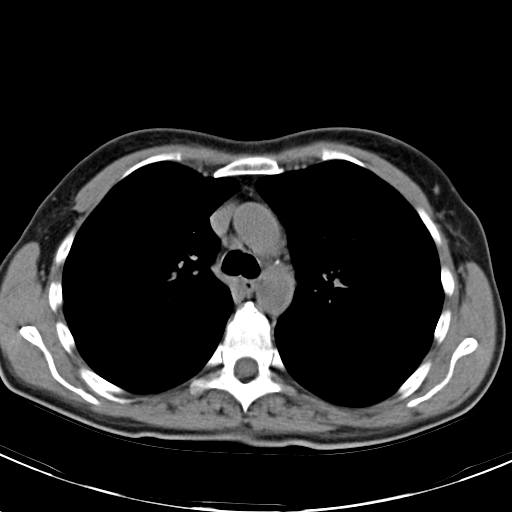
[im 41/59  lung]
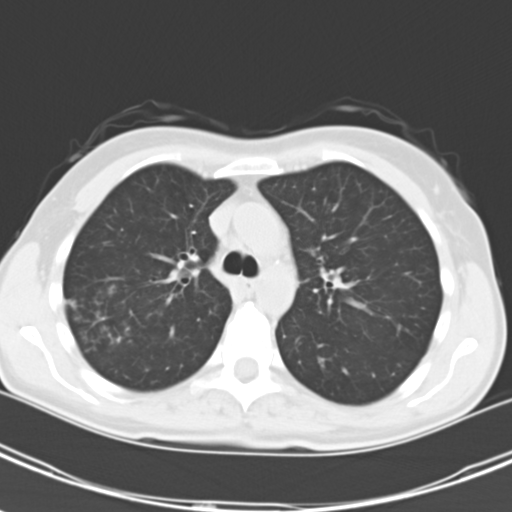
[im 46/59  lung]
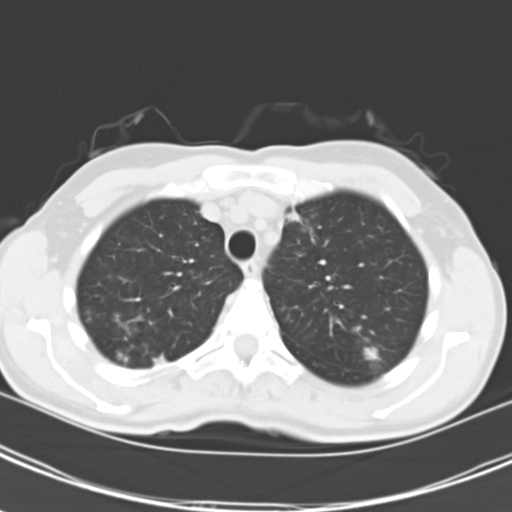
[im 50/59  lung]
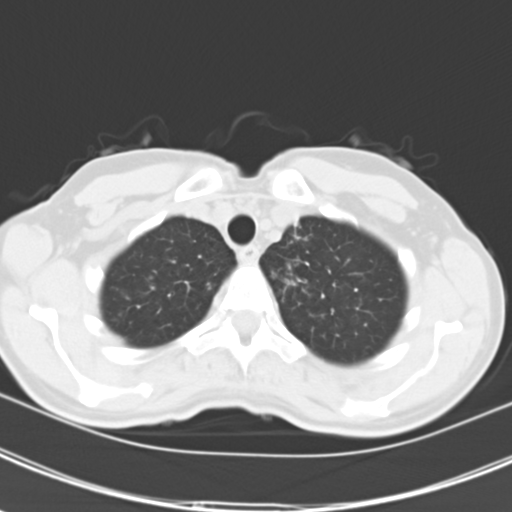
[im 54/59  lung]
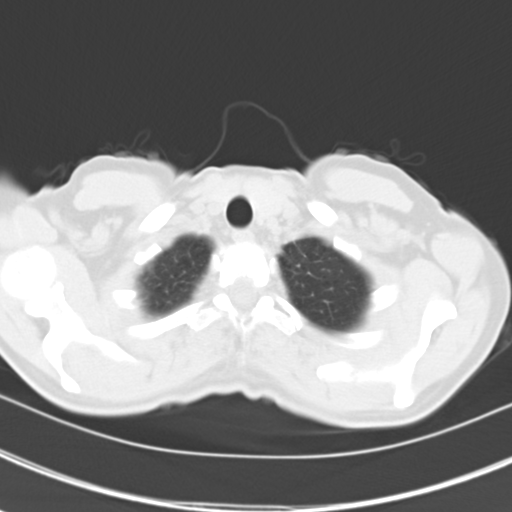

[Series 4: mpr coro 3mm · coronal · 0.58mm/px · 3 of 61 slices shown]
[im 13/61  lung]
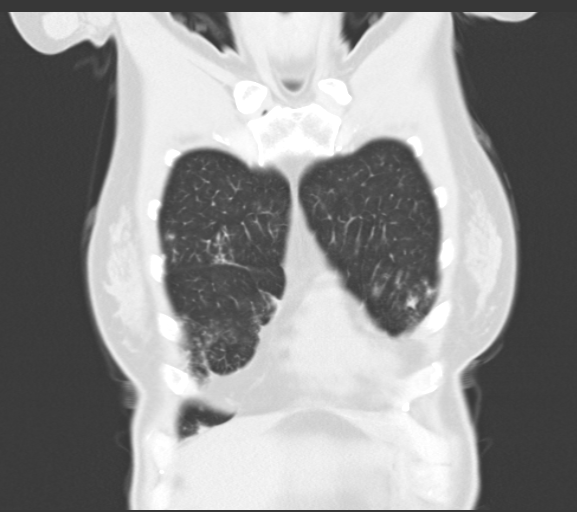
[im 25/61  lung]
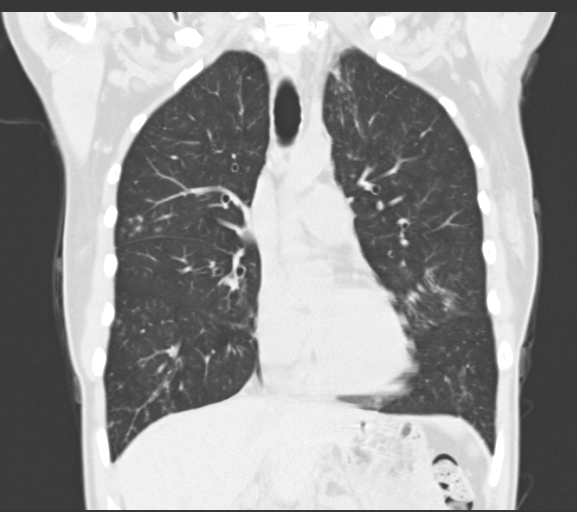
[im 37/61  lung]
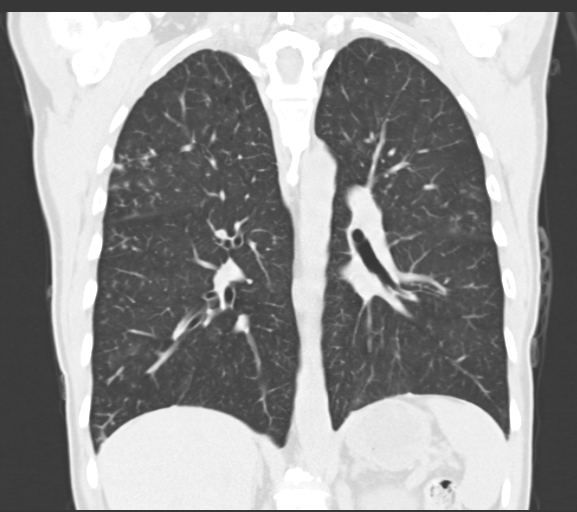

[15 of 36 positions shown; findings below may reference images not displayed]

FINDINGS: Mediastinal lymph nodes measure up to 10 mm in the lower
right paratracheal station.  Hilar regions are difficult to
definitively evaluate without IV contrast.  No axillary adenopathy.
Heart size normal.  No pericardial effusion.

There is diffuse ill-defined peribronchovascular nodularity and
scattered nodular airspace consolidation with areas of mucoid
impaction.  Probable underlying scarring in the right middle lobe
and lingula.  No pleural fluid.  Airway is unremarkable.

Incidental imaging of the upper abdomen shows no acute findings.
No worrisome lytic or sclerotic lesions.
IMPRESSION: Diffuse peribronchovascular ill-defined nodularity, nodular
airspace consolidation and mucoid impaction, indicative of
infectious bronchiolitis and/or bronchopneumonia.  Follow-up to
clearing could be performed to ensure resolution of the more
nodular appearing components, in 4-6 weeks, with CT chest without
contrast, as clinically indicated.

## 2013-08-08 ENCOUNTER — Encounter (HOSPITAL_COMMUNITY): Payer: Self-pay | Admitting: Psychiatry

## 2013-08-08 ENCOUNTER — Ambulatory Visit (INDEPENDENT_AMBULATORY_CARE_PROVIDER_SITE_OTHER): Payer: 59 | Admitting: Psychiatry

## 2013-08-08 VITALS — BP 100/60 | Ht 64.0 in | Wt 108.0 lb

## 2013-08-08 DIAGNOSIS — F32A Depression, unspecified: Secondary | ICD-10-CM

## 2013-08-08 DIAGNOSIS — F3289 Other specified depressive episodes: Secondary | ICD-10-CM

## 2013-08-08 DIAGNOSIS — F329 Major depressive disorder, single episode, unspecified: Secondary | ICD-10-CM

## 2013-08-08 DIAGNOSIS — F411 Generalized anxiety disorder: Secondary | ICD-10-CM

## 2013-08-08 MED ORDER — SERTRALINE HCL 50 MG PO TABS
50.0000 mg | ORAL_TABLET | Freq: Every day | ORAL | Status: DC
Start: 2013-08-08 — End: 2013-09-05
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
## 2013-09-02 ENCOUNTER — Ambulatory Visit (HOSPITAL_COMMUNITY): Payer: Self-pay | Admitting: Psychiatry

## 2013-09-05 ENCOUNTER — Ambulatory Visit (INDEPENDENT_AMBULATORY_CARE_PROVIDER_SITE_OTHER): Payer: 59 | Admitting: Psychiatry

## 2013-09-05 ENCOUNTER — Encounter (HOSPITAL_COMMUNITY): Payer: Self-pay | Admitting: Psychiatry

## 2013-09-05 VITALS — BP 90/60 | Ht 64.0 in | Wt 103.0 lb

## 2013-09-05 DIAGNOSIS — F3289 Other specified depressive episodes: Secondary | ICD-10-CM

## 2013-09-05 DIAGNOSIS — F329 Major depressive disorder, single episode, unspecified: Secondary | ICD-10-CM

## 2013-09-05 DIAGNOSIS — F411 Generalized anxiety disorder: Secondary | ICD-10-CM

## 2013-09-05 DIAGNOSIS — F32A Depression, unspecified: Secondary | ICD-10-CM

## 2013-09-05 MED ORDER — LAMOTRIGINE 25 MG PO TABS: ORAL_TABLET | ORAL | Status: DC

## 2013-09-05 MED ORDER — SERTRALINE HCL 100 MG PO TABS: 100.0000 mg | ORAL_TABLET | Freq: Every day | ORAL | Status: DC
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
## 2013-09-11 ENCOUNTER — Ambulatory Visit (HOSPITAL_COMMUNITY): Payer: Self-pay | Admitting: Psychiatry

## 2013-09-29 ENCOUNTER — Ambulatory Visit (INDEPENDENT_AMBULATORY_CARE_PROVIDER_SITE_OTHER): Payer: 59 | Admitting: Psychiatry

## 2013-09-29 ENCOUNTER — Encounter (HOSPITAL_COMMUNITY): Payer: Self-pay | Admitting: Psychiatry

## 2013-09-29 DIAGNOSIS — F331 Major depressive disorder, recurrent, moderate: Secondary | ICD-10-CM

## 2013-09-29 DIAGNOSIS — F411 Generalized anxiety disorder: Secondary | ICD-10-CM

## 2013-09-29 NOTE — Patient Instructions (Signed)
Discussed orally 
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
## 2013-10-03 ENCOUNTER — Ambulatory Visit (HOSPITAL_COMMUNITY): Payer: Self-pay | Admitting: Psychiatry

## 2013-10-03 ENCOUNTER — Encounter (HOSPITAL_COMMUNITY): Payer: Self-pay | Admitting: Psychiatry

## 2013-10-17 ENCOUNTER — Ambulatory Visit (HOSPITAL_COMMUNITY): Payer: Self-pay | Admitting: Psychiatry

## 2013-10-27 ENCOUNTER — Telehealth (HOSPITAL_COMMUNITY): Payer: Self-pay | Admitting: *Deleted

## 2013-10-27 NOTE — Telephone Encounter (Signed)
Opened in Error.

## 2013-10-30 ENCOUNTER — Ambulatory Visit (INDEPENDENT_AMBULATORY_CARE_PROVIDER_SITE_OTHER): Payer: 59 | Admitting: Psychiatry

## 2013-10-30 DIAGNOSIS — F331 Major depressive disorder, recurrent, moderate: Secondary | ICD-10-CM

## 2013-10-30 DIAGNOSIS — F411 Generalized anxiety disorder: Secondary | ICD-10-CM
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
## 2013-10-31 NOTE — Patient Instructions (Signed)
Discussed orally 

## 2013-11-24 ENCOUNTER — Ambulatory Visit (INDEPENDENT_AMBULATORY_CARE_PROVIDER_SITE_OTHER): Payer: 59 | Admitting: Psychiatry

## 2013-11-24 DIAGNOSIS — F411 Generalized anxiety disorder: Secondary | ICD-10-CM

## 2013-11-24 DIAGNOSIS — F331 Major depressive disorder, recurrent, moderate: Secondary | ICD-10-CM

## 2013-11-25 NOTE — Progress Notes (Addendum)
   THERAPIST PROGRESS NOTE  Session Time: Monday 11/24/2013 3:15 PM - 4:00 PM  Participation Level: Active  Behavioral Response: CasualAlertAngry, Anxious and Depressed  Type of Therapy: Individual Therapy  Treatment Goals addressed:  Learn and implement coping skills that result in reduction of anxiety, panic attacks,  and worry and improve daily functioning      Develop an awareness of angry thoughts feelings and actions along with triggers and learn alternatives to aggressive anger outbursts                 Return to previous level of functioning including resuming normal interest in activities and increased motivation.        Interventions: Supportive  Summary: Heather Wang is a 53 y.o. female who is referred for services by psychiatrist Dr. Tenny Craw to improve coping skills. Patient presents with a long standing history of symptoms of depression and anxiety since early childhood. She reports numerous hospitalizations from age 55 to age 46 due to a nonfunctioning right kidney. She eventually had surgery to have kidney remove. She also reports both mother and grandmother suffered from symptoms of anxiety and states learning some of her behaviors from them because they were always sick and worried. She says she has always had problems coping with daily life. She suffers from frequent panic attacks. She reports stress regarding her husband who was diagnosed with prostate and bladder cancer in 2008. Cancer has now spread to his bones. Patient experiences poor concentration and memory difficulty and states one of her daughters manages patient's and husband's medication as well as transport them to appointments.   Patient reports little to no change in symptoms since last session. She complains that medication does not seem to be helpful. She is scheduled to see psychiatrist Dr. Tenny Craw in the near future and will address medication issues at that time. Patient continues to experience significant anxiety  and reports continued anger outbursts. Patient also reports feeling nervous and overwhelmed when in crowds. She continues to express anger and frustration regarding her oldest daughter who has substance abuse issues. She continues to fear negative consequences for her 33 year old granddaughter should something happen to patient and her husband she also reports feeling that as though she failed with her oldest daughter.     Suicidal/Homicidal: No  Therapist Response: Therapist works with patient to identify and verbalize feelings, begin to develop treatment plan, explore triggers of anger and anxiety, practice a relaxation techniques using diaphragmatic breathing  Plan: Return again in 2-3 weeks.  Diagnosis: Axis I: MDD, GAD    Axis II: No diagnosis    Luken Shadowens, LCSW 11/25/2013

## 2013-11-25 NOTE — Patient Instructions (Signed)
Discussed orally 

## 2013-12-03 ENCOUNTER — Encounter (HOSPITAL_COMMUNITY): Payer: Self-pay | Admitting: Psychiatry

## 2013-12-03 ENCOUNTER — Ambulatory Visit (INDEPENDENT_AMBULATORY_CARE_PROVIDER_SITE_OTHER): Payer: 59 | Admitting: Psychiatry

## 2013-12-03 VITALS — BP 103/62 | HR 66 | Ht 64.0 in | Wt 103.6 lb

## 2013-12-03 DIAGNOSIS — F411 Generalized anxiety disorder: Secondary | ICD-10-CM

## 2013-12-03 DIAGNOSIS — F329 Major depressive disorder, single episode, unspecified: Secondary | ICD-10-CM

## 2013-12-03 DIAGNOSIS — F331 Major depressive disorder, recurrent, moderate: Secondary | ICD-10-CM

## 2013-12-03 DIAGNOSIS — F3289 Other specified depressive episodes: Secondary | ICD-10-CM

## 2013-12-03 MED ORDER — SERTRALINE HCL 100 MG PO TABS: 100.0000 mg | ORAL_TABLET | Freq: Every day | ORAL | Status: DC

## 2013-12-03 MED ORDER — ARIPIPRAZOLE 5 MG PO TABS: 5.0000 mg | ORAL_TABLET | Freq: Every day | ORAL | Status: DC
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
## 2013-12-17 ENCOUNTER — Ambulatory Visit (INDEPENDENT_AMBULATORY_CARE_PROVIDER_SITE_OTHER): Payer: 59 | Admitting: Psychiatry

## 2013-12-17 DIAGNOSIS — F331 Major depressive disorder, recurrent, moderate: Secondary | ICD-10-CM

## 2013-12-17 DIAGNOSIS — F411 Generalized anxiety disorder: Secondary | ICD-10-CM

## 2013-12-17 NOTE — Patient Instructions (Signed)
Discussed orally 
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
## 2014-01-02 ENCOUNTER — Ambulatory Visit (INDEPENDENT_AMBULATORY_CARE_PROVIDER_SITE_OTHER): Payer: 59 | Admitting: Psychiatry

## 2014-01-02 ENCOUNTER — Encounter (HOSPITAL_COMMUNITY): Payer: Self-pay | Admitting: Psychiatry

## 2014-01-02 VITALS — BP 100/68 | Ht 64.0 in | Wt 104.0 lb

## 2014-01-02 DIAGNOSIS — F411 Generalized anxiety disorder: Secondary | ICD-10-CM

## 2014-01-02 DIAGNOSIS — F331 Major depressive disorder, recurrent, moderate: Secondary | ICD-10-CM

## 2014-01-02 DIAGNOSIS — F329 Major depressive disorder, single episode, unspecified: Secondary | ICD-10-CM

## 2014-01-02 MED ORDER — SERTRALINE HCL 100 MG PO TABS: 100.0000 mg | ORAL_TABLET | Freq: Every day | ORAL | Status: DC

## 2014-01-02 MED ORDER — DIAZEPAM 5 MG PO TABS
5.0000 mg | ORAL_TABLET | Freq: Four times a day (QID) | ORAL | Status: DC
Start: 2014-01-02 — End: 2014-02-02

## 2014-01-02 MED ORDER — ARIPIPRAZOLE 5 MG PO TABS: 5.0000 mg | ORAL_TABLET | Freq: Every day | ORAL | Status: DC
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
## 2014-01-05 ENCOUNTER — Telehealth (HOSPITAL_COMMUNITY): Payer: Self-pay | Admitting: *Deleted

## 2014-01-06 ENCOUNTER — Telehealth (HOSPITAL_COMMUNITY): Payer: Self-pay | Admitting: *Deleted

## 2014-01-06 NOTE — Telephone Encounter (Signed)
Left message to call back  

## 2014-01-06 NOTE — Telephone Encounter (Signed)
Will call in Xanax 1 mg qid

## 2014-01-08 ENCOUNTER — Other Ambulatory Visit (HOSPITAL_COMMUNITY): Payer: Self-pay | Admitting: Family Medicine

## 2014-01-08 ENCOUNTER — Ambulatory Visit (HOSPITAL_COMMUNITY)
Admission: RE | Admit: 2014-01-08 | Discharge: 2014-01-08 | Disposition: A | Discharge status: Home / Self Care | Payer: Disability Insurance | Source: Ambulatory Visit | Attending: Family Medicine | Admitting: Family Medicine

## 2014-01-08 DIAGNOSIS — M5136 Other intervertebral disc degeneration, lumbar region: Secondary | ICD-10-CM | POA: Insufficient documentation

## 2014-01-08 DIAGNOSIS — M545 Low back pain: Secondary | ICD-10-CM | POA: Insufficient documentation

## 2014-01-09 ENCOUNTER — Ambulatory Visit (HOSPITAL_COMMUNITY): Payer: Self-pay | Admitting: Psychiatry

## 2014-01-23 ENCOUNTER — Telehealth (HOSPITAL_COMMUNITY): Payer: Self-pay | Admitting: *Deleted

## 2014-01-23 ENCOUNTER — Telehealth (HOSPITAL_COMMUNITY): Payer: Self-pay | Admitting: Psychiatry

## 2014-01-23 ENCOUNTER — Ambulatory Visit (INDEPENDENT_AMBULATORY_CARE_PROVIDER_SITE_OTHER): Payer: 59 | Admitting: Psychiatry

## 2014-01-23 DIAGNOSIS — F411 Generalized anxiety disorder: Secondary | ICD-10-CM

## 2014-01-23 DIAGNOSIS — F331 Major depressive disorder, recurrent, moderate: Secondary | ICD-10-CM

## 2014-01-23 NOTE — Patient Instructions (Signed)
Discussed orally 
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
## 2014-01-23 NOTE — Telephone Encounter (Signed)
Pt calling and stating it's urgent that Peggy call her back. Per pt it is about what she and Gigi Gineggy talked about at today's appt and she have found out more information. Pt pt it is Urgent for Peggy to return her call. Pt number is [phone].

## 2014-02-02 ENCOUNTER — Ambulatory Visit (HOSPITAL_COMMUNITY): Payer: Self-pay | Admitting: Psychiatry

## 2014-02-02 ENCOUNTER — Encounter (HOSPITAL_COMMUNITY): Payer: Self-pay | Admitting: Psychiatry

## 2014-02-02 ENCOUNTER — Ambulatory Visit (INDEPENDENT_AMBULATORY_CARE_PROVIDER_SITE_OTHER): Payer: 59 | Admitting: Psychiatry

## 2014-02-02 VITALS — BP 111/51 | HR 71 | Ht 64.0 in | Wt 104.4 lb

## 2014-02-02 DIAGNOSIS — F411 Generalized anxiety disorder: Secondary | ICD-10-CM

## 2014-02-02 DIAGNOSIS — F329 Major depressive disorder, single episode, unspecified: Secondary | ICD-10-CM

## 2014-02-02 DIAGNOSIS — F331 Major depressive disorder, recurrent, moderate: Secondary | ICD-10-CM

## 2014-02-02 MED ORDER — ALPRAZOLAM 1 MG PO TABS: 1.0000 mg | ORAL_TABLET | Freq: Four times a day (QID) | ORAL | Status: DC

## 2014-02-02 MED ORDER — ARIPIPRAZOLE 5 MG PO TABS: 5.0000 mg | ORAL_TABLET | Freq: Every day | ORAL | Status: DC

## 2014-02-02 MED ORDER — SERTRALINE HCL 100 MG PO TABS: ORAL_TABLET | ORAL | Status: DC
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
## 2014-02-23 ENCOUNTER — Telehealth (HOSPITAL_COMMUNITY): Payer: Self-pay | Admitting: *Deleted

## 2014-02-23 ENCOUNTER — Telehealth (HOSPITAL_COMMUNITY): Payer: Self-pay | Admitting: Psychiatry
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
## 2014-02-23 NOTE — Telephone Encounter (Signed)
This is a legal issue that should only be handled by law enforcement, not a medical issue

## 2014-02-27 ENCOUNTER — Ambulatory Visit (INDEPENDENT_AMBULATORY_CARE_PROVIDER_SITE_OTHER): Payer: 59 | Admitting: Psychiatry

## 2014-02-27 DIAGNOSIS — F331 Major depressive disorder, recurrent, moderate: Secondary | ICD-10-CM

## 2014-02-27 DIAGNOSIS — F411 Generalized anxiety disorder: Secondary | ICD-10-CM
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
## 2014-02-27 NOTE — Patient Instructions (Signed)
Discussed orally 

## 2014-03-03 ENCOUNTER — Telehealth (HOSPITAL_COMMUNITY): Payer: Self-pay | Admitting: *Deleted

## 2014-03-03 NOTE — Telephone Encounter (Signed)
phone call from patient, left voice message that her daughter was back in her yard.   Need to speak with Dr. Tenny Crawoss or Gigi GinPeggy.
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
## 2014-03-04 NOTE — Telephone Encounter (Signed)
Pt is aware of Dr. Tenny Crawoss comment and pt stated she did call the police

## 2014-03-13 ENCOUNTER — Ambulatory Visit (HOSPITAL_COMMUNITY): Payer: Self-pay | Admitting: Psychiatry

## 2014-03-13 ENCOUNTER — Ambulatory Visit (INDEPENDENT_AMBULATORY_CARE_PROVIDER_SITE_OTHER): Payer: 59 | Admitting: Psychiatry

## 2014-03-13 DIAGNOSIS — F411 Generalized anxiety disorder: Secondary | ICD-10-CM

## 2014-03-13 DIAGNOSIS — F331 Major depressive disorder, recurrent, moderate: Secondary | ICD-10-CM
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
## 2014-03-13 NOTE — Patient Instructions (Signed)
Discussed orally 

## 2014-03-25 ENCOUNTER — Ambulatory Visit (HOSPITAL_COMMUNITY): Payer: 59 | Admitting: Psychiatry

## 2014-04-06 ENCOUNTER — Encounter (HOSPITAL_COMMUNITY): Payer: Self-pay | Admitting: Psychiatry

## 2014-04-06 ENCOUNTER — Ambulatory Visit (INDEPENDENT_AMBULATORY_CARE_PROVIDER_SITE_OTHER): Payer: 59 | Admitting: Psychiatry

## 2014-04-06 VITALS — BP 134/79 | HR 65 | Ht 64.0 in | Wt 103.8 lb

## 2014-04-06 DIAGNOSIS — F411 Generalized anxiety disorder: Secondary | ICD-10-CM

## 2014-04-06 DIAGNOSIS — F331 Major depressive disorder, recurrent, moderate: Secondary | ICD-10-CM

## 2014-04-06 DIAGNOSIS — F329 Major depressive disorder, single episode, unspecified: Secondary | ICD-10-CM

## 2014-04-06 MED ORDER — ALPRAZOLAM 1 MG PO TABS: 1.0000 mg | ORAL_TABLET | Freq: Four times a day (QID) | ORAL | Status: DC

## 2014-04-06 MED ORDER — SERTRALINE HCL 100 MG PO TABS: ORAL_TABLET | ORAL | Status: DC

## 2014-04-06 MED ORDER — ARIPIPRAZOLE 5 MG PO TABS: 5.0000 mg | ORAL_TABLET | Freq: Every day | ORAL | Status: DC
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
## 2014-04-10 ENCOUNTER — Telehealth (HOSPITAL_COMMUNITY): Payer: Self-pay | Admitting: *Deleted

## 2014-04-10 ENCOUNTER — Encounter (HOSPITAL_COMMUNITY): Payer: Self-pay | Admitting: Psychiatry

## 2014-04-10 ENCOUNTER — Ambulatory Visit (HOSPITAL_COMMUNITY): Payer: Self-pay | Admitting: Psychiatry

## 2014-04-10 NOTE — Telephone Encounter (Signed)
Letter dated 02/23/14, prepared by providers Dr. Tenny Crawoss and Gigi GinPeggy was not picked up by patient.   Expiration date for release of letter was 02/27/14.

## 2014-04-27 ENCOUNTER — Ambulatory Visit (INDEPENDENT_AMBULATORY_CARE_PROVIDER_SITE_OTHER): Payer: 59 | Admitting: Psychiatry

## 2014-04-27 DIAGNOSIS — F331 Major depressive disorder, recurrent, moderate: Secondary | ICD-10-CM

## 2014-04-27 DIAGNOSIS — F411 Generalized anxiety disorder: Secondary | ICD-10-CM

## 2014-04-27 NOTE — Patient Instructions (Signed)
Discussed orally 

## 2014-04-27 NOTE — Progress Notes (Signed)
       THERAPIST PROGRESS NOTE  Session Time: Monday 04/27/2014 9:55 AM - 11:50 AM  Participation Level: Active  Behavioral Response: CasualAlertAnxious/  Type of Therapy: Individual Therapy  Treatment Goals addressed:  Learn and implement coping skills that result in reduction of anxiety, panic attacks, and worry and improve daily functioning  Develop an awareness of angry thoughts feelings and actions along with triggers and learn alternatives to aggressive anger outbursts  Return to previous level of functioning including resuming normal interest in activities and increased motivation.    Interventions: CBT and Supportive  Summary: Heather Wang is a 54 y.o. female  who is referred for services by psychiatrist Dr. Tenny Crawoss to improve coping skills. Patient presents with a long standing history of symptoms of depression and anxiety since early childhood. She reports numerous hospitalizations from age 89 to age 89 due to a nonfunctioning right kidney. She eventually had surgery to have kidney remove. She also reports both mother and grandmother suffered from symptoms of anxiety and states learning some of her behaviors from them because they were always sick and worried. She says she has always had problems coping with daily life. She suffers from frequent panic attacks. She reports stress regarding her husband who was diagnosed with prostate and bladder cancer in 2008. Cancer has now spread to his bones. Patient experiences poor concentration and memory difficulty and states one of her daughters manages patient's and husband's medication as well as transport them to appointments  Patient reports multiple stressors since last session in December 2015. Her husband was hospitalized and his health continues to decline as he has stage IV cancer per patient's report. She states thinking he won't be around long and is overwhelmed regarding his care. She does have help from daughter and a friend but says  husband has no professional in- home care. Her daughter has helped [facility] but they haven't received a response yet.. She expresses some relief that daughter with substance abuse issues now is in prison in [place]. However, she still expresses frustration as she states thinking daughter will not change. She reports additional stress related to granddaughter's biological father threatening to pursue joint custody. Patient reports he did this after patient pursued and was awarded child support. She has contacted an attorney but fears he may obtain joint custody. Patient reports insomnia, excessive worry, panic attacks, memory difficulty, and continues to report waking up nervous daily. Patient admits poor self-care and decreased appetite. She continues to have emotional anger outbursts.  Suicidal/Homicidal: No  Therapist Response: Therapist works with patient to process feelings, review rationale for using relaxation breathing, encourage patient to increase self-care efforts, identify ways to use support system  Plan: Return again in 2 weeks. Patient agrees to practice relaxation breathing daily.  Diagnosis: Axis I: MDD. GAD    Axis II: Deferred    BYNUM,PEGGY, LCSW 04/27/2014

## 2014-05-18 ENCOUNTER — Ambulatory Visit (INDEPENDENT_AMBULATORY_CARE_PROVIDER_SITE_OTHER): Payer: 59 | Admitting: Psychiatry

## 2014-05-18 DIAGNOSIS — F331 Major depressive disorder, recurrent, moderate: Secondary | ICD-10-CM

## 2014-05-18 DIAGNOSIS — F411 Generalized anxiety disorder: Secondary | ICD-10-CM

## 2014-05-18 NOTE — Patient Instructions (Signed)
Discussed orally 

## 2014-05-18 NOTE — Progress Notes (Signed)
        THERAPIST PROGRESS NOTE  Session Time: Monday 05/18/2014 2:10 PM - 3:00 PM  Participation Level: Active  Behavioral Response: CasualAlertAnxious/  Type of Therapy: Individual Therapy  Treatment Goals addressed:  Learn and implement coping skills that result in reduction of anxiety, panic attacks, and worry and improve daily functioning  Develop an awareness of angry thoughts feelings and actions along with triggers and learn alternatives to aggressive anger outbursts  Return to previous level of functioning including resuming normal interest in activities and increased motivation.    Interventions: CBT and Supportive  Summary: Heather Wang is a 54 y.o. female  who is referred for services by psychiatrist Dr. Tenny Crawoss to improve coping skills. Patient presents with a long standing history of symptoms of depression and anxiety since early childhood. She reports numerous hospitalizations from age 89 to age 89 due to a nonfunctioning right kidney. She eventually had surgery to have kidney remove. She also reports both mother and grandmother suffered from symptoms of anxiety and states learning some of her behaviors from them because they were always sick and worried. She says she has always had problems coping with daily life. She suffers from frequent panic attacks. She reports stress regarding her husband who was diagnosed with prostate and bladder cancer in 2008. Cancer has now spread to his bones. Patient experiences poor concentration and memory difficulty and states one of her daughters manages patient's and husband's medication as well as transport them to appointments  Patient reports continued stress related to husband's health issues. She is pleased [place] now [place]. She reports his doctor has given authorization for Hospice care at home but this has not yet started. Patient reports sleep difficulty as she is constantly checking on husband. She  fears her 53 year old granddaughter may be the one who finds him when he dies. Patient no longer has assistance from her daughter on whom she depended. She has stopped communication with daughter as her children are not in the legal custody of DSS. Patient is able to continue to see the children as they have been placed with paternal relatives who live across the street from patient. Her middle daughter along with her 53-year-old child have moved back in with patient. She reports this is stressful as the 782 -year-old makes a lot of noise. Her other daughter has been moved from [place] to a [place] in [place]. Patient has talked with her on the phone and she claims she has changed. However, patient is skeptical and states waiting to see how she acts when she is released. Patient reports continued anxiety but has been trying to take a few moments for self in another room at home. She also has been going out to lunch or dinner occasionally with a friend. Patient reports additional stress related to a friend dying recently. Patient continues to have emotional anger outbursts.   Suicidal/Homicidal: No  Therapist Response: Therapist works with patient to process feelings, encourage patient to increase self-care efforts, identify ways to use support system  Plan: Return again in 2 weeks.   Diagnosis: Axis I: MDD. GAD    Axis II: Deferred    BYNUM,PEGGY, LCSW 05/18/2014

## 2014-06-05 ENCOUNTER — Ambulatory Visit (HOSPITAL_COMMUNITY): Payer: Self-pay | Admitting: Psychiatry

## 2014-06-08 ENCOUNTER — Ambulatory Visit (HOSPITAL_COMMUNITY): Payer: Self-pay | Admitting: Psychiatry

## 2014-06-15 ENCOUNTER — Encounter (HOSPITAL_COMMUNITY): Payer: Self-pay | Admitting: Psychiatry

## 2014-06-15 ENCOUNTER — Ambulatory Visit (INDEPENDENT_AMBULATORY_CARE_PROVIDER_SITE_OTHER): Payer: 59 | Admitting: Psychiatry

## 2014-06-15 VITALS — BP 102/55 | HR 64 | Ht 64.0 in | Wt 99.0 lb

## 2014-06-15 DIAGNOSIS — F411 Generalized anxiety disorder: Secondary | ICD-10-CM

## 2014-06-15 DIAGNOSIS — F331 Major depressive disorder, recurrent, moderate: Secondary | ICD-10-CM

## 2014-06-15 MED ORDER — LISDEXAMFETAMINE DIMESYLATE 30 MG PO CAPS: 30.0000 mg | ORAL_CAPSULE | ORAL | Status: DC

## 2014-06-15 MED ORDER — ARIPIPRAZOLE 5 MG PO TABS: 5.0000 mg | ORAL_TABLET | Freq: Every day | ORAL | Status: DC

## 2014-06-15 MED ORDER — ALPRAZOLAM 1 MG PO TABS: 1.0000 mg | ORAL_TABLET | Freq: Four times a day (QID) | ORAL | Status: DC

## 2014-06-15 MED ORDER — SERTRALINE HCL 100 MG PO TABS: ORAL_TABLET | ORAL | Status: DC
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
## 2014-06-19 ENCOUNTER — Ambulatory Visit (HOSPITAL_COMMUNITY): Payer: Self-pay | Admitting: Psychiatry

## 2014-06-19 ENCOUNTER — Telehealth (HOSPITAL_COMMUNITY): Payer: Self-pay | Admitting: *Deleted

## 2014-06-19 NOTE — Telephone Encounter (Signed)
phone call from patient.  new med, she don't know the name of it.   insurance needs to know why you prescribed this medicine.   It cost $200, she can't afford it.

## 2014-06-19 NOTE — Telephone Encounter (Signed)
Spoke to patient med is Vyvanse..please ask Lajoyce CornersOctavia to call pharmacy Monday to see what is going on

## 2014-06-22 NOTE — Telephone Encounter (Signed)
Spoke with Joe from pt pharmacy and he states that pt need to have Prior Auth for her Vyvanse. Per Gabriel RungJoe he will send the Prior Auth for to office.

## 2014-06-23 ENCOUNTER — Telehealth (HOSPITAL_COMMUNITY): Payer: Self-pay | Admitting: *Deleted

## 2014-06-23 NOTE — Telephone Encounter (Signed)
Called for prior authorization of Vyvanse. Spoke to Star[place]andy who gave approval until 06/23/15 Berkley Harveyauth #[id number]. Called [facility] to notify them and they did get it to go through. They will notify patient.

## 2014-07-03 ENCOUNTER — Telehealth (HOSPITAL_COMMUNITY): Payer: Self-pay | Admitting: *Deleted

## 2014-07-03 ENCOUNTER — Telehealth (HOSPITAL_COMMUNITY): Payer: Self-pay | Admitting: Psychiatry

## 2014-07-03 NOTE — Telephone Encounter (Signed)
phone call from patient, she misplaced her Xanax.  Stated her husband died yesterday.   She is about to have a panic attack.   Need enough to last until she can have the refill.

## 2014-07-03 NOTE — Telephone Encounter (Signed)
Tried to leave message, mailbox full. 

## 2014-07-10 ENCOUNTER — Ambulatory Visit (INDEPENDENT_AMBULATORY_CARE_PROVIDER_SITE_OTHER): Payer: 59 | Admitting: Psychiatry

## 2014-07-10 DIAGNOSIS — F411 Generalized anxiety disorder: Secondary | ICD-10-CM

## 2014-07-10 DIAGNOSIS — F331 Major depressive disorder, recurrent, moderate: Secondary | ICD-10-CM

## 2014-07-10 NOTE — Patient Instructions (Signed)
Discussed orally 
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
## 2014-07-15 ENCOUNTER — Ambulatory Visit (HOSPITAL_COMMUNITY): Payer: Self-pay | Admitting: Psychiatry

## 2014-07-30 ENCOUNTER — Telehealth (HOSPITAL_COMMUNITY): Payer: Self-pay | Admitting: *Deleted

## 2014-07-30 ENCOUNTER — Telehealth (HOSPITAL_COMMUNITY): Payer: Self-pay | Admitting: Psychiatry

## 2014-07-30 NOTE — Telephone Encounter (Signed)
Called to resch her appt and asked if her paperwork that was faxed to office was done because they was needing record of the people she see here. Informed pt that provider is seeing pts at this time and pt requested that provider call her. Pt [phone].

## 2014-07-30 NOTE — Telephone Encounter (Signed)
Therapist returned patient's call and left message requested paperwork had been faxed to the designated office.

## 2014-08-14 ENCOUNTER — Ambulatory Visit (HOSPITAL_COMMUNITY): Payer: Self-pay | Admitting: Psychiatry

## 2014-08-18 ENCOUNTER — Encounter (HOSPITAL_COMMUNITY): Payer: Self-pay | Admitting: Psychiatry

## 2014-08-18 ENCOUNTER — Ambulatory Visit (INDEPENDENT_AMBULATORY_CARE_PROVIDER_SITE_OTHER): Payer: 59 | Admitting: Psychiatry

## 2014-08-18 VITALS — BP 99/49 | HR 81 | Ht 64.0 in | Wt 93.4 lb

## 2014-08-18 DIAGNOSIS — F331 Major depressive disorder, recurrent, moderate: Secondary | ICD-10-CM | POA: Diagnosis not present

## 2014-08-18 MED ORDER — ALPRAZOLAM 1 MG PO TABS: 1.0000 mg | ORAL_TABLET | Freq: Four times a day (QID) | ORAL | Status: DC

## 2014-08-18 MED ORDER — LISDEXAMFETAMINE DIMESYLATE 30 MG PO CAPS: 30.0000 mg | ORAL_CAPSULE | ORAL | Status: DC

## 2014-08-18 MED ORDER — ARIPIPRAZOLE 5 MG PO TABS: 5.0000 mg | ORAL_TABLET | Freq: Every day | ORAL | Status: DC

## 2014-08-18 MED ORDER — SERTRALINE HCL 100 MG PO TABS: ORAL_TABLET | ORAL | Status: DC
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
## 2014-08-20 ENCOUNTER — Ambulatory Visit (INDEPENDENT_AMBULATORY_CARE_PROVIDER_SITE_OTHER): Payer: 59 | Admitting: Psychiatry

## 2014-08-20 DIAGNOSIS — F331 Major depressive disorder, recurrent, moderate: Secondary | ICD-10-CM | POA: Diagnosis not present

## 2014-08-20 DIAGNOSIS — F411 Generalized anxiety disorder: Secondary | ICD-10-CM | POA: Diagnosis not present
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
## 2014-08-21 NOTE — Patient Instructions (Signed)
Discussed orally 

## 2014-09-11 ENCOUNTER — Ambulatory Visit (HOSPITAL_COMMUNITY): Payer: Self-pay | Admitting: Psychiatry

## 2014-09-21 ENCOUNTER — Telehealth (HOSPITAL_COMMUNITY): Payer: Self-pay | Admitting: *Deleted

## 2014-09-21 NOTE — Telephone Encounter (Signed)
Moshe Ciproarmen Villanueva (on pt release) called to verify pt appt due to her not being able to remember her appts. Verified appt with Porfirio Mylararmen and asked Porfirio MylarCarmen if pt was around and she stated that pt was sitting next to her.

## 2014-09-22 ENCOUNTER — Ambulatory Visit (INDEPENDENT_AMBULATORY_CARE_PROVIDER_SITE_OTHER): Payer: 59 | Admitting: Psychiatry

## 2014-09-22 DIAGNOSIS — F411 Generalized anxiety disorder: Secondary | ICD-10-CM | POA: Diagnosis not present

## 2014-09-22 DIAGNOSIS — F331 Major depressive disorder, recurrent, moderate: Secondary | ICD-10-CM

## 2014-09-22 NOTE — Progress Notes (Signed)
         THERAPIST PROGRESS NOTE  Session Time: Tuesday 09/22/2014 1:10 PM - 1:55 PM  Participation Level: Active  Behavioral Response: CasualAlertAnxious/  Type of Therapy: Individual Therapy  Treatment Goals addressed:  Learn and implement coping skills that result in reduction of anxiety, panic attacks, and worry and improve daily functioning  Develop an awareness of angry thoughts feelings and actions along with triggers and learn alternatives to aggressive anger outbursts  Return to previous level of functioning including resuming normal interest in activities and increased motivation.    Interventions: CBT and Supportive  Summary: Heather Wang is a 54 y.o. female  who is referred for services by psychiatrist Dr. Tenny Crawoss to improve coping skills. Patient presents with a long standing history of symptoms of depression and anxiety since early childhood. She reports numerous hospitalizations from age 89 to age 89 due to a nonfunctioning right kidney. She eventually had surgery to have kidney remove. She also reports both mother and grandmother suffered from symptoms of anxiety and states learning some of her behaviors from them because they were always sick and worried. She says she has always had problems coping with daily life. She suffers from frequent panic attacks. She reports stress regarding her husband who was diagnosed with prostate and bladder cancer in 2008. Cancer has now spread to his bones. Patient experiences poor concentration and memory difficulty and states one of her daughters manages patient's and husband's medication as well as transport them to appointments  Patient reports continued anxiety since last session. She is pleased DSS has closed case regarding there granddaughter. She expresses worry now about her finances as she no longer has income from husband's VA benefits and disability benefits. She has applied for widow's pension but has informed VA is behind on  processing claims. She has been denied for disability benefits for self but is working with an attorney regarding appeal. She is receiving food stamp assistance and child support check for granddaughter. She also reports frustration with all three daughters and has no contact with any of them. DSS recently took daughter's two children from her care but patient gets to see them as they are placed with their paternal stepgrandmother. She continues to worry about her 53 year old grandson who remains with his mother. Patient reports continuing to report concerns to DSS but says nothing has been done. She and oldest granddaughter visited patient's husband grave Memorial Day. Patient shares pictures of visit with therapist. Patient misses husband but states still having no time to grieve as she is very angry and frustrated with her 3 daughters.  She does have continued support from her brother, sister-in-law, and a friend. Patient reports difficulty asking for help.   Suicidal/Homicidal: No  Therapist Response: Therapist works with patient to ventilate and validate feelings,  identify and challenge thinking errors, identify ways to use support system, identify coping statements  Plan: Return again in 2 weeks.   Diagnosis: Axis I: MDD. GAD    Axis II: Deferred    Dorlis Judice, LCSW 09/22/2014

## 2014-09-22 NOTE — Patient Instructions (Signed)
Discussed orally 

## 2014-10-08 ENCOUNTER — Ambulatory Visit (INDEPENDENT_AMBULATORY_CARE_PROVIDER_SITE_OTHER): Payer: 59 | Admitting: Psychiatry

## 2014-10-08 DIAGNOSIS — F331 Major depressive disorder, recurrent, moderate: Secondary | ICD-10-CM

## 2014-10-08 DIAGNOSIS — F411 Generalized anxiety disorder: Secondary | ICD-10-CM

## 2014-10-08 NOTE — Patient Instructions (Signed)
Discussed orally 

## 2014-10-08 NOTE — Progress Notes (Signed)
          THERAPIST PROGRESS NOTE  Session Time: Thursday 10/08/2014  Participation Level: Active  Behavioral Response: CasualAlertAnxious/ tearful at times  Type of Therapy: Individual Therapy  Treatment Goals addressed:  Learn and implement coping skills that result in reduction of anxiety, panic attacks, and worry and improve daily functioning  Develop an awareness of angry thoughts feelings and actions along with triggers and learn alternatives to aggressive anger outbursts  Return to previous level of functioning including resuming normal interest in activities and increased motivation.    Interventions: CBT and Supportive  Summary: Heather Wang is a 54 y.o. female  who is referred for services by psychiatrist Dr. Tenny Crawoss to improve coping skills. Patient presents with a long standing history of symptoms of depression and anxiety since early childhood. She reports numerous hospitalizations from age 89 to age 89 due to a nonfunctioning right kidney. She eventually had surgery to have kidney remove. She also reports both mother and grandmother suffered from symptoms of anxiety and states learning some of her behaviors from them because they were always sick and worried. She says she has always had problems coping with daily life. She suffers from frequent panic attacks. She reports stress regarding her husband who was diagnosed with prostate and bladder cancer in 2008. Cancer has now spread to his bones. Patient experiences poor concentration and memory difficulty and states one of her daughters manages patient's and husband's medication as well as transport them to appointments  Patient reports continued anxiety since last session. She is very worried about finances. She has contacted VA again to try to expedite process to receive widow's pension. She expresses anger and frustration she was informed there was nothing that could be done. She also expresses guilt and frustration she can't  take granddaughter out to eat or give her money to go the movies like she used to before patient's husband died. She expresses fear about being on her own to manage business and finances as she married when she was very young and her husband always took care of her per her report. She has support from brother and others but states she just doesn't like asking others for help. She states feeling ashamed to ask for help and admits fearing rejection. She also sees asking for help as a sign of weakness. Patient also is worried about her granddaughter beginning high school in August. She fears granddaughter may be negatively influenced by her peers.   Suicidal/Homicidal: No  Therapist Response: Therapist works with patient to ventilate and validate feelings, identify her successes in managing matters since husband's death, identify and challenge thinking errors regarding asking for help, identify ways to reduce excessive worry by establishing  specific time to worry, review relaxation and distracting activities.   Plan: Return again in 2 weeks.   Diagnosis: Axis I: MDD. GAD    Axis II: Deferred    Lilyian Quayle, LCSW 10/08/2014

## 2014-10-16 ENCOUNTER — Ambulatory Visit (INDEPENDENT_AMBULATORY_CARE_PROVIDER_SITE_OTHER): Payer: 59 | Admitting: Psychiatry

## 2014-10-16 ENCOUNTER — Encounter (HOSPITAL_COMMUNITY): Payer: Self-pay | Admitting: Psychiatry

## 2014-10-16 VITALS — BP 92/58 | HR 71 | Ht 64.0 in | Wt 92.4 lb

## 2014-10-16 DIAGNOSIS — F411 Generalized anxiety disorder: Secondary | ICD-10-CM

## 2014-10-16 DIAGNOSIS — F329 Major depressive disorder, single episode, unspecified: Secondary | ICD-10-CM

## 2014-10-16 DIAGNOSIS — F331 Major depressive disorder, recurrent, moderate: Secondary | ICD-10-CM

## 2014-10-16 MED ORDER — ALPRAZOLAM 1 MG PO TABS: 1.0000 mg | ORAL_TABLET | Freq: Four times a day (QID) | ORAL | Status: DC

## 2014-10-16 MED ORDER — OLANZAPINE 5 MG PO TABS: 5.0000 mg | ORAL_TABLET | Freq: Every day | ORAL | Status: DC

## 2014-10-16 MED ORDER — SERTRALINE HCL 100 MG PO TABS: 100.0000 mg | ORAL_TABLET | Freq: Every day | ORAL | Status: DC
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
## 2014-10-22 ENCOUNTER — Ambulatory Visit (INDEPENDENT_AMBULATORY_CARE_PROVIDER_SITE_OTHER): Payer: 59 | Admitting: Psychiatry

## 2014-10-22 DIAGNOSIS — F411 Generalized anxiety disorder: Secondary | ICD-10-CM

## 2014-10-22 DIAGNOSIS — F331 Major depressive disorder, recurrent, moderate: Secondary | ICD-10-CM

## 2014-10-22 NOTE — Progress Notes (Signed)
          THERAPIST PROGRESS NOTE  Session Time: Thursday 10/22/2014 11:10 AM - 12:00 PM  Participation Level: Active  Behavioral Response: CasualAlertAnxious/ tearful at times  Type of Therapy: Individual Therapy  Treatment Goals addressed:  Learn and implement coping skills that result in reduction of anxiety, panic attacks, and worry and improve daily functioning  Develop an awareness of angry thoughts feelings and actions along with triggers and learn alternatives to aggressive anger outbursts  Return to previous level of functioning including resuming normal interest in activities and increased motivation.    Interventions: CBT and Supportive  Summary: Heather Wang is a 54 y.o. female  who is referred for services by psychiatrist Dr. Tenny Craw to improve coping skills. Patient presents with a long standing history of symptoms of depression and anxiety since early childhood. She reports numerous hospitalizations from age 25 to age 55 due to a nonfunctioning right kidney. She eventually had surgery to have kidney remove. She also reports both mother and grandmother suffered from symptoms of anxiety and states learning some of her behaviors from them because they were always sick and worried. She says she has always had problems coping with daily life. She suffers from frequent panic attacks. She reports stress regarding her husband who was diagnosed with prostate and bladder cancer in 2008. Cancer has now spread to his bones. Patient experiences poor concentration and memory difficulty and states one of her daughters manages patient's and husband's medication as well as transport them to appointments  Patient reports continued anxiety since last session. She continues to worry about finances. She is getting some help with costs for medication by getting samples and she has gone to [employer] to get food. She reports being more depressed, sleeping excessively, having no appetite, and little to  no interest in activities this past week. She also began experiencing anger and frustration with husband because he did not take out an Brewing technologist as she had tried to encourage him to do and left her and their granddaughter in this situation. She also continues to experience anger with daughter who stole from them. She reports additional stress related to two of her grandchildren being in foster care and having limited contact with them. She also is experiencing some anxiety about her upcoming birthday this Sunday.  Suicidal/Homicidal: No  Therapist Response: Therapist works with patient to ventilate and validate feelings, began to discuss stages of grief, identify ways to improve self-care regarding nutrition, identify ways to cope with her birthday this Sunday  Plan: Return again in 2 weeks.   Diagnosis: Axis I: MDD. GAD    Axis II: Deferred    [name], LCSW 10/22/2014

## 2014-10-22 NOTE — Patient Instructions (Signed)
Discussed orally 

## 2014-11-05 ENCOUNTER — Telehealth (HOSPITAL_COMMUNITY): Payer: Self-pay | Admitting: *Deleted

## 2014-11-05 NOTE — Telephone Encounter (Signed)
Phone call from patient states she needs Dr. Tenny Craw to write letter to go to [facility] stating that she needs these treatments.   She needs this in order to speed up her widow's pension.

## 2014-11-06 NOTE — Telephone Encounter (Signed)
Phone call from pt stating she need Dr. Tenny Craw to write a letter to [facility] stating that she need the treatments that she is receiving. Per pt, she wants wants to speed up the widow's pension process.

## 2014-11-10 NOTE — Telephone Encounter (Signed)
Please route to Octavia to call pt to clarify. Does she not have insurance to cover visits?

## 2014-11-13 ENCOUNTER — Ambulatory Visit (INDEPENDENT_AMBULATORY_CARE_PROVIDER_SITE_OTHER): Payer: 59 | Admitting: Psychiatry

## 2014-11-13 ENCOUNTER — Encounter (HOSPITAL_COMMUNITY): Payer: Self-pay | Admitting: Psychiatry

## 2014-11-13 DIAGNOSIS — F411 Generalized anxiety disorder: Secondary | ICD-10-CM | POA: Diagnosis not present

## 2014-11-13 DIAGNOSIS — F331 Major depressive disorder, recurrent, moderate: Secondary | ICD-10-CM | POA: Diagnosis not present

## 2014-11-13 NOTE — Patient Instructions (Signed)
Discussed orally 

## 2014-11-13 NOTE — Progress Notes (Signed)
          THERAPIST PROGRESS NOTE  Session Time: Friday 11/13/2014 11:18 AM - 12:20 PM  Participation Level: Active  Behavioral Response: CasualAlertAnxious/ tearful at times  Type of Therapy: Individual Therapy  Treatment Goals:     1. Learn and implement coping skills that result in reduction of anxiety, panic attacks, and worry and improve daily functioning      2. Develop an awareness of angry thoughts feelings and actions along with triggers and learn alternatives to aggressive anger outbursts      3. Return to previous level of functioning including resuming normal interest in activities and increased motivation.   Treatment Goals addressed:  1  Interventions: CBT and Supportive  Summary: Heather Wang is a 54 y.o. female  who is referred for services by psychiatrist Dr. Tenny Craw to improve coping skills. Patient presents with a long standing history of symptoms of depression and anxiety since early childhood. She reports numerous hospitalizations from age 73 to age 68 due to a nonfunctioning right kidney. She eventually had surgery to have kidney remove. She also reports both mother and grandmother suffered from symptoms of anxiety and states learning some of her behaviors from them because they were always sick and worried. She says she has always had problems coping with daily life. She suffers from frequent panic attacks. She reports stress regarding her husband who was diagnosed with prostate and bladder cancer in 2008. Cancer has now spread to his bones. Patient experiences poor concentration and memory difficulty and states one of her daughters manages patient's and husband's medication as well as transport them to appointments  Patient reports relief regarding financial stress as she has been approved for widow's pension. She also has enrolled in auto bill pay at her bank to reduce worry related to remembering to pay bills. She continues to experiences stress and worry about her  grandchildren who remain in foster care. She reports continued support from her brother and her friend and expresses increased acceptance of help. Patient celebrated birthday with brother and his wife along with her granddaughter. She reports thoughts of deceased husband and experiencing sadness on that day as well. This also triggered grief and loss issues regarding her relationship with her daughters. Patient states it seems as though family feel apart when husband died.   Suicidal/Homicidal: No  Therapist Response: Therapist works with patient to ventilate and validate feelings, continue to discuss stages of grief, identify the role of expressing feelings in overcoming grief  Plan: Return again in 2 weeks.   Diagnosis: Axis I: MDD. GAD    Axis II: Deferred    Anan Dapolito, LCSW 11/13/2014

## 2014-11-16 ENCOUNTER — Encounter (HOSPITAL_COMMUNITY): Payer: Self-pay | Admitting: Psychiatry

## 2014-11-16 NOTE — Telephone Encounter (Signed)
Pt wanted to see if either Dr. Tenny Craw or Gigi Gin could write a letter stating that she still need services from this office. Per pt, she thinks this might speed up her process with her attorney for her pension. Pt number is [phone]

## 2014-11-17 NOTE — Telephone Encounter (Signed)
Pt is aware and shows understanding that letter is ready for pick up. Per pt once she gets her car back from being fixed, she will come to pick up printed letter.

## 2014-11-17 NOTE — Telephone Encounter (Signed)
done

## 2014-11-24 ENCOUNTER — Ambulatory Visit (HOSPITAL_COMMUNITY): Payer: Self-pay | Admitting: Psychiatry

## 2014-11-27 ENCOUNTER — Ambulatory Visit (HOSPITAL_COMMUNITY): Payer: Self-pay | Admitting: Psychiatry

## 2014-12-21 ENCOUNTER — Ambulatory Visit (INDEPENDENT_AMBULATORY_CARE_PROVIDER_SITE_OTHER): Payer: 59 | Admitting: Psychiatry

## 2014-12-21 ENCOUNTER — Encounter (HOSPITAL_COMMUNITY): Payer: Self-pay | Admitting: Psychiatry

## 2014-12-21 DIAGNOSIS — F411 Generalized anxiety disorder: Secondary | ICD-10-CM

## 2014-12-21 DIAGNOSIS — F331 Major depressive disorder, recurrent, moderate: Secondary | ICD-10-CM

## 2014-12-21 NOTE — Patient Instructions (Signed)
Discussed orally 

## 2014-12-21 NOTE — Progress Notes (Signed)
          THERAPIST PROGRESS NOTE  Session Time:  Monday 12/21/2014  9:05 AM - 10:05 AM  Participation Level: Active     Behavioral Response: CasualAlertAnxious/   Type of Therapy: Individual Therapy  Treatment Goals:     1. Learn and implement calming and coping strategies to reduce anxiety and emotional anger outbursts.       2. Identify, challenge, and replace negative fearful self-talk that contributes to excessive worry.     3. Identify, challenge , and replace self talk that evokes anger outbursts.   Treatment Goals addressed:  1,2,3  Interventions: CBT and Supportive  Summary: Heather Wang is a 54 y.o. female  who is referred for services by psychiatrist Dr. Tenny Craw to improve coping skills. Patient presents with a long standing history of symptoms of depression and anxiety since early childhood. She reports numerous hospitalizations from age 25 to age 55 due to a nonfunctioning right kidney. She eventually had surgery to have kidney remove. She also reports both mother and grandmother suffered from symptoms of anxiety and states learning some of her behaviors from them because they were always sick and worried. She says she has always had problems coping with daily life. She suffers from frequent panic attacks. She reports stress regarding her husband who was diagnosed with prostate and bladder cancer in 2008. Cancer has now spread to his bones. Patient experiences poor concentration and memory difficulty and states one of her daughters manages patient's and husband's medication as well as transport them to appointments  Patient reports enjoying recent beach trip with her family. However, she continues to have excessive worry about her grandchildren and her children. She also reports continued anxiety and becoming angry easily. She expresses frustration regarding daughters' choices and worries about grandchildren's future. She reports continued support from her brother and her  neighbors and expresses increased acceptance of help.  Suicidal/Homicidal: No  Therapist Response: Therapist works with patient to ventilate and validate feelings, identify strengths, identify support, develop treatment plan, begin to discuss the effects of trauma history on emotional regulation, identify connection between thought patterns/mood/behavior, encourage patient to journal  Plan: Return again in 2 weeks. Patient agrees to journal.  Diagnosis: Axis I: MDD. GAD    Axis II: Deferred    BYNUM,PEGGY, LCSW 12/21/2014

## 2014-12-22 ENCOUNTER — Ambulatory Visit (INDEPENDENT_AMBULATORY_CARE_PROVIDER_SITE_OTHER): Payer: 59 | Admitting: Psychiatry

## 2014-12-22 ENCOUNTER — Encounter (HOSPITAL_COMMUNITY): Payer: Self-pay | Admitting: Psychiatry

## 2014-12-22 VITALS — BP 106/66 | HR 63 | Ht 64.0 in | Wt 95.0 lb

## 2014-12-22 DIAGNOSIS — F411 Generalized anxiety disorder: Secondary | ICD-10-CM

## 2014-12-22 DIAGNOSIS — F331 Major depressive disorder, recurrent, moderate: Secondary | ICD-10-CM | POA: Diagnosis not present

## 2014-12-22 MED ORDER — SERTRALINE HCL 100 MG PO TABS: 100.0000 mg | ORAL_TABLET | Freq: Every day | ORAL | Status: DC

## 2014-12-22 MED ORDER — OLANZAPINE 5 MG PO TABS: 5.0000 mg | ORAL_TABLET | Freq: Every day | ORAL | Status: DC

## 2014-12-22 MED ORDER — ALPRAZOLAM 1 MG PO TABS: 1.0000 mg | ORAL_TABLET | Freq: Four times a day (QID) | ORAL | Status: DC
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
## 2015-01-05 ENCOUNTER — Encounter (HOSPITAL_COMMUNITY): Payer: Self-pay | Admitting: Psychiatry

## 2015-01-05 ENCOUNTER — Ambulatory Visit (INDEPENDENT_AMBULATORY_CARE_PROVIDER_SITE_OTHER): Payer: 59 | Admitting: Psychiatry

## 2015-01-05 DIAGNOSIS — F331 Major depressive disorder, recurrent, moderate: Secondary | ICD-10-CM | POA: Diagnosis not present

## 2015-01-05 DIAGNOSIS — F411 Generalized anxiety disorder: Secondary | ICD-10-CM | POA: Diagnosis not present

## 2015-01-05 NOTE — Progress Notes (Signed)
           THERAPIST PROGRESS NOTE  Session Time:   Tuesday 01/05/2015  9:05 - 10:00 AM      Participation Level: Active     Behavioral Response: CasualAlertAnxious/ very talkative  Type of Therapy: Individual Therapy  Treatment Goals:     1. Learn and implement calming and coping strategies to reduce anxiety and emotional anger outbursts.       2. Identify, challenge, and replace negative fearful self-talk that contributes to excessive worry.     3. Identify, challenge , and replace self talk that evokes anger outbursts.   Treatment Goals addressed:  1,2,3  Interventions: CBT and Supportive  Summary: Heather Wang is a 54 y.o. female  who is referred for services by psychiatrist Dr. Tenny Craw to improve coping skills. Patient presents with a long standing history of symptoms of depression and anxiety since early childhood. She reports numerous hospitalizations from age 66 to age 4 due to a nonfunctioning right kidney. She eventually had surgery to have kidney remove. She also reports both mother and grandmother suffered from symptoms of anxiety and states learning some of her behaviors from them because they were always sick and worried. She says she has always had problems coping with daily life. She suffers from frequent panic attacks. She reports stress regarding her husband who was diagnosed with prostate and bladder cancer in 2008. Cancer has now spread to his bones. Patient experiences poor concentration and memory difficulty and states one of her daughters manages patient's and husband's medication as well as transport them to appointments  Patient reports less depressed mood but continued anxiety since last session. She reports stress related to interaction with 56 year old granddaughter who can be argumentative especially when patient is unable to buy items granddaughter is accustomed to having. Patient continues to worry about granddaughter's future and admits fears of  granddaughter having negative behaviors like her mother and her aunt. Patient reports continued anger outbursts and states mainly having these at the grocery store. She says she has been trying to use focused breathing to cope with stress. She also has been journaling but forgot to bring to session. Patient has been more active and has been socializing with family and friends. She also is pleased she is helping brother apply for increased  Veteran's benefits.  Suicidal/Homicidal: No  Therapist Response: Therapist works with patient to review ventilate and validate feelings, identify ways to set boundaries in relationship with granddaughter, discuss being assertive versus being aggressive,  identify effects of trauma history on patient's overreaction, identify connection between thought patterns/mood/behavior, encourage patient to journal  Plan: Return again in 2 weeks. Patient agrees to journal and bring to next session  Diagnosis: Axis I: MDD. GAD    Axis II: Deferred    Amany Rando, LCSW 01/05/2015

## 2015-01-05 NOTE — Patient Instructions (Signed)
Discussed orally 

## 2015-01-19 ENCOUNTER — Ambulatory Visit (HOSPITAL_COMMUNITY): Payer: Self-pay | Admitting: Psychiatry

## 2015-02-02 ENCOUNTER — Ambulatory Visit (INDEPENDENT_AMBULATORY_CARE_PROVIDER_SITE_OTHER): Payer: 59 | Admitting: Psychiatry

## 2015-02-02 ENCOUNTER — Encounter (HOSPITAL_COMMUNITY): Payer: Self-pay | Admitting: Psychiatry

## 2015-02-02 DIAGNOSIS — F411 Generalized anxiety disorder: Secondary | ICD-10-CM | POA: Diagnosis not present

## 2015-02-02 DIAGNOSIS — F331 Major depressive disorder, recurrent, moderate: Secondary | ICD-10-CM
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
## 2015-02-02 NOTE — Patient Instructions (Signed)
Discussed orally 

## 2015-03-01 ENCOUNTER — Encounter (HOSPITAL_COMMUNITY): Payer: Self-pay | Admitting: Psychiatry

## 2015-03-01 ENCOUNTER — Ambulatory Visit (INDEPENDENT_AMBULATORY_CARE_PROVIDER_SITE_OTHER): Payer: 59 | Admitting: Psychiatry

## 2015-03-01 VITALS — BP 117/54 | HR 82 | Ht 64.0 in | Wt 95.6 lb

## 2015-03-01 DIAGNOSIS — F331 Major depressive disorder, recurrent, moderate: Secondary | ICD-10-CM

## 2015-03-01 DIAGNOSIS — F411 Generalized anxiety disorder: Secondary | ICD-10-CM | POA: Diagnosis not present

## 2015-03-01 MED ORDER — ALPRAZOLAM 1 MG PO TABS: 1.0000 mg | ORAL_TABLET | Freq: Four times a day (QID) | ORAL | Status: DC

## 2015-03-01 MED ORDER — OLANZAPINE 5 MG PO TABS: 5.0000 mg | ORAL_TABLET | Freq: Every day | ORAL | Status: DC

## 2015-03-01 MED ORDER — SERTRALINE HCL 100 MG PO TABS: 200.0000 mg | ORAL_TABLET | Freq: Every day | ORAL | Status: DC
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
## 2015-03-05 ENCOUNTER — Ambulatory Visit (INDEPENDENT_AMBULATORY_CARE_PROVIDER_SITE_OTHER): Payer: 59 | Admitting: Psychiatry

## 2015-03-05 ENCOUNTER — Encounter (HOSPITAL_COMMUNITY): Payer: Self-pay | Admitting: Psychiatry

## 2015-03-05 DIAGNOSIS — F411 Generalized anxiety disorder: Secondary | ICD-10-CM | POA: Diagnosis not present

## 2015-03-05 DIAGNOSIS — F331 Major depressive disorder, recurrent, moderate: Secondary | ICD-10-CM | POA: Diagnosis not present

## 2015-03-05 NOTE — Progress Notes (Signed)
           THERAPIST PROGRESS NOTE  Session Time:     Friday 03/05/2015 11:05 AM -  12:00 PM             Participation Level: Active     Behavioral Response: CasualAlertAnxious/ very talkative/depressed  Type of Therapy: Individual Therapy  Treatment Goals:     1. Learn and implement calming and coping strategies to reduce anxiety and emotional anger outbursts.       2. Identify, challenge, and replace negative fearful self-talk that contributes to excessive worry.     3. Identify, challenge , and replace self talk that evokes anger outbursts.   Treatment Goals addressed:  1  Interventions: CBT and Supportive  Summary: Heather Wang is a 54 y.o. female  who is referred for services by psychiatrist Dr. Tenny Crawoss to improve coping skills. Patient presents with a long standing history of symptoms of depression and anxiety since early childhood. She reports numerous hospitalizations from age 89 to age 89 due to a nonfunctioning right kidney. She eventually had surgery to have kidney remove. She also reports both mother and grandmother suffered from symptoms of anxiety and states learning some of her behaviors from them because they were always sick and worried. She says she has always had problems coping with daily life. She suffers from frequent panic attacks. She reports stress regarding her husband who was diagnosed with prostate and bladder cancer in 2008. Cancer has now spread to his bones. Patient experiences poor concentration and memory difficulty and states one of her daughters manages patient's and husband's medication as well as transport them to appointments  Patient reports continued anxiety, panic attacks, excessive worry, hypersomnia,  and depressed mood since last session. She constantly worries about 53 year old granddaughter and fears she may follow similar negative paths patient's daughters have taken although granddaughter is exhibiting positive behaviors. Patient continues to  experience grief/loss issues and reports becoming very depressed when alone. She is grieving the loss of her husband and the loss of her family. She reports hypersomnia. She continues to worry about finances but is less worried about items for her granddaughter as her paternal grandmother has been more involved in granddaughter's life. Patient reports worry about spiritual issues regarding husband's salvation but states she has been talking to a pastor which has been helpful.    Suicidal/Homicidal: No  Therapist Response: Therapist works with patient to process grief and loss issues facilitating expression of anger, guilt, fear and to dispel inappropriate guilt, identify and challenge negative thoughts, discuss resources and possibility of attending a grief support group., identify memorializations to use to cope during Christmas  Plan: Return again in 2 weeks.   Diagnosis: Axis I: MDD. GAD    Axis II: Deferred    Greidy Sherard, LCSW 03/05/2015

## 2015-03-05 NOTE — Patient Instructions (Signed)
Discussed orally 

## 2015-03-18 ENCOUNTER — Ambulatory Visit (HOSPITAL_COMMUNITY): Payer: Self-pay | Admitting: Psychiatry

## 2015-04-16 ENCOUNTER — Ambulatory Visit (HOSPITAL_COMMUNITY): Payer: Self-pay | Admitting: Psychiatry

## 2015-04-29 ENCOUNTER — Ambulatory Visit (HOSPITAL_COMMUNITY): Payer: Self-pay | Admitting: Psychiatry

## 2015-04-30 ENCOUNTER — Ambulatory Visit (HOSPITAL_COMMUNITY): Payer: Self-pay | Admitting: Psychiatry

## 2015-05-03 ENCOUNTER — Ambulatory Visit (HOSPITAL_COMMUNITY): Payer: Self-pay | Admitting: Psychiatry

## 2015-05-03 ENCOUNTER — Encounter (HOSPITAL_COMMUNITY): Payer: Self-pay | Admitting: *Deleted

## 2015-05-13 ENCOUNTER — Ambulatory Visit (HOSPITAL_COMMUNITY): Payer: Self-pay | Admitting: Psychiatry

## 2015-05-20 ENCOUNTER — Ambulatory Visit (INDEPENDENT_AMBULATORY_CARE_PROVIDER_SITE_OTHER): Payer: BLUE CROSS/BLUE SHIELD | Admitting: Psychiatry

## 2015-05-20 ENCOUNTER — Encounter (HOSPITAL_COMMUNITY): Payer: Self-pay | Admitting: Psychiatry

## 2015-05-20 DIAGNOSIS — F331 Major depressive disorder, recurrent, moderate: Secondary | ICD-10-CM | POA: Diagnosis not present

## 2015-05-20 DIAGNOSIS — F411 Generalized anxiety disorder: Secondary | ICD-10-CM | POA: Diagnosis not present
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
## 2015-05-20 NOTE — Patient Instructions (Signed)
Discussed orally 

## 2015-05-24 ENCOUNTER — Ambulatory Visit (INDEPENDENT_AMBULATORY_CARE_PROVIDER_SITE_OTHER): Payer: BLUE CROSS/BLUE SHIELD | Admitting: Psychiatry

## 2015-05-24 ENCOUNTER — Encounter (HOSPITAL_COMMUNITY): Payer: Self-pay | Admitting: Psychiatry

## 2015-05-24 VITALS — BP 96/59 | HR 74 | Ht 64.0 in | Wt 98.8 lb

## 2015-05-24 DIAGNOSIS — F331 Major depressive disorder, recurrent, moderate: Secondary | ICD-10-CM

## 2015-05-24 DIAGNOSIS — F411 Generalized anxiety disorder: Secondary | ICD-10-CM

## 2015-05-24 MED ORDER — ALPRAZOLAM 1 MG PO TABS: 1.0000 mg | ORAL_TABLET | Freq: Four times a day (QID) | ORAL | Status: DC

## 2015-05-24 MED ORDER — OLANZAPINE 5 MG PO TABS: 5.0000 mg | ORAL_TABLET | Freq: Every day | ORAL | Status: DC

## 2015-05-24 MED ORDER — SERTRALINE HCL 100 MG PO TABS: 200.0000 mg | ORAL_TABLET | Freq: Every day | ORAL | Status: DC
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
## 2015-06-10 ENCOUNTER — Ambulatory Visit (HOSPITAL_COMMUNITY): Payer: Self-pay | Admitting: Psychiatry

## 2015-07-05 ENCOUNTER — Ambulatory Visit (HOSPITAL_COMMUNITY): Payer: Self-pay | Admitting: Psychiatry

## 2015-07-27 DIAGNOSIS — E042 Nontoxic multinodular goiter: Secondary | ICD-10-CM | POA: Insufficient documentation

## 2015-07-27 DIAGNOSIS — F411 Generalized anxiety disorder: Secondary | ICD-10-CM | POA: Insufficient documentation

## 2015-07-27 DIAGNOSIS — E785 Hyperlipidemia, unspecified: Secondary | ICD-10-CM | POA: Insufficient documentation

## 2015-07-27 DIAGNOSIS — K219 Gastro-esophageal reflux disease without esophagitis: Secondary | ICD-10-CM | POA: Insufficient documentation

## 2015-07-28 ENCOUNTER — Encounter (HOSPITAL_COMMUNITY): Payer: Self-pay | Admitting: Psychiatry

## 2015-07-28 ENCOUNTER — Ambulatory Visit (INDEPENDENT_AMBULATORY_CARE_PROVIDER_SITE_OTHER): Payer: BLUE CROSS/BLUE SHIELD | Admitting: Psychiatry

## 2015-07-28 DIAGNOSIS — F331 Major depressive disorder, recurrent, moderate: Secondary | ICD-10-CM

## 2015-07-28 DIAGNOSIS — F411 Generalized anxiety disorder: Secondary | ICD-10-CM

## 2015-07-28 NOTE — Patient Instructions (Signed)
Discussed orally 

## 2015-07-28 NOTE — Progress Notes (Signed)
           THERAPIST PROGRESS NOTE  Session Time:     Wednesday 07/28/2015 2:15 PM -2:55 PM        Participation Level: Active     Behavioral Response: CasualAlertAnxious/ very talkative/depressed  Type of Therapy: Individual Therapy  Treatment Goals:     1. Learn and implement calming and coping strategies to reduce anxiety and emotional anger outbursts.       2. Identify, challenge, and replace negative fearful self-talk that contributes to excessive worry.     3. Identify, challenge , and replace self talk that evokes anger outbursts.   Treatment Goals addressed:  1  Interventions: CBT and Supportive  Summary: Heather Wang is a 55 y.o. female  who is referred for services by psychiatrist Dr. Tenny Crawoss to improve coping skills. Patient presents with a long standing history of symptoms of depression and anxiety since early childhood. She reports numerous hospitalizations from age 89 to age 89 due to a nonfunctioning right kidney. She eventually had surgery to have kidney remove. She also reports both mother and grandmother suffered from symptoms of anxiety and states learning some of her behaviors from them because they were always sick and worried. She says she has always had problems coping with daily life. She suffers from frequent panic attacks. She reports stress regarding her husband who was diagnosed with prostate and bladder cancer in 2008. Cancer has now spread to his bones. Patient experiences poor concentration and memory difficulty and states one of her daughters manages patient's and husband's medication as well as transport them to appointments  Patient last was seen in February 2017.  She reports feeling much better since last session. She denies any depression and says medication along with getting out more has helped. She reports trying to go some place almost every day rather than stay at home and sleep. She reports feeling as though she is coping well with the loss of her  husband. She expresses less worry about her granddaughter who is doing very well in school and the ROTC program. Patient is very focused on trying to help granddaughter have a successful future. She expresses some concern that granddaughter is hurt by patient's daughters actions. Patient fears how this will affect granddaughter. She continues to express anger, frustration, and disappointment with her daughters. She is contemplating moving as she states she does not want her daughters to know where she lives. Patient continues to experience anxiety when going places and says she does not go places like to the grocery store and  restaurants alone.      Suicidal/Homicidal: No  Therapist Response: Reviewed symptoms, administered depression screen and GAD-7, facilitated expression of feelings, praised and reinforced patient's increased involvement in activity, began to explore thought patterns and effects on patient's mood and behavior   Plan: Return again in 2 weeks.   Diagnosis: Axis I: MDD. GAD    Axis II: Deferred    Jeannemarie Sawaya, LCSW 07/28/2015

## 2015-08-20 ENCOUNTER — Ambulatory Visit (INDEPENDENT_AMBULATORY_CARE_PROVIDER_SITE_OTHER): Payer: BLUE CROSS/BLUE SHIELD | Admitting: Psychiatry

## 2015-08-20 ENCOUNTER — Encounter (HOSPITAL_COMMUNITY): Payer: Self-pay | Admitting: Psychiatry

## 2015-08-20 VITALS — BP 90/60 | Ht 64.0 in | Wt 101.0 lb

## 2015-08-20 DIAGNOSIS — F411 Generalized anxiety disorder: Secondary | ICD-10-CM

## 2015-08-20 DIAGNOSIS — F331 Major depressive disorder, recurrent, moderate: Secondary | ICD-10-CM | POA: Diagnosis not present

## 2015-08-20 MED ORDER — SERTRALINE HCL 100 MG PO TABS: 200.0000 mg | ORAL_TABLET | Freq: Every day | ORAL | Status: DC

## 2015-08-20 MED ORDER — OLANZAPINE 5 MG PO TABS: 5.0000 mg | ORAL_TABLET | Freq: Every day | ORAL | Status: DC

## 2015-08-20 MED ORDER — ALPRAZOLAM 1 MG PO TABS: 1.0000 mg | ORAL_TABLET | Freq: Four times a day (QID) | ORAL | Status: DC
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
## 2015-08-24 ENCOUNTER — Encounter (HOSPITAL_COMMUNITY): Payer: Self-pay | Admitting: Psychiatry

## 2015-08-24 ENCOUNTER — Ambulatory Visit (INDEPENDENT_AMBULATORY_CARE_PROVIDER_SITE_OTHER): Payer: BLUE CROSS/BLUE SHIELD | Admitting: Psychiatry

## 2015-08-24 DIAGNOSIS — F331 Major depressive disorder, recurrent, moderate: Secondary | ICD-10-CM | POA: Diagnosis not present

## 2015-08-24 DIAGNOSIS — F411 Generalized anxiety disorder: Secondary | ICD-10-CM

## 2015-08-24 NOTE — Progress Notes (Signed)
           THERAPIST PROGRESS NOTE  Session Time:     Tuesday 08/24/2015 1:03 PM -  1:54 PM         Participation Level: Active     Behavioral Response: CasualAlertAnxious/ very talkative/depressed  Type of Therapy: Individual Therapy  Treatment Goals:     1. Learn and implement calming and coping strategies to reduce anxiety and emotional anger outbursts.       2. Identify, challenge, and replace negative fearful self-talk that contributes to excessive worry.     3. Identify, challenge , and replace self talk that evokes anger outbursts.   Treatment Goals addressed:  1  Interventions: CBT and Supportive  Summary: Heather Wang is a 55 y.o. female  who is referred for services by psychiatrist Dr. Tenny Crawoss to improve coping skills. Patient presents with a long standing history of symptoms of depression and anxiety since early childhood. She reports numerous hospitalizations from age 89 to age 89 due to a nonfunctioning right kidney. She eventually had surgery to have kidney remove. She also reports both mother and grandmother suffered from symptoms of anxiety and states learning some of her behaviors from them because they were always sick and worried. She says she has always had problems coping with daily life. She suffers from frequent panic attacks. She reports stress regarding her husband who was diagnosed with prostate and bladder cancer in 2008. Cancer has now spread to his bones. Patient experiences poor concentration and memory difficulty and states one of her daughters manages patient's and husband's medication as well as transport them to appointments  Patient reports no symptoms of depression but continues to experience significant anxiety and anger. She expresses frustration regarding 2 of her grandchildren who are in the physical custody of their stepgrandmother. Per patient's report, stepmother is providing poor care and patient worries about the children's care. She also  expresses frustration she is allowed to visit the children only under DSS supervision or the discretion of the stepgrandmother. Patient fears grandchildren will be placed in the permanent custody of stepgrandmother when cases heard in court in July. She is contemplating expressing her concerns to the attorney prior to the hearing. Patient continues to have anger outbursts and cites a recent incident involving her appointment at the pain management clinic. She is less worried about the granddaughter who resides with her and says she is doing well.    Suicidal/Homicidal: No  Therapist Response: Reviewed symptoms, facilitated expression of feelings, discuss assertive behavior versus aggressive behavior, began to discuss the effects of anger and identify reasons to learn to manage anger in a more healthy way, began to discuss the effects of childhood trauma on emotional regulation  Plan: Return again in 2 weeks.   Diagnosis: Axis I: MDD. GAD    Axis II: Deferred    Michaela Broski, LCSW 08/24/2015

## 2015-08-24 NOTE — Patient Instructions (Signed)
Discussed orally 

## 2015-09-15 ENCOUNTER — Telehealth (HOSPITAL_COMMUNITY): Payer: Self-pay | Admitting: *Deleted

## 2015-09-15 ENCOUNTER — Ambulatory Visit (HOSPITAL_COMMUNITY): Payer: BLUE CROSS/BLUE SHIELD | Admitting: Psychiatry

## 2015-09-15 NOTE — Telephone Encounter (Signed)
In my opinion she has one more chance

## 2015-09-15 NOTE — Telephone Encounter (Signed)
lmtcb

## 2015-09-15 NOTE — Telephone Encounter (Signed)
Pt called this morning at 9:33am stating she just woke up and will not be able to come to her appt with Florencia ReasonsPeggy Bynum due to her sleeping too much lately. Informed pt office will have to call her back before office could resch her appt due to no show policy. Reminded pt again of no show policy and pt verbalized understanding. On 09-15-15, pt no showed for her appt with Florencia ReasonsPeggy Bynum. Per pt chart, pt also no showed on 07-05-15 with Florencia ReasonsPeggy Bynum, 06-10-15 with Florencia ReasonsPeggy Bynum, 05-03-15 with Dr. Tenny Crawoss, 01-19-15 with Florencia ReasonsPeggy Bynum and 11-24-14 with Dr. Tenny Crawoss. Please advise.

## 2015-09-15 NOTE — Telephone Encounter (Signed)
Phone call from patient, she is waiting for a return phone call from [place].

## 2015-09-16 NOTE — Telephone Encounter (Signed)
Spoke with pt

## 2015-09-16 NOTE — Telephone Encounter (Signed)
Spoke with pt and informed her of what Dr. Tenny Crawoss stated and pt showed understanding. Informed pt that staff is still waiting for Heather Wang to respond and when she does, office will call her back with response. Pt verbalized understanding.

## 2015-09-27 ENCOUNTER — Telehealth (HOSPITAL_COMMUNITY): Payer: Self-pay | Admitting: *Deleted

## 2015-09-27 NOTE — Telephone Encounter (Signed)
Pt called stating she was cleaning and may have thrown her last week of Xanax away. Per pt, she tried looking everywhere and can not find her script bottle. Pt stating she called her pharmacy to see if they could refill her Xanax early due to her recent situation and they informed her that they are unable to refill med early. Per pt, her pharmacy told her that her next fill date is 10-04-15 and for them to fill her medication 7 days early, Dr. Tenny Crawoss office would have to call them to approve it. Pt is aware insurance will not pay for those 7 days. Per pt, she have Biopsy on Wednesday at 1 pm and she can not go in there without taking her Xanax. Informed pt that provider is out of office and will not return to office until Wednesday 09-29-15 and pt verbalized understanding.

## 2015-09-29 NOTE — Telephone Encounter (Signed)
Called pt pharmacy and spoke with Junious Dresseronnie and informed her with what provider stated and she verbalized understanding.

## 2015-09-29 NOTE — Telephone Encounter (Signed)
She may fill it early just this one time

## 2015-09-29 NOTE — Telephone Encounter (Signed)
lmtcb and office number provided 

## 2015-10-18 ENCOUNTER — Ambulatory Visit (INDEPENDENT_AMBULATORY_CARE_PROVIDER_SITE_OTHER): Payer: Medicaid Other | Admitting: Psychiatry

## 2015-10-18 ENCOUNTER — Encounter (HOSPITAL_COMMUNITY): Payer: Self-pay | Admitting: Psychiatry

## 2015-10-18 VITALS — BP 93/52 | HR 81 | Ht 64.0 in | Wt 100.2 lb

## 2015-10-18 DIAGNOSIS — F331 Major depressive disorder, recurrent, moderate: Secondary | ICD-10-CM

## 2015-10-18 MED ORDER — SERTRALINE HCL 100 MG PO TABS: 200.0000 mg | ORAL_TABLET | Freq: Every day | ORAL | 2 refills | Status: DC

## 2015-10-18 MED ORDER — ALPRAZOLAM 1 MG PO TABS: 1.0000 mg | ORAL_TABLET | Freq: Four times a day (QID) | ORAL | 2 refills | Status: DC
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
## 2015-10-19 ENCOUNTER — Ambulatory Visit (INDEPENDENT_AMBULATORY_CARE_PROVIDER_SITE_OTHER): Payer: Medicaid Other | Admitting: Psychiatry

## 2015-10-19 ENCOUNTER — Encounter (HOSPITAL_COMMUNITY): Payer: Self-pay | Admitting: Psychiatry

## 2015-10-19 DIAGNOSIS — F411 Generalized anxiety disorder: Secondary | ICD-10-CM

## 2015-10-19 DIAGNOSIS — F331 Major depressive disorder, recurrent, moderate: Secondary | ICD-10-CM | POA: Diagnosis not present

## 2015-10-19 NOTE — Progress Notes (Signed)
           THERAPIST PROGRESS NOTE  Session Time:     Tuesday 10/19/2015 3:15 PM - 4:05 PM  Participation Level: Active     Behavioral Response: CasualAlertAnxious/ very talkative  Type of Therapy: Individual Therapy  Treatment Goals:     1. Learn and implement calming and coping strategies to reduce anxiety and emotional anger outbursts.       2. Identify, challenge, and replace negative fearful self-talk that contributes to excessive worry.     3. Identify, challenge , and replace self talk that evokes anger outbursts.   Treatment Goals addressed:  1  Interventions: CBT and Supportive  Summary: TRILLIUM GUNNISON is a 55 y.o. female  who is referred for services by psychiatrist Dr. Tenny Craw to improve coping skills. Patient presents with a long standing history of symptoms of depression and anxiety since early childhood. She reports numerous hospitalizations from age 70 to age 31 due to a nonfunctioning right kidney. She eventually had surgery to have kidney remove. She also reports both mother and grandmother suffered from symptoms of anxiety and states learning some of her behaviors from them because they were always sick and worried. She says she has always had problems coping with daily life. She suffers from frequent panic attacks. She reports stress regarding her husband who was diagnosed with prostate and bladder cancer in 2008. Cancer has now spread to his bones. Patient experiences poor concentration and memory difficulty and states one of her daughters manages patient's and husband's medication as well as transport them to appointments  Patient last was seen 2 months ago. She  reports no symptoms of depression and decreased anxiety but continues to experience significant anger. She expresses worry about finances but is hopeful about working out arrangements with her creditors and possibly obtaining medicaid. She has begun seeing 2 of her grandchildren who are in the physical custody of  their stepgrandmother. She reports enjoying this time with them but still is concerned about their future. Court case regarding custody has been continued until August 2017. She is pleased her granddaughter has gotten a job and expresses less worry about granddaughter. Patient reports continued anger especially about her trauma history.     Suicidal/Homicidal: No  Therapist Response: Reviewed symptoms, facilitated expression of feelings, discuss assertive behavior versus aggressive behavior, continued to discuss the effects of anger and identify reasons to learn to manage anger in a more healthy way, continued to discuss the effects of childhood trauma on emotional regulation, discuss the importance of regular attendance in treatment  Plan: Return again in 2 weeks.   Diagnosis: Axis I: MDD. GAD    Axis II: Deferred    Kathleen Tamm, LCSW 10/19/2015

## 2015-11-11 ENCOUNTER — Encounter (HOSPITAL_COMMUNITY): Payer: Self-pay | Admitting: Psychiatry

## 2015-11-11 ENCOUNTER — Ambulatory Visit (INDEPENDENT_AMBULATORY_CARE_PROVIDER_SITE_OTHER): Payer: Medicaid Other | Admitting: Psychiatry

## 2015-11-11 DIAGNOSIS — F331 Major depressive disorder, recurrent, moderate: Secondary | ICD-10-CM

## 2015-11-11 DIAGNOSIS — F411 Generalized anxiety disorder: Secondary | ICD-10-CM | POA: Diagnosis not present

## 2015-11-11 NOTE — Progress Notes (Signed)
           THERAPIST PROGRESS NOTE  Session Time:     Thursday 11/11/2015 11:25 AM - 11:55 AM  Participation Level: Active     Behavioral Response: CasualAlertAnxious/ very talkative  Type of Therapy: Individual Therapy  Treatment Goals:     1. Learn and implement calming and coping strategies to reduce anxiety and emotional anger outbursts.       2. Identify, challenge, and replace negative fearful self-talk that contributes to excessive worry.     3. Identify, challenge , and replace self talk that evokes anger outbursts.   Treatment Goals addressed:  1  Interventions: CBT and Supportive  Summary: Heather Wang is a 55 y.o. female  who is referred for services by psychiatrist Dr. Tenny Crawoss to improve coping skills. Patient presents with a long standing history of symptoms of depression and anxiety since early childhood. She reports numerous hospitalizations from age 89 to age 89 due to a nonfunctioning right kidney. She eventually had surgery to have kidney remove. She also reports both mother and grandmother suffered from symptoms of anxiety and states learning some of her behaviors from them because they were always sick and worried. She says she has always had problems coping with daily life. She suffers from frequent panic attacks. She reports stress regarding her husband who was diagnosed with prostate and bladder cancer in 2008. Cancer has now spread to his bones. Patient experiences poor concentration and memory difficulty and states one of her daughters manages patient's and husband's medication as well as transport them to appointments  Patient last was seen about 4 weeks ago. She reports increased anxiety, excessive worry, and poor motivation. She reports stress regarding efforts to have her vehicle placed in her name as he currently is in her deceased husband's name. She reports less worry about granddaughter who lives with her as granddaughter continues to do well. However,  patient continues to worry about her other grandchildren who remain in DSS is legal custody. Court case regarding to the grandchildren is scheduled for later this month and patient is concerned about their future. She also continues to express frustration and anger regarding her adult children's choices and behaviors. Patient admits constantly worrying and anticipating the worst. She does go out occasionally but reports staying in the house and not doing much of anything most days.    Suicidal/Homicidal: No  Therapist Response: Reviewed symptoms, facilitated expression of feelings, assisted patient identify triggers of anxiety, reviewed relaxation techniques, discussed thought stopping, assisted patient identify distracting activities   Plan: Return again in 2 weeks. Patient agrees to implement strategies discussed in session  Diagnosis: Axis I: MDD. GAD    Axis II: Deferred    Estus Krakowski, LCSW 11/11/2015

## 2015-11-25 ENCOUNTER — Ambulatory Visit (HOSPITAL_COMMUNITY): Payer: Self-pay | Admitting: Psychiatry

## 2015-12-09 ENCOUNTER — Encounter (HOSPITAL_COMMUNITY): Payer: Self-pay | Admitting: Psychiatry

## 2015-12-09 ENCOUNTER — Ambulatory Visit (INDEPENDENT_AMBULATORY_CARE_PROVIDER_SITE_OTHER): Payer: Medicaid Other | Admitting: Psychiatry

## 2015-12-09 DIAGNOSIS — F411 Generalized anxiety disorder: Secondary | ICD-10-CM | POA: Diagnosis not present

## 2015-12-09 DIAGNOSIS — F331 Major depressive disorder, recurrent, moderate: Secondary | ICD-10-CM | POA: Diagnosis not present

## 2015-12-09 NOTE — Progress Notes (Signed)
           THERAPIST PROGRESS NOTE  Session Time:     Thursday 12/09/2015 11:15 AM - 12:00 PM  Participation Level: Active     Behavioral Response: CasualAlertAnxious/very talkative/angry  Type of Therapy: Individual Therapy  Treatment Goals:     1. Learn and implement calming and coping strategies to reduce anxiety and emotional anger outbursts.       2. Identify, challenge, and replace negative fearful self-talk that contributes to excessive worry.     3. Identify, challenge , and replace self talk that evokes anger outbursts.   Treatment Goals addressed:  1.2.3  Interventions: CBT and Supportive  Summary: Heather Wang is a 55 y.o. female  who is referred for services by psychiatrist Dr. Tenny Crawoss to improve coping skills. Patient presents with a long standing history of symptoms of depression and anxiety since early childhood. She reports numerous hospitalizations from age 89 to age 89 due to a nonfunctioning right kidney. She eventually had surgery to have kidney remove. She also reports both mother and grandmother suffered from symptoms of anxiety and states learning some of her behaviors from them because they were always sick and worried. She says she has always had problems coping with daily life. She suffers from frequent panic attacks. She reports stress regarding her husband who was diagnosed with prostate and bladder cancer in 2008. Cancer has now spread to his bones. Patient experiences poor concentration and memory difficulty and states one of her daughters manages patient's and husband's medication as well as transport them to appointments  Patient last was seen about 2 weeks ago. She reports increased anger along with anxiety and  excessive worry. She reports at least 2 aggressive emotional outburst. She continues to have multiple stressors including financial concerns, issues with her adult children, issues with her grandchildren, and her health. She is particularly concerned  about the granddaughter who resides with her as she fears her involvement with her biological father may have a negative influence on granddaughter. Patient continues to experience excessive worry with a tendency to catastrophize.    Suicidal/Homicidal: No  Therapist Response: Reviewed symptoms, facilitated expression of feelings, discussed connection between thoughts/mood/and behavior, assisted patient  identify underlying feelings beneath anger, reviewed relaxation techniques  Plan: Return again in 2 weeks. Patient agrees to implement strategies discussed in session  Diagnosis: Axis I: MDD. GAD    Axis II: Deferred    BYNUM,PEGGY, LCSW 12/09/2015

## 2015-12-23 ENCOUNTER — Ambulatory Visit (HOSPITAL_COMMUNITY): Payer: Self-pay | Admitting: Psychiatry

## 2015-12-27 ENCOUNTER — Encounter (HOSPITAL_COMMUNITY): Payer: Self-pay | Admitting: Psychiatry

## 2015-12-27 ENCOUNTER — Ambulatory Visit (INDEPENDENT_AMBULATORY_CARE_PROVIDER_SITE_OTHER): Payer: Medicaid Other | Admitting: Psychiatry

## 2015-12-27 DIAGNOSIS — F411 Generalized anxiety disorder: Secondary | ICD-10-CM | POA: Diagnosis not present

## 2015-12-27 DIAGNOSIS — F331 Major depressive disorder, recurrent, moderate: Secondary | ICD-10-CM

## 2015-12-27 NOTE — Progress Notes (Signed)
   THERAPIST PROGRESS NOTE  Session Time:     Monday 12/27/2015 9:16 AM -  10:16 AM  Participation Level: Active     Behavioral Response: CasualAlertAnxious/very talkative/angry  Type of Therapy: Individual Therapy  Treatment Goals:     1. Learn and implement calming and coping strategies to reduce anxiety and emotional anger outbursts.       2. Identify, challenge, and replace negative fearful self-talk that contributes to excessive     3. Identify, challenge , and replace self talk that evokes anger outbursts.   Treatment Goals addressed:  1.2.3  Interventions: CBT and Supportive  Summary: Heather Wang is a 55 y.o. female  who is referred for services by psychiatrist Dr. Tenny Crawoss to improve coping skills. Patient presents with a long standing history of symptoms of depression and anxiety since early childhood. She reports numerous hospitalizations from age 89 to age 89 due to a nonfunctioning right kidney. She eventually had surgery to have kidney remove. She also reports both mother and grandmother suffered from symptoms of anxiety and states learning some of her behaviors from them because they were always sick and worried. She says she has always had problems coping with daily life. She suffers from frequent panic attacks. She reports stress regarding her husband who was diagnosed with prostate and bladder cancer in 2008. Cancer has now spread to his bones. Patient experiences poor concentration and memory difficulty and states one of her daughters manages patient's and husband's medication as well as transport them to appointments  Patient last was seen about 2 weeks ago. She reports continued symptoms of anxiety and depression including depressed mood, decreased interest in activities, irritability, poor motivation, excessive worry, muscle tension, and poor concentration since last session. She also reports continued anger and anger outbursts. Patient also reports a dissociative experience  about 2 weeks ago saying she did not feel real and felt like she was outside of her body. She reports continued stress related to her children and grandchildren. She expresses continued anger and disappointment regarding her daughters' choices and behaviors. She continues to have concerns about her grandchildren in foster care but expresses increased acceptance of their current placements. Patient continues to experience financial stress and increased physical pain related to back issues. She  reports feeling overwhelmed. She says she has been having more thoughts and anger about deceased husband regarding decisions he made and the effects on her life now. Patient reports xanax seems to be helpful but other medication doesn't seem to be helping.    Suicidal/Homicidal: No  Therapist Response: Reviewed symptoms, administered Depression Screen PH-Q 9 and GAD-7, identified stressors, assisted patient identify sources of anger, facilitated expression of feelings, discussed grief and loss issues and  discussed anger as a part of the grieving process, reviewed and revised treatment plan, discussed rationale for and practiced controlled breathing, assisted patient identify and replace negative thoughts regarding efforts to practice controlled breathing  Plan: Return again in 2 weeks. Patient agrees to practice controlled breathing 5 minutes 2 times per day.  Diagnosis: Axis I: MDD. GAD    Axis II: Deferred    Sinahi Knights, LCSW 12/27/2015

## 2015-12-31 ENCOUNTER — Other Ambulatory Visit: Payer: Self-pay | Admitting: Neurosurgery

## 2015-12-31 DIAGNOSIS — M5442 Lumbago with sciatica, left side: Principal | ICD-10-CM

## 2015-12-31 DIAGNOSIS — G8929 Other chronic pain: Secondary | ICD-10-CM

## 2016-01-12 ENCOUNTER — Ambulatory Visit (INDEPENDENT_AMBULATORY_CARE_PROVIDER_SITE_OTHER): Payer: Medicaid Other | Admitting: Psychiatry

## 2016-01-12 ENCOUNTER — Encounter (HOSPITAL_COMMUNITY): Payer: Self-pay | Admitting: Psychiatry

## 2016-01-12 DIAGNOSIS — F331 Major depressive disorder, recurrent, moderate: Secondary | ICD-10-CM

## 2016-01-12 DIAGNOSIS — F411 Generalized anxiety disorder: Secondary | ICD-10-CM

## 2016-01-12 NOTE — Progress Notes (Signed)
   THERAPIST PROGRESS NOTE  Session Time:     Wednesday 01/12/2016 2:15 PM  - 3:10 PM                     Participation Level: Active     Behavioral Response: CasualAlertAnxious/very talkative/angry  Type of Therapy: Individual Therapy   Treatment Goals:     1. Learn and implement calming and coping strategies to reduce anxiety and emotional anger outbursts.       2. Identify, challenge, and replace negative fearful self-talk that contributes to depression and anxiety     3. Process and resolve grief and loss issues related to husband's death.  Treatment Goals addressed:  1.2.3  Interventions: CBT and Supportive  Summary: Heather Wang is a 55 y.o. female  who is referred for services by psychiatrist Dr. Tenny Crawoss to improve coping skills. Patient presents with a long standing history of symptoms of depression and anxiety since early childhood. She reports numerous hospitalizations from age 89 to age 89 due to a nonfunctioning right kidney. She eventually had surgery to have kidney remove. She also reports both mother and grandmother suffered from symptoms of anxiety and states learning some of her behaviors from them because they were always sick and worried. She says she has always had problems coping with daily life. She suffers from frequent panic attacks. She reports stress regarding her husband who was diagnosed with prostate and bladder cancer in 2008. Cancer has now spread to his bones. Patient experiences poor concentration and memory difficulty and states one of her daughters manages patient's and husband's medication as well as transport them to appointments  Patient last was seen about 2 weeks ago. She reports continued symptoms of anxiety and depression including depressed mood, decreased interest in activities, irritability, poor motivation, excessive worry, muscle tension, and poor concentration since last session. She denies having any more dissociative experiences. She continues to  experience anger but doesn't report any anger outbursts since last session. She reports enjoying recent school field trip with her granddaughter. She continues to have concerns about her grandchildren in foster care and now expresses increased frustration and anger regarding their current placement. Court case regarding custody is scheduled for 01/24/2016. Patient continues to experience financial stress and  physical pain related to back issues. She reports she hasn't been practicing controlled breathing and expresses little hope that she will be able to manage anger and anxiety. She reports continued  thoughts and anger about deceased husband.   Suicidal/Homicidal: No  Therapist Response: Reviewed symptoms, identified stressors,used nondirective techniques to allow patient to express and process angry feelings regarding deceased husband, discussed the effects of anger and anxiety, assisted patient identify thoughts and behaviors that inhibit patient completing homework to practice relaxation technique, began to explore other ways to reduce stress    Plan: Return again in 2 weeks. Patient agrees to practice controlled breathing 5 minutes 2 times per day.  Diagnosis: Axis I: MDD. GAD    Axis II: Deferred    BYNUM,PEGGY, LCSW 01/12/2016

## 2016-01-15 ENCOUNTER — Ambulatory Visit
Admission: RE | Admit: 2016-01-15 | Discharge: 2016-01-15 | Disposition: A | Discharge status: Home / Self Care | Payer: Medicaid Other | Source: Ambulatory Visit | Attending: Neurosurgery | Admitting: Neurosurgery

## 2016-01-15 DIAGNOSIS — M5442 Lumbago with sciatica, left side: Principal | ICD-10-CM

## 2016-01-15 DIAGNOSIS — G8929 Other chronic pain: Secondary | ICD-10-CM

## 2016-01-18 ENCOUNTER — Ambulatory Visit (INDEPENDENT_AMBULATORY_CARE_PROVIDER_SITE_OTHER): Payer: Medicaid Other | Admitting: Psychiatry

## 2016-01-18 ENCOUNTER — Encounter (HOSPITAL_COMMUNITY): Payer: Self-pay | Admitting: Psychiatry

## 2016-01-18 VITALS — BP 116/66 | HR 74 | Ht 64.0 in | Wt 103.8 lb

## 2016-01-18 DIAGNOSIS — Z818 Family history of other mental and behavioral disorders: Secondary | ICD-10-CM

## 2016-01-18 DIAGNOSIS — Z8249 Family history of ischemic heart disease and other diseases of the circulatory system: Secondary | ICD-10-CM

## 2016-01-18 DIAGNOSIS — F331 Major depressive disorder, recurrent, moderate: Secondary | ICD-10-CM

## 2016-01-18 DIAGNOSIS — Z807 Family history of other malignant neoplasms of lymphoid, hematopoietic and related tissues: Secondary | ICD-10-CM

## 2016-01-18 DIAGNOSIS — Z8261 Family history of arthritis: Secondary | ICD-10-CM

## 2016-01-18 DIAGNOSIS — F411 Generalized anxiety disorder: Secondary | ICD-10-CM

## 2016-01-18 MED ORDER — ALPRAZOLAM 1 MG PO TABS: 1.0000 mg | ORAL_TABLET | Freq: Four times a day (QID) | ORAL | 2 refills | Status: DC

## 2016-01-18 MED ORDER — SERTRALINE HCL 100 MG PO TABS: 200.0000 mg | ORAL_TABLET | Freq: Every day | ORAL | 2 refills | Status: DC
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
## 2016-01-27 ENCOUNTER — Ambulatory Visit (INDEPENDENT_AMBULATORY_CARE_PROVIDER_SITE_OTHER): Payer: Medicaid Other | Admitting: Psychiatry

## 2016-01-27 ENCOUNTER — Encounter (HOSPITAL_COMMUNITY): Payer: Self-pay | Admitting: Psychiatry

## 2016-01-27 DIAGNOSIS — F331 Major depressive disorder, recurrent, moderate: Secondary | ICD-10-CM

## 2016-01-27 DIAGNOSIS — F411 Generalized anxiety disorder: Secondary | ICD-10-CM | POA: Diagnosis not present

## 2016-01-27 NOTE — Progress Notes (Signed)
   THERAPIST PROGRESS NOTE  Session Time:     Thursday 01/27/2016 11:05 AM - 11:50 AM  Participation Level: Active     Behavioral Response: CasualAlertAnxious/very talkative/angry  Type of Therapy: Individual Therapy   Treatment Goals:     1. Learn and implement calming and coping strategies to reduce anxiety and emotional anger outbursts.             2. Identify, challenge, and replace negative fearful self-talk that contributes to depression and anxiety     3. Process and resolve grief and loss issues related to husband's death.  Treatment Goals addressed:  1.2.  Interventions: CBT and Supportive  Summary: Heather Wang is a 55 y.o. female  who is referred for services by psychiatrist Dr. Tenny Crawoss to improve coping skills. Patient presents with a long standing history of symptoms of depression and anxiety since early childhood. She reports numerous hospitalizations from age 89 to age 89 due to a nonfunctioning right kidney. She eventually had surgery to have kidney remove. She also reports both mother and grandmother suffered from symptoms of anxiety and states learning some of her behaviors from them because they were always sick and worried. She says she has always had problems coping with daily life. She suffers from frequent panic attacks. She reports stress regarding her husband who was diagnosed with prostate and bladder cancer in 2008. Cancer has now spread to his bones. Patient experiences poor concentration and memory difficulty and states one of her daughters manages patient's and husband's medication as well as transport them to appointments  Patient last was seen about 2 weeks ago. She reports continued symptoms of anxiety including panic attacks, excessive worry, muscle tension, and poor concentration since last session. She reports feeling more anxious than depressed. She reports continued back pain and recently seeing neurosurgeon who has recommended injections. Patient is waiting for  an appointment. She constantly worries about her health among a variety of other issues. She worries about having a panic attack and continues to have negative spiraling thoughts.She continues to experience anger but doesn't report any anger outbursts since last session but continues to experience significant anger.  She reports continued stress related to grandchildren. Court case has been continued again. She reports she tried using controlled breathing once since last session.    Suicidal/Homicidal: No  Therapist Response: Reviewed symptoms, identified stressors, facilitated expression of feelings, discussed the chemistry of anxiety and effects on the body, discussed the rationale for practicing relaxation techniques daily, introduced patient to and assisted patient to begin to practice progressive muscle relaxation   Plan: Return again in 2 weeks.   Diagnosis: Axis I: MDD. GAD    Axis II: Deferred    Heather Beever, LCSW 01/27/2016

## 2016-02-09 ENCOUNTER — Encounter (HOSPITAL_COMMUNITY): Payer: Self-pay | Admitting: Psychiatry

## 2016-02-09 ENCOUNTER — Ambulatory Visit (INDEPENDENT_AMBULATORY_CARE_PROVIDER_SITE_OTHER): Payer: Medicaid Other | Admitting: Psychiatry

## 2016-02-09 DIAGNOSIS — F411 Generalized anxiety disorder: Secondary | ICD-10-CM

## 2016-02-09 DIAGNOSIS — F331 Major depressive disorder, recurrent, moderate: Secondary | ICD-10-CM

## 2016-02-09 NOTE — Progress Notes (Signed)
   THERAPIST PROGRESS NOTE  Session Time:     Wednesday 02/09/2016 11:15 AM  -12:15 PM              Participation Level: Active     Behavioral Response: CasualAlertAnxious/very talkative/angry  Type of Therapy: Individual Therapy   Treatment Goals:     1. Learn and implement calming and coping strategies to reduce anxiety and emotional anger outbursts.             2. Identify, challenge, and replace negative fearful self-talk that contributes to depression and anxiety     3. Process and resolve grief and loss issues related to husband's death.  Treatment Goals addressed:  1.2.  Interventions: CBT and Supportive  Summary: Heather Wang is a 55 y.o. female  who is referred for services by psychiatrist Dr. Tenny Crawoss to improve coping skills. Patient presents with a long standing history of symptoms of depression and anxiety since early childhood. She reports numerous hospitalizations from age 89 to age 89 due to a nonfunctioning right kidney. She eventually had surgery to have kidney remove. She also reports both mother and grandmother suffered from symptoms of anxiety and states learning some of her behaviors from them because they were always sick and worried. She says she has always had problems coping with daily life. She suffers from frequent panic attacks. She reports stress regarding her husband who was diagnosed with prostate and bladder cancer in 2008. Cancer has now spread to his bones. Patient experiences poor concentration and memory difficulty and states one of her daughters manages patient's and husband's medication as well as transport them to appointments  Patient last was seen about 2 weeks ago. She reports increased symptoms of depression including depressed mood, excessive sleeping, and decreased interest in activities along with continued symptoms of anxiety including panic attacks, excessive worry, muscle tension, and poor concentration since last session. She reports stress related  to relationship with her 54 year old granddaughter who resides with her as she has been argumentative, defiant, angry, and disrespectful to patient.  She admits this has been going on for several months. Patient also has had increased thoughts about deceased husband and still is experiencing adjustment issues to being a widow. She also reports continued financial stress. She continues to worry about the future of two of her other grandchildren as custody case is scheduled for Monday 02/14/2016 and patient plans to testify in case.    Suicidal/Homicidal: No  Therapist Response: Reviewed symptoms, identified stressors, used nondirective techniques to allow patient to express and process feelings of  anger, fear, hurt, and disappointment regarding relationship with granddaughter and the loss of her husband, assisted patient identify ways to use her support system and develop coping statements  Plan: Return again in 2 weeks.   Diagnosis: Axis I: MDD. GAD    Axis II: Deferred    BYNUM,PEGGY, LCSW 02/09/2016

## 2016-03-14 ENCOUNTER — Encounter (HOSPITAL_COMMUNITY): Payer: Self-pay | Admitting: Psychiatry

## 2016-03-14 ENCOUNTER — Ambulatory Visit (INDEPENDENT_AMBULATORY_CARE_PROVIDER_SITE_OTHER): Payer: Medicaid Other | Admitting: Psychiatry

## 2016-03-14 DIAGNOSIS — F411 Generalized anxiety disorder: Secondary | ICD-10-CM

## 2016-03-14 DIAGNOSIS — F331 Major depressive disorder, recurrent, moderate: Secondary | ICD-10-CM | POA: Diagnosis not present

## 2016-03-14 NOTE — Progress Notes (Signed)
   THERAPIST PROGRESS NOTE  Session Time:     Tuesday 03/14/2016 11:05 AM -11:59 AM                   Participation Level: Active     Behavioral Response: CasualAlertAnxious/very talkative/improved mood  Type of Therapy: Individual Therapy   Treatment Goals:     1. Learn and implement calming and coping strategies to reduce anxiety and emotional anger outbursts.             2. Identify, challenge, and replace negative fearful self-talk that contributes to depression and anxiety     3. Process and resolve grief and loss issues related to husband's death.  Treatment Goals addressed:  1.2.  Interventions: CBT and Supportive  Summary: Heather Wang is a 55 y.o. female  who is referred for services by psychiatrist Dr. Tenny Crawoss to improve coping skills. Patient presents with a long standing history of symptoms of depression and anxiety since early childhood. She reports numerous hospitalizations from age 89 to age 89 due to a nonfunctioning right kidney. She eventually had surgery to have kidney remove. She also reports both mother and grandmother suffered from symptoms of anxiety and states learning some of her behaviors from them because they were always sick and worried. She says she has always had problems coping with daily life. She suffers from frequent panic attacks. She reports stress regarding her husband who was diagnosed with prostate and bladder cancer in 2008. Cancer has now spread to his bones. Patient experiences poor concentration and memory difficulty and states one of her daughters manages patient's and husband's medication as well as transport them to appointments  Patient last was seen about 2 3 weeks ago. She reports improved mood since last session. She reports no current symptoms of depression. She continues to experience anxiety, excessive worry, and memory difficulty.She expresses worry about upcoming custody case regarding her grandchildren on 03/16/2016. She plans to testify in  case. She is pleased her daughter is making increased efforts to try to work with DSS and court regarding children. She continues to express worry about grandchildren. She reports improved interaction with granddaughter who resides with her and reports successfully setting/ maintaining boundaries with granddaughter and  using assertive communication with granddaughter. Patient continues to reports stress and worry related to her health and finances.    Suicidal/Homicidal: No  Therapist Response: Reviewed symptoms, identified stressors, praised and reinforced use of assertiveness skills/setting and maintaining boundaries, facilitated expression of feelings and thoughts about future of grandchildren and being in court, explored calming strategies to reduce anxiety regarding court   Plan: Return again in 2 weeks.   Diagnosis: Axis I: MDD. GAD    Axis II: Deferred    Aleathea Pugmire, LCSW 03/14/2016

## 2016-03-31 ENCOUNTER — Telehealth (HOSPITAL_COMMUNITY): Payer: Self-pay | Admitting: *Deleted

## 2016-03-31 NOTE — Telephone Encounter (Signed)
RETURNED PHONE CALL, NO ANSWER, LEFT VOICE MESSAGE. 

## 2016-04-06 ENCOUNTER — Ambulatory Visit (INDEPENDENT_AMBULATORY_CARE_PROVIDER_SITE_OTHER): Payer: Medicaid Other | Admitting: Psychiatry

## 2016-04-06 ENCOUNTER — Encounter (HOSPITAL_COMMUNITY): Payer: Self-pay | Admitting: Psychiatry

## 2016-04-06 DIAGNOSIS — F331 Major depressive disorder, recurrent, moderate: Secondary | ICD-10-CM

## 2016-04-06 DIAGNOSIS — F411 Generalized anxiety disorder: Secondary | ICD-10-CM | POA: Diagnosis not present
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
## 2016-04-19 ENCOUNTER — Ambulatory Visit (INDEPENDENT_AMBULATORY_CARE_PROVIDER_SITE_OTHER): Payer: Medicaid Other | Admitting: Psychiatry

## 2016-04-19 ENCOUNTER — Encounter (HOSPITAL_COMMUNITY): Payer: Self-pay | Admitting: Psychiatry

## 2016-04-19 VITALS — BP 121/62 | HR 78 | Ht 64.0 in | Wt 106.0 lb

## 2016-04-19 DIAGNOSIS — Z882 Allergy status to sulfonamides status: Secondary | ICD-10-CM

## 2016-04-19 DIAGNOSIS — F331 Major depressive disorder, recurrent, moderate: Secondary | ICD-10-CM

## 2016-04-19 DIAGNOSIS — F411 Generalized anxiety disorder: Secondary | ICD-10-CM

## 2016-04-19 DIAGNOSIS — Z79899 Other long term (current) drug therapy: Secondary | ICD-10-CM

## 2016-04-19 DIAGNOSIS — Z888 Allergy status to other drugs, medicaments and biological substances status: Secondary | ICD-10-CM | POA: Diagnosis not present

## 2016-04-19 DIAGNOSIS — Z818 Family history of other mental and behavioral disorders: Secondary | ICD-10-CM

## 2016-04-19 DIAGNOSIS — Z808 Family history of malignant neoplasm of other organs or systems: Secondary | ICD-10-CM | POA: Diagnosis not present

## 2016-04-19 DIAGNOSIS — Z8249 Family history of ischemic heart disease and other diseases of the circulatory system: Secondary | ICD-10-CM

## 2016-04-19 DIAGNOSIS — Z8261 Family history of arthritis: Secondary | ICD-10-CM

## 2016-04-19 MED ORDER — ALPRAZOLAM 1 MG PO TABS: 1.0000 mg | ORAL_TABLET | Freq: Four times a day (QID) | ORAL | 2 refills | Status: DC

## 2016-04-19 MED ORDER — SERTRALINE HCL 100 MG PO TABS: 200.0000 mg | ORAL_TABLET | Freq: Every day | ORAL | 2 refills | Status: DC
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
## 2016-04-28 ENCOUNTER — Ambulatory Visit (INDEPENDENT_AMBULATORY_CARE_PROVIDER_SITE_OTHER): Payer: Medicaid Other | Admitting: Psychiatry

## 2016-04-28 DIAGNOSIS — F411 Generalized anxiety disorder: Secondary | ICD-10-CM

## 2016-04-28 DIAGNOSIS — F331 Major depressive disorder, recurrent, moderate: Secondary | ICD-10-CM

## 2016-04-28 NOTE — Progress Notes (Signed)
   THERAPIST PROGRESS NOTE  Session Time:      Friday 04/28/2016 10:17 AM  - 11:10 AM   Thursday 04/06/2016 9:59 AM - 10:58 AM                Participation Level: Active     Behavioral Response: CasualAlertAnxious/very talkative/improved mood  Type of Therapy: Individual Therapy   Treatment Goals:     1. Learn and implement calming and coping strategies to reduce anxiety and emotional anger outbursts.             2. Identify, challenge, and replace negative fearful self-talk that contributes to depression and anxiety     3. Process and resolve grief and loss issues related to husband's death.  Treatment Goals addressed:  1,2  Interventions: CBT and Supportive  Summary: Heather Wang is a 56 y.o. female  who is referred for services by psychiatrist Dr. Tenny Crawoss to improve coping skills. Patient presents with a long standing history of symptoms of depression and anxiety since early childhood. She reports numerous hospitalizations from age 89 to age 89 due to a nonfunctioning right kidney. She eventually had surgery to have kidney remove. She also reports both mother and grandmother suffered from symptoms of anxiety and states learning some of her behaviors from them because they were always sick and worried. She says she has always had problems coping with daily life. She suffers from frequent panic attacks. She reports stress regarding her husband who was diagnosed with prostate and bladder cancer in 2008. Cancer has now spread to his bones. Patient experiences poor concentration and memory difficulty and states one of her daughters manages patient's and husband's medication as well as transport them to appointments  Patient last was seen about 2- 3 weeks ago. She reports less depressed mood and decreased stress regarding financial issues since last session. She has increased activity and has been doing more things around the house. She has been able to work out a Teacher, adult educationfinancial agreement regarding her car.  She also reports a family friend has given her and her family food and supplies. Her daughter who is residing with her continues to work and has been helping out financially. She reports daughter's boyfriend who was just released from incarceration has moved in. She reports situation has been positive so far but she worries he may relapse as he has had substance abuse issues. She reports being more involved and enjoying her family being around. She reports continued worry about her grandchildren who remain in foster care as parental rights were terminated at last court hearing. However, her daughter and boyfriend plan to appeal case. Patient reports constant worry about a variety of issues including her health and  upcoming trip with granddaughter.     Suicidal/Homicidal: No  Therapist Response: Reviewed symptoms, identified stressors, facilitated expression of feelings, discussed setting a designated worry time, and assisted patient identify thought stopping strategies  Plan: Return again in 2 -3 weeks.   Diagnosis: Axis I: MDD. GAD    Axis II: Deferred    Uriel Dowding, LCSW 04/28/2016

## 2016-05-15 ENCOUNTER — Ambulatory Visit (INDEPENDENT_AMBULATORY_CARE_PROVIDER_SITE_OTHER): Payer: Medicaid Other | Admitting: Psychiatry

## 2016-05-15 DIAGNOSIS — F411 Generalized anxiety disorder: Secondary | ICD-10-CM

## 2016-05-15 DIAGNOSIS — F331 Major depressive disorder, recurrent, moderate: Secondary | ICD-10-CM
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
## 2016-05-18 ENCOUNTER — Ambulatory Visit (HOSPITAL_COMMUNITY)
Admission: RE | Admit: 2016-05-18 | Discharge: 2016-05-18 | Disposition: A | Discharge status: Home / Self Care | Payer: Medicaid Other | Source: Ambulatory Visit | Attending: Adult Health Nurse Practitioner | Admitting: Adult Health Nurse Practitioner

## 2016-05-18 ENCOUNTER — Other Ambulatory Visit (HOSPITAL_COMMUNITY): Payer: Self-pay | Admitting: Adult Health Nurse Practitioner

## 2016-05-18 DIAGNOSIS — R05 Cough: Secondary | ICD-10-CM

## 2016-05-18 DIAGNOSIS — R059 Cough, unspecified: Secondary | ICD-10-CM

## 2016-05-18 DIAGNOSIS — R918 Other nonspecific abnormal finding of lung field: Secondary | ICD-10-CM | POA: Diagnosis not present

## 2016-05-29 ENCOUNTER — Encounter (HOSPITAL_COMMUNITY): Payer: Self-pay | Admitting: Psychiatry

## 2016-05-29 ENCOUNTER — Ambulatory Visit (INDEPENDENT_AMBULATORY_CARE_PROVIDER_SITE_OTHER): Payer: Medicaid Other | Admitting: Psychiatry

## 2016-05-29 DIAGNOSIS — F331 Major depressive disorder, recurrent, moderate: Secondary | ICD-10-CM | POA: Diagnosis not present

## 2016-05-29 DIAGNOSIS — F411 Generalized anxiety disorder: Secondary | ICD-10-CM

## 2016-05-29 NOTE — Progress Notes (Signed)
              THERAPIST PROGRESS NOTE  Session Time:     Monday 05/29/2016 11:10 AM - 12:02 PM       Participation Level: Active     Behavioral Response: CasualAlertAnxious/very talkative/improved mood  Type of Therapy: Individual Therapy   Treatment Goals:     1. Learn and implement calming and coping strategies to reduce anxiety and emotional anger outbursts.             2. Identify, challenge, and replace negative fearful self-talk that contributes to depression and anxiety     3. Process and resolve grief and loss issues related to husband's death.  Treatment Goals addressed:  1  Interventions: CBT and Supportive  Summary: Heather Wang is a 56 y.o. female  who is referred for services by psychiatrist Dr. Tenny Crawoss to improve coping skills. Patient presents with a long standing history of symptoms of depression and anxiety since early childhood. She reports numerous hospitalizations from age 89 to age 89 due to a nonfunctioning right kidney. She eventually had surgery to have kidney remove. She also reports both mother and grandmother suffered from symptoms of anxiety and states learning some of her behaviors from them because they were always sick and worried. She says she has always had problems coping with daily life. She suffers from frequent panic attacks. She reports stress regarding her husband who was diagnosed with prostate and bladder cancer in 2008. Cancer has now spread to his bones. Patient experiences poor concentration and memory difficulty and states one of her daughters manages patient's and husband's medication as well as transport them to appointments  Patient last was seen about 2- 3 weeks ago. She reports continued stress, anxiety, sleep difficulty, loss of appetite, and worry since last session. She also reports increased depressed mood, diminished interest in activities, and hypersomnia. She reports often just falling asleep during the day to keep from thinking about her  situation. She reports recently being diagnosed with pneumonia and is taking antibiotics.  She reports granddaughter is experiencing increased behavioral issues. She is trying to pursue treatment for granddaughter. She continues to worry about possibility of losing her home but has begun to try to think of alternatives should this happen. She is planning to go on school trip with granddaughter in 1 1/2 weeks.    Suicidal/Homicidal: No  Therapist Response: Reviewed symptoms, identified stressors, facilitated expression of thoughts and feelings, discussed ways to increase behavioral activation including doing light household tasks/chores, also discussed journaling, exlpored possible resources for granddaughter.   Plan: Return again in 2 -3 weeks.   Diagnosis: Axis I: MDD. GAD    Axis II: Deferred    [name], LCSW 05/29/2016

## 2016-06-03 ENCOUNTER — Emergency Department (HOSPITAL_COMMUNITY): Payer: Medicaid Other

## 2016-06-03 ENCOUNTER — Inpatient Hospital Stay (HOSPITAL_COMMUNITY)
Admission: EM | Admit: 2016-06-03 | Discharge: 2016-06-06 | DRG: 391 | Disposition: A | Discharge status: Home / Self Care | Payer: Medicaid Other | Attending: Internal Medicine | Admitting: Internal Medicine

## 2016-06-03 ENCOUNTER — Encounter (HOSPITAL_COMMUNITY): Payer: Self-pay | Admitting: Emergency Medicine

## 2016-06-03 DIAGNOSIS — R112 Nausea with vomiting, unspecified: Secondary | ICD-10-CM

## 2016-06-03 DIAGNOSIS — J189 Pneumonia, unspecified organism: Secondary | ICD-10-CM

## 2016-06-03 DIAGNOSIS — E876 Hypokalemia: Secondary | ICD-10-CM | POA: Diagnosis present

## 2016-06-03 DIAGNOSIS — F329 Major depressive disorder, single episode, unspecified: Secondary | ICD-10-CM | POA: Diagnosis present

## 2016-06-03 DIAGNOSIS — K529 Noninfective gastroenteritis and colitis, unspecified: Secondary | ICD-10-CM

## 2016-06-03 DIAGNOSIS — J440 Chronic obstructive pulmonary disease with acute lower respiratory infection: Secondary | ICD-10-CM | POA: Diagnosis present

## 2016-06-03 DIAGNOSIS — Z882 Allergy status to sulfonamides status: Secondary | ICD-10-CM

## 2016-06-03 DIAGNOSIS — D696 Thrombocytopenia, unspecified: Secondary | ICD-10-CM | POA: Diagnosis present

## 2016-06-03 DIAGNOSIS — I9589 Other hypotension: Secondary | ICD-10-CM | POA: Diagnosis present

## 2016-06-03 DIAGNOSIS — Z79899 Other long term (current) drug therapy: Secondary | ICD-10-CM

## 2016-06-03 DIAGNOSIS — Z7951 Long term (current) use of inhaled steroids: Secondary | ICD-10-CM

## 2016-06-03 DIAGNOSIS — I959 Hypotension, unspecified: Secondary | ICD-10-CM

## 2016-06-03 DIAGNOSIS — Z886 Allergy status to analgesic agent status: Secondary | ICD-10-CM

## 2016-06-03 DIAGNOSIS — M810 Age-related osteoporosis without current pathological fracture: Secondary | ICD-10-CM | POA: Diagnosis present

## 2016-06-03 DIAGNOSIS — R197 Diarrhea, unspecified: Secondary | ICD-10-CM

## 2016-06-03 DIAGNOSIS — F17210 Nicotine dependence, cigarettes, uncomplicated: Secondary | ICD-10-CM | POA: Diagnosis present

## 2016-06-03 DIAGNOSIS — A09 Infectious gastroenteritis and colitis, unspecified: Principal | ICD-10-CM | POA: Diagnosis present

## 2016-06-03 HISTORY — DX: Dorsalgia, unspecified: M54.9

## 2016-06-03 HISTORY — DX: Other chronic pain: G89.29

## 2016-06-03 LAB — COMPREHENSIVE METABOLIC PANEL
ALBUMIN: 3.2 g/dL — AB (ref 3.5–5.0)
ALT: 11 U/L — ABNORMAL LOW (ref 14–54)
AST: 27 U/L (ref 15–41)
Alkaline Phosphatase: 72 U/L (ref 38–126)
Anion gap: 9 (ref 5–15)
BILIRUBIN TOTAL: 0.3 mg/dL (ref 0.3–1.2)
BUN: 9 mg/dL (ref 6–20)
CHLORIDE: 105 mmol/L (ref 101–111)
CO2: 22 mmol/L (ref 22–32)
Calcium: 8.2 mg/dL — ABNORMAL LOW (ref 8.9–10.3)
Creatinine, Ser: 0.73 mg/dL (ref 0.44–1.00)
GFR calc Af Amer: >60 mL/min (ref >60)
GFR calc non Af Amer: >60 mL/min (ref >60)
GLUCOSE: 115 mg/dL — AB (ref 65–99)
POTASSIUM: 3.4 mmol/L — AB (ref 3.5–5.1)
Sodium: 136 mmol/L (ref 135–145)
Total Protein: 5.8 g/dL — ABNORMAL LOW (ref 6.5–8.1)

## 2016-06-03 LAB — URINALYSIS, ROUTINE W REFLEX MICROSCOPIC
Bilirubin Urine: NEGATIVE
GLUCOSE, UA: NEGATIVE mg/dL
HGB URINE DIPSTICK: NEGATIVE
Ketones, ur: 20 mg/dL — AB
Leukocytes, UA: NEGATIVE
Nitrite: NEGATIVE
Protein, ur: NEGATIVE mg/dL
SPECIFIC GRAVITY, URINE: 1.035 — AB (ref 1.005–1.030)
pH: 5 (ref 5.0–8.0)

## 2016-06-03 LAB — CBC WITH DIFFERENTIAL/PLATELET
Basophils Absolute: 0 10*3/uL (ref 0.0–0.1)
Basophils Relative: 0 %
Eosinophils Absolute: 0 10*3/uL (ref 0.0–0.7)
Eosinophils Relative: 0 %
HCT: 36.9 % (ref 36.0–46.0)
HEMOGLOBIN: 12.2 g/dL (ref 12.0–15.0)
LYMPHS ABS: 0.9 10*3/uL (ref 0.7–4.0)
LYMPHS PCT: 28 %
MCH: 30 pg (ref 26.0–34.0)
MCHC: 33.1 g/dL (ref 30.0–36.0)
MCV: 90.9 fL (ref 78.0–100.0)
Monocytes Absolute: 0.3 10*3/uL (ref 0.1–1.0)
Monocytes Relative: 8 %
NEUTROS ABS: 2.1 10*3/uL (ref 1.7–7.7)
NEUTROS PCT: 64 %
Platelets: 137 10*3/uL — ABNORMAL LOW (ref 150–400)
RBC: 4.06 MIL/uL (ref 3.87–5.11)
RDW: 14.4 % (ref 11.5–15.5)
WBC: 3.4 10*3/uL — AB (ref 4.0–10.5)

## 2016-06-03 LAB — POC OCCULT BLOOD, ED: FECAL OCCULT BLD: POSITIVE — AB

## 2016-06-03 LAB — TROPONIN I: Troponin I: <0.03 ng/mL (ref <0.03)

## 2016-06-03 LAB — LIPASE, BLOOD: Lipase: 16 U/L (ref 11–51)

## 2016-06-03 LAB — LACTIC ACID, PLASMA: Lactic Acid, Venous: 1.5 mmol/L (ref 0.5–1.9)

## 2016-06-03 MED ORDER — SODIUM CHLORIDE 0.9 % IV BOLUS (SEPSIS)
500.0000 mL | Freq: Once | INTRAVENOUS | Status: AC
  Administered 2016-06-03: 500 mL via INTRAVENOUS

## 2016-06-03 MED ORDER — SODIUM CHLORIDE 0.9 % IV BOLUS (SEPSIS)
1000.0000 mL | Freq: Once | INTRAVENOUS | Status: AC
Start: 2016-06-03 — End: 2016-06-03
  Administered 2016-06-03: 1000 mL via INTRAVENOUS

## 2016-06-03 MED ORDER — SODIUM CHLORIDE 0.9 % IV SOLN
INTRAVENOUS | Status: DC
  Administered 2016-06-03: 21:00:00 via INTRAVENOUS

## 2016-06-03 MED ORDER — IOPAMIDOL (ISOVUE-300) INJECTION 61%
100.0000 mL | Freq: Once | INTRAVENOUS | Status: AC | PRN
Start: 2016-06-03 — End: 2016-06-03
  Administered 2016-06-03: 100 mL via INTRAVENOUS

## 2016-06-03 MED ORDER — SODIUM CHLORIDE 0.9 % IV BOLUS (SEPSIS)
1000.0000 mL | Freq: Once | INTRAVENOUS | Status: AC
  Administered 2016-06-03: 1000 mL via INTRAVENOUS

## 2016-06-03 MED ORDER — PIPERACILLIN-TAZOBACTAM 3.375 G IVPB 30 MIN
3.3750 g | Freq: Once | INTRAVENOUS | Status: AC
  Administered 2016-06-04: 3.375 g via INTRAVENOUS
  Filled 2016-06-03: qty 50

## 2016-06-03 NOTE — ED Provider Notes (Signed)
[facility]-EMERGENCY DEPT Provider Note   CSN: [id number] Arrival date & time: 06/03/16  1950     History   Chief Complaint Chief Complaint  Patient presents with  . Near Syncope    HPI Heather Wang is a 56 y.o. female.  HPI  Pt was seen at 2000. Per pt, c/o gradual onset and persistence of constant cough and multiple intermittent episodes of N/V/D that began earlier today. Has been associated with lower abd "pain."  Pt states she was dx pneumonia 2 weeks ago and tx with abx. Pt states this evening she "felt sweaty" and had near syncopal episode. EMS gave IV NS and IV zofran en route. Denies back/flank pain, no CP/SOB, no fevers, no rash, no black or blood in stools or emesis, no focal motor weakness, no tingling/numbness in extremities.    Past Medical History:  Diagnosis Date  . Anxiety   . Chronic back pain   . COPD (chronic obstructive pulmonary disease) (HCC)    Diagnosed in August 2016  . Graves disease   . Osteopetrosis   . Panic attacks   . Pneumonia   . Thyroid disease     Patient Active Problem List   Diagnosis Date Noted  . CAP (community acquired pneumonia) 01/29/2011  . Hyperthyroidism 01/29/2011  . COPD (chronic obstructive pulmonary disease) (HCC) 01/29/2011  . Tobacco abuse 01/29/2011  . Pleurisy 01/29/2011  . Hyponatremia 01/29/2011  . Depression 01/29/2011  . Anxiety 01/29/2011  . TRIGGER FINGER, RIGHT THUMB 02/02/2010  . NECK PAIN, CHRONIC 04/22/2008  . H N P-CERVICAL 04/08/2008  . CERVICAL SPASM 04/08/2008    Past Surgical History:  Procedure Laterality Date  . NEPHRECTOMY     native   . TOTAL ABDOMINAL HYSTERECTOMY    . TUBAL LIGATION       Home Medications    Prior to Admission medications   Medication Sig Start Date End Date Taking? Authorizing Provider  albuterol (PROVENTIL HFA;VENTOLIN HFA) 108 (90 Base) MCG/ACT inhaler Inhale into the lungs. 08/13/15 08/12/16  Historical Provider, MD  ALPRAZolam Prudy Feeler) 1 MG tablet Take 1  tablet (1 mg total) by mouth 4 (four) times daily. 04/19/16   Myrlene Broker, MD  budesonide-formoterol [facility]) 160-4.5 MCG/ACT inhaler Inhale 2 puffs into the lungs 2 (two) times daily.    Historical Provider, MD  denosumab (PROLIA) 60 MG/ML SOLN injection Inject 60 mg into the skin every 6 (six) months. Administer in upper arm, thigh, or abdomen    Historical Provider, MD  sertraline (ZOLOFT) 100 MG tablet Take 2 tablets (200 mg total) by mouth daily. 04/19/16   Myrlene Broker, MD    Family History Family History  Problem Relation Age of Onset  . Anxiety disorder Mother   . Depression Mother   . Heart defect      family history   . Cancer      family history   . Arthritis      family history   . Depression Brother   . Anxiety disorder Maternal Grandmother   . Depression Maternal Grandmother     Social History Social History  Substance Use Topics  . Smoking status: Current Every Day Smoker    Packs/day: 1.00    Types: Cigarettes  . Smokeless tobacco: Never Used  . Alcohol use No     Allergies   Nsaids and Sulfa antibiotics   Review of Systems Review of Systems ROS: Statement: All systems negative except as marked or noted in the HPI;  Constitutional: Negative for fever and chills. ; ; Eyes: Negative for eye pain, redness and discharge. ; ; ENMT: Negative for ear pain, hoarseness, nasal congestion, sinus pressure and sore throat. ; ; Cardiovascular: Negative for chest pain, palpitations, dyspnea and peripheral edema. ; ; Respiratory: +cough. Negative for wheezing and stridor. ; ; Gastrointestinal: +N/V/D, abd pain. Negative for blood in stool, hematemesis, jaundice and rectal bleeding. . ; ; Genitourinary: Negative for dysuria, flank pain and hematuria. ; ; Musculoskeletal: Negative for back pain and neck pain. Negative for swelling and trauma.; ; Skin: Negative for pruritus, rash, abrasions, blisters, bruising and skin lesion.; ; Neuro: +near syncope. Negative for headache  and neck stiffness. Negative for altered level of consciousness, altered mental status, extremity weakness, paresthesias, involuntary movement, seizure and syncope.      Physical Exam Updated Vital Signs BP (!) 74/47   Pulse 66   Temp 98 F (36.7 C) (Oral)   Resp 20   Ht 5\' 4"  (1.626 m)   Wt 107 lb (48.5 kg)   SpO2 96%   BMI 18.37 kg/m    Patient Vitals for the past 24 hrs:  BP Temp Temp src Pulse Resp SpO2 Height Weight  06/03/16 2220 (!) 97/46 - - 80 20 97 % - -  06/03/16 2145 91/58 - - 73 (!) 27 99 % - -  06/03/16 2045 (!) 87/53 - - 66 (!) 28 99 % - -  06/03/16 2040 (!) 93/52 - - 78 19 100 % - -  06/03/16 2035 (!) 80/48 - - 63 16 98 % - -  06/03/16 2030 (!) 81/48 - - (!) 58 25 99 % - -  06/03/16 2015 (!) 72/45 - - 69 19 98 % - -  06/03/16 1952 (!) 74/47 98 F (36.7 C) Oral 66 20 96 % 5\' 4"  (1.626 m) 107 lb (48.5 kg)      Physical Exam 2005: Physical examination:  Nursing notes reviewed; Vital signs and O2 SAT reviewed;  Constitutional: Well developed, Well nourished, Well hydrated, In no acute distress; Head:  Normocephalic, atraumatic; Eyes: EOMI, PERRL, No scleral icterus; ENMT: Mouth and pharynx normal, Mucous membranes moist; Neck: Supple, Full range of motion, No lymphadenopathy; Cardiovascular: Regular rate and rhythm, No gallop; Respiratory: Breath sounds clear & equal bilaterally, No wheezes.  Speaking full sentences with ease, Normal respiratory effort/excursion; Chest: Nontender, Movement normal; Abdomen: Soft, +suprapubic tenderness to palp. No rebound or guarding.  Nondistended, Normal bowel sounds; Genitourinary: No CVA tenderness; Extremities: Pulses normal, No tenderness, No edema, No calf edema or asymmetry.; Neuro: AA&Ox3, Major CN grossly intact.  Speech clear. No gross focal motor or sensory deficits in extremities.; Skin: Color normal, Warm, Dry.    ED Treatments / Results  Labs (all labs ordered are listed, but only abnormal results are  displayed)   EKG  EKG Interpretation  Date/Time:  Saturday June 03 2016 20:08:52 EST Ventricular Rate:  65 PR Interval:    QRS Duration: 98 QT Interval:  423 QTC Calculation: 440 R Axis:   50 Text Interpretation:  Sinus rhythm Low voltage, precordial leads RSR' in V1 or V2, right VCD or RVH Borderline T abnormalities, anterior leads Artifact When compared with ECG of 10/23/2012 No significant change was found Confirmed by [facility]  MD, Nicholos Johns [phone]) on 06/03/2016 8:16:25 PM       Radiology   Procedures Procedures (including critical care time)  Medications Ordered in ED Medications  sodium chloride 0.9 % bolus 1,000 mL (not administered)  Initial Impression / Assessment and Plan / ED Course  I have reviewed the triage vital signs and the nursing notes.  Pertinent labs & imaging results that were available during my care of the patient were reviewed by me and considered in my medical decision making (see chart for details).  MDM Reviewed:  previous chart, nursing note and vitals Reviewed previous: labs and ECG Interpretation: labs, ECG, x-ray and CT scan Total time providing critical care: 30-74 minutes. This excludes time spent performing separately reportable procedures and services. Consults: admitting MD   CRITICAL CARE Performed by: Laray Anger Total critical care time: 35 minutes Critical care time was exclusive of separately billable procedures and treating other patients. Critical care was necessary to treat or prevent imminent or life-threatening deterioration. Critical care was time spent personally by me on the following activities: development of treatment plan with patient and/or surrogate as well as nursing, discussions with consultants, evaluation of patient's response to treatment, examination of patient, obtaining history from patient or surrogate, ordering and performing treatments and interventions, ordering and review of laboratory  studies, ordering and review of radiographic studies, pulse oximetry and re-evaluation of patient's condition.   Results for orders placed or performed during the hospital encounter of 06/03/16  C difficile quick scan w PCR reflex  Result Value Ref Range   C Diff antigen NEGATIVE NEGATIVE   C Diff toxin NEGATIVE NEGATIVE   C Diff interpretation No C. difficile detected.   Comprehensive metabolic panel  Result Value Ref Range   Sodium 136 135 - 145 mmol/L   Potassium 3.4 (L) 3.5 - 5.1 mmol/L   Chloride 105 101 - 111 mmol/L   CO2 22 22 - 32 mmol/L   Glucose, Bld 115 (H) 65 - 99 mg/dL   BUN 9 6 - 20 mg/dL   Creatinine, Ser 1.61 0.44 - 1.00 mg/dL   Calcium 8.2 (L) 8.9 - 10.3 mg/dL   Total Protein 5.8 (L) 6.5 - 8.1 g/dL   Albumin 3.2 (L) 3.5 - 5.0 g/dL   AST 27 15 - 41 U/L   ALT 11 (L) 14 - 54 U/L   Alkaline Phosphatase 72 38 - 126 U/L   Total Bilirubin 0.3 0.3 - 1.2 mg/dL   GFR calc non Af Amer >60 >60 mL/min   GFR calc Af Amer >60 >60 mL/min   Anion gap 9 5 - 15  Lipase, blood  Result Value Ref Range   Lipase 16 11 - 51 U/L  Troponin I  Result Value Ref Range   Troponin I <0.03 <0.03 ng/mL  Lactic acid, plasma  Result Value Ref Range   Lactic Acid, Venous 1.5 0.5 - 1.9 mmol/L  CBC with Differential  Result Value Ref Range   WBC 3.4 (L) 4.0 - 10.5 K/uL   RBC 4.06 3.87 - 5.11 MIL/uL   Hemoglobin 12.2 12.0 - 15.0 g/dL   HCT 09.6 04.5 - 40.9 %   MCV 90.9 78.0 - 100.0 fL   MCH 30.0 26.0 - 34.0 pg   MCHC 33.1 30.0 - 36.0 g/dL   RDW 81.1 91.4 - 78.2 %   Platelets 137 (L) 150 - 400 K/uL   Neutrophils Relative % 64 %   Neutro Abs 2.1 1.7 - 7.7 K/uL   Lymphocytes Relative 28 %   Lymphs Abs 0.9 0.7 - 4.0 K/uL   Monocytes Relative 8 %   Monocytes Absolute 0.3 0.1 - 1.0 K/uL   Eosinophils Relative 0 %   Eosinophils Absolute 0.0  0.0 - 0.7 K/uL   Basophils Relative 0 %   Basophils Absolute 0.0 0.0 - 0.1 K/uL  Urinalysis, Routine w reflex microscopic  Result Value Ref Range    Color, Urine AMBER (A) YELLOW   APPearance CLEAR CLEAR   Specific Gravity, Urine 1.035 (H) 1.005 - 1.030   pH 5.0 5.0 - 8.0   Glucose, UA NEGATIVE NEGATIVE mg/dL   Hgb urine dipstick NEGATIVE NEGATIVE   Bilirubin Urine NEGATIVE NEGATIVE   Ketones, ur 20 (A) NEGATIVE mg/dL   Protein, ur NEGATIVE NEGATIVE mg/dL   Nitrite NEGATIVE NEGATIVE   Leukocytes, UA NEGATIVE NEGATIVE  POC occult blood, ED  Result Value Ref Range   Fecal Occult Bld POSITIVE (A) NEGATIVE    Ct Abdomen Pelvis W Contrast Result Date: 06/03/2016 CLINICAL DATA:  Nausea and vomiting x2 with diarrhea. Treated for recent pneumonia last week. Near syncope this evening. EXAM: CT ABDOMEN AND PELVIS WITH CONTRAST TECHNIQUE: Multidetector CT imaging of the abdomen and pelvis was performed using the standard protocol following bolus administration of intravenous contrast. CONTRAST:  ISOVUE-300 IOPAMIDOL (ISOVUE-300) INJECTION 61% COMPARISON:  CT report from 01/25/2011 abdomen and pelvis FINDINGS: Lower chest: Heart is normal in size. Bibasilar faint airspace opacities in the right middle lobe, lingula and left lower lobe are identified in keeping with reported pneumonia. Hepatobiliary: Moderate distention of the gallbladder without stones or wall thickening. Findings could be due to a fasting state. No space-occupying mass of liver. Mild intrahepatic biliary dilatation may be secondary to the distended gallbladder. Pancreas: Negative Spleen: Normal Adrenals/Urinary Tract: Normal bilateral adrenal glands. 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 No obstructive uropathy. Stomach/Bowel: Transmural inflammatory or infectious thickening of the transverse colon through rectum consistent with proctocolitis. No small bowel dilatation nor obstruction. The appendix is not visualized but no definite pericecal inflammation. Vascular/Lymphatic: Aortic atherosclerosis. No enlarged abdominal or pelvic lymph nodes.  Reproductive: Status post hysterectomy. No adnexal masses. Other: No abdominal wall hernia or abnormality. No abdominopelvic ascites. Musculoskeletal: No acute or significant osseous findings. IMPRESSION: 1. Transmural thickening of transverse colon through rectum consistent with a proctocolitis likely postinfectious or postinflammatory. 2. Patchy bibasilar airspace opacities in the right middle lobe, lingula and left lower lobe consistent with reported history of recent pneumonia. Findings may reflect appears residual pneumonia. 3. Right nephrectomy. 4. Aortic atherosclerosis. Electronically Signed   By: Tollie Eth M.D.   On: 06/03/2016 23:51   Dg Chest Port 1 View Result Date: 06/03/2016 CLINICAL DATA:  Cough and syncope today. EXAM: PORTABLE CHEST 1 VIEW COMPARISON:  05/18/2016 and prior radiographs FINDINGS: The cardiomediastinal silhouette is unremarkable. There is no evidence of focal airspace disease, pulmonary edema, suspicious pulmonary nodule/mass, pleural effusion, or pneumothorax. No acute bony abnormalities are identified. IMPRESSION: No active disease. Electronically Signed   By: Harmon Pier M.D.   On: 06/03/2016 20:39    2235:  Pt with several episodes of N/V/D while in the ED. Stool sent for GI pathogen panel and cdiff. CT A/P pending.   0010:  Hypotensive on arrival. Multiple IVF boluses given with slow improvement.  Lactic acid not elevated. UA with elevated SG and ketones. EPIC chart reviewed: pt's baseline BP 90-110's. CT as above; IV zosyn ordred. Dx and testing d/w pt and family.  Questions answered.  Verb understanding, agreeable to admit.  T/C to [facility] Dr. Sharl Ma, case discussed, including:  HPI, pertinent PM/SHx, VS/PE, dx testing, ED course and treatment:  Agreeable to admit, requests to write  temporary orders, obtain stepdown bed to team APAdmits.     Final Clinical Impressions(s) / ED Diagnoses   Final diagnoses:  None    New Prescriptions New Prescriptions   No  medications on file    Samuel JesterKathleen Ricarda Atayde, DO 06/07/16 1534

## 2016-06-03 NOTE — ED Notes (Signed)
100mL NS and 4mg  of Zofran PTA.

## 2016-06-03 NOTE — ED Notes (Signed)
Pt had one episode of D/ and V/.

## 2016-06-03 NOTE — ED Triage Notes (Signed)
Pt c/o N/, V/ X2, and D/ X2 today. Finished antibx for pneumonia last week. Had near syncopal episode this evening.

## 2016-06-04 ENCOUNTER — Encounter (HOSPITAL_COMMUNITY): Payer: Self-pay | Admitting: Emergency Medicine

## 2016-06-04 DIAGNOSIS — K529 Noninfective gastroenteritis and colitis, unspecified: Secondary | ICD-10-CM | POA: Diagnosis not present

## 2016-06-04 DIAGNOSIS — F329 Major depressive disorder, single episode, unspecified: Secondary | ICD-10-CM | POA: Diagnosis present

## 2016-06-04 DIAGNOSIS — R55 Syncope and collapse: Secondary | ICD-10-CM | POA: Diagnosis not present

## 2016-06-04 DIAGNOSIS — R197 Diarrhea, unspecified: Secondary | ICD-10-CM | POA: Diagnosis not present

## 2016-06-04 DIAGNOSIS — Z886 Allergy status to analgesic agent status: Secondary | ICD-10-CM | POA: Diagnosis not present

## 2016-06-04 DIAGNOSIS — J440 Chronic obstructive pulmonary disease with acute lower respiratory infection: Secondary | ICD-10-CM | POA: Diagnosis present

## 2016-06-04 DIAGNOSIS — D696 Thrombocytopenia, unspecified: Secondary | ICD-10-CM | POA: Diagnosis present

## 2016-06-04 DIAGNOSIS — I959 Hypotension, unspecified: Secondary | ICD-10-CM

## 2016-06-04 DIAGNOSIS — Z882 Allergy status to sulfonamides status: Secondary | ICD-10-CM | POA: Diagnosis not present

## 2016-06-04 DIAGNOSIS — J189 Pneumonia, unspecified organism: Secondary | ICD-10-CM | POA: Diagnosis present

## 2016-06-04 DIAGNOSIS — Z79899 Other long term (current) drug therapy: Secondary | ICD-10-CM | POA: Diagnosis not present

## 2016-06-04 DIAGNOSIS — E876 Hypokalemia: Secondary | ICD-10-CM | POA: Diagnosis present

## 2016-06-04 DIAGNOSIS — A09 Infectious gastroenteritis and colitis, unspecified: Secondary | ICD-10-CM | POA: Diagnosis present

## 2016-06-04 DIAGNOSIS — R112 Nausea with vomiting, unspecified: Secondary | ICD-10-CM | POA: Diagnosis not present

## 2016-06-04 DIAGNOSIS — F17210 Nicotine dependence, cigarettes, uncomplicated: Secondary | ICD-10-CM | POA: Diagnosis present

## 2016-06-04 DIAGNOSIS — J181 Lobar pneumonia, unspecified organism: Secondary | ICD-10-CM

## 2016-06-04 DIAGNOSIS — I9589 Other hypotension: Secondary | ICD-10-CM | POA: Diagnosis present

## 2016-06-04 DIAGNOSIS — M810 Age-related osteoporosis without current pathological fracture: Secondary | ICD-10-CM | POA: Diagnosis present

## 2016-06-04 DIAGNOSIS — Z7951 Long term (current) use of inhaled steroids: Secondary | ICD-10-CM | POA: Diagnosis not present

## 2016-06-04 LAB — COMPREHENSIVE METABOLIC PANEL
ALBUMIN: 2.8 g/dL — AB (ref 3.5–5.0)
ALK PHOS: 66 U/L (ref 38–126)
ALT: 22 U/L (ref 14–54)
AST: 50 U/L — AB (ref 15–41)
Anion gap: 6 (ref 5–15)
BUN: 7 mg/dL (ref 6–20)
CALCIUM: 7.6 mg/dL — AB (ref 8.9–10.3)
CHLORIDE: 109 mmol/L (ref 101–111)
CO2: 19 mmol/L — AB (ref 22–32)
CREATININE: 0.67 mg/dL (ref 0.44–1.00)
GFR calc Af Amer: >60 mL/min (ref >60)
GFR calc non Af Amer: >60 mL/min (ref >60)
GLUCOSE: 106 mg/dL — AB (ref 65–99)
Potassium: 4 mmol/L (ref 3.5–5.1)
SODIUM: 134 mmol/L — AB (ref 135–145)
Total Bilirubin: 0.4 mg/dL (ref 0.3–1.2)
Total Protein: 5.2 g/dL — ABNORMAL LOW (ref 6.5–8.1)

## 2016-06-04 LAB — GASTROINTESTINAL PANEL BY PCR, STOOL (REPLACES STOOL CULTURE)
Adenovirus F40/41: NOT DETECTED
Astrovirus: NOT DETECTED
Campylobacter species: NOT DETECTED
Cryptosporidium: NOT DETECTED
Cyclospora cayetanensis: NOT DETECTED
ENTEROAGGREGATIVE E COLI (EAEC): NOT DETECTED
ENTEROTOXIGENIC E COLI (ETEC): NOT DETECTED
Entamoeba histolytica: NOT DETECTED
Enteropathogenic E coli (EPEC): NOT DETECTED
GIARDIA LAMBLIA: NOT DETECTED
NOROVIRUS GI/GII: NOT DETECTED
Plesimonas shigelloides: NOT DETECTED
ROTAVIRUS A: NOT DETECTED
SALMONELLA SPECIES: NOT DETECTED
SAPOVIRUS (I, II, IV, AND V): NOT DETECTED
SHIGA LIKE TOXIN PRODUCING E COLI (STEC): NOT DETECTED
Shigella/Enteroinvasive E coli (EIEC): NOT DETECTED
Vibrio cholerae: NOT DETECTED
Vibrio species: NOT DETECTED
Yersinia enterocolitica: NOT DETECTED

## 2016-06-04 LAB — C DIFFICILE QUICK SCREEN W PCR REFLEX
C DIFFICILE (CDIFF) TOXIN: NEGATIVE
C DIFFICLE (CDIFF) ANTIGEN: NEGATIVE
C Diff interpretation: NOT DETECTED

## 2016-06-04 LAB — CBC
HEMATOCRIT: 34.4 % — AB (ref 36.0–46.0)
HEMOGLOBIN: 11.4 g/dL — AB (ref 12.0–15.0)
MCH: 30 pg (ref 26.0–34.0)
MCHC: 33.1 g/dL (ref 30.0–36.0)
MCV: 90.5 fL (ref 78.0–100.0)
Platelets: 116 10*3/uL — ABNORMAL LOW (ref 150–400)
RBC: 3.8 MIL/uL — ABNORMAL LOW (ref 3.87–5.11)
RDW: 14.6 % (ref 11.5–15.5)
WBC: 3.3 10*3/uL — ABNORMAL LOW (ref 4.0–10.5)

## 2016-06-04 LAB — LACTIC ACID, PLASMA: LACTIC ACID, VENOUS: 1.1 mmol/L (ref 0.5–1.9)

## 2016-06-04 LAB — CORTISOL: Cortisol, Plasma: 21.2 ug/dL

## 2016-06-04 LAB — MRSA PCR SCREENING: MRSA by PCR: NEGATIVE

## 2016-06-04 MED ORDER — SERTRALINE HCL 50 MG PO TABS
200.0000 mg | ORAL_TABLET | Freq: Every day | ORAL | Status: DC
  Administered 2016-06-04 – 2016-06-06 (×3): 200 mg via ORAL
  Filled 2016-06-04 (×3): qty 4

## 2016-06-04 MED ORDER — SODIUM CHLORIDE 0.9 % IV SOLN: INTRAVENOUS | Status: DC

## 2016-06-04 MED ORDER — SODIUM BICARBONATE 8.4 % IV SOLN
INTRAVENOUS | Status: DC
  Administered 2016-06-04 – 2016-06-05 (×3): via INTRAVENOUS
  Filled 2016-06-04 (×5): qty 1000

## 2016-06-04 MED ORDER — PIPERACILLIN-TAZOBACTAM 3.375 G IVPB
3.3750 g | Freq: Three times a day (TID) | INTRAVENOUS | Status: DC
  Administered 2016-06-04 – 2016-06-06 (×8): 3.375 g via INTRAVENOUS
  Filled 2016-06-04 (×7): qty 50

## 2016-06-04 MED ORDER — MOMETASONE FURO-FORMOTEROL FUM 200-5 MCG/ACT IN AERO
2.0000 | INHALATION_SPRAY | Freq: Two times a day (BID) | RESPIRATORY_TRACT | Status: DC
  Administered 2016-06-04 – 2016-06-06 (×5): 2 via RESPIRATORY_TRACT
  Filled 2016-06-04: qty 8.8

## 2016-06-04 MED ORDER — ONDANSETRON HCL 4 MG PO TABS: 4.0000 mg | ORAL_TABLET | Freq: Four times a day (QID) | ORAL | Status: DC | PRN

## 2016-06-04 MED ORDER — SODIUM CHLORIDE 0.9 % IV SOLN
INTRAVENOUS | Status: DC
  Administered 2016-06-04: 04:00:00 via INTRAVENOUS

## 2016-06-04 MED ORDER — ONDANSETRON HCL 4 MG/2ML IJ SOLN
4.0000 mg | Freq: Four times a day (QID) | INTRAMUSCULAR | Status: DC | PRN
  Administered 2016-06-04: 4 mg via INTRAVENOUS

## 2016-06-04 MED ORDER — POTASSIUM CHLORIDE CRYS ER 20 MEQ PO TBCR
40.0000 meq | EXTENDED_RELEASE_TABLET | Freq: Once | ORAL | Status: AC
  Administered 2016-06-04: 40 meq via ORAL
  Filled 2016-06-04: qty 2

## 2016-06-04 MED ORDER — GUAIFENESIN ER 600 MG PO TB12
1200.0000 mg | ORAL_TABLET | Freq: Two times a day (BID) | ORAL | Status: DC
  Administered 2016-06-04 – 2016-06-06 (×5): 1200 mg via ORAL
  Filled 2016-06-04 (×5): qty 2

## 2016-06-04 MED ORDER — IPRATROPIUM-ALBUTEROL 0.5-2.5 (3) MG/3ML IN SOLN
3.0000 mL | Freq: Four times a day (QID) | RESPIRATORY_TRACT | Status: DC
  Administered 2016-06-04: 3 mL via RESPIRATORY_TRACT
  Filled 2016-06-04 (×2): qty 3

## 2016-06-04 MED ORDER — ACETAMINOPHEN 500 MG PO TABS
1000.0000 mg | ORAL_TABLET | Freq: Four times a day (QID) | ORAL | Status: DC | PRN
  Filled 2016-06-04: qty 2

## 2016-06-04 MED ORDER — ALPRAZOLAM 0.5 MG PO TABS
1.0000 mg | ORAL_TABLET | Freq: Four times a day (QID) | ORAL | Status: DC
  Administered 2016-06-04 – 2016-06-06 (×9): 1 mg via ORAL
  Filled 2016-06-04 (×9): qty 2

## 2016-06-04 MED ORDER — NICOTINE 21 MG/24HR TD PT24
21.0000 mg | MEDICATED_PATCH | Freq: Every day | TRANSDERMAL | Status: DC
  Administered 2016-06-04 – 2016-06-06 (×3): 21 mg via TRANSDERMAL
  Filled 2016-06-04 (×3): qty 1

## 2016-06-04 MED ORDER — ONDANSETRON HCL 4 MG/2ML IJ SOLN
4.0000 mg | Freq: Three times a day (TID) | INTRAMUSCULAR | Status: AC | PRN
  Filled 2016-06-04: qty 2

## 2016-06-04 NOTE — H&P (Signed)
TRH H&P    Patient Demographics:    Heather Wang, is a 56 y.o. female  MRN: [id number]  DOB - 23-Sep-1960  Admit Date - 06/03/2016  Referring MD/NP/PA: Dr. Clarene Duke  Outpatient Primary MD for the patient is Josue Hector, MD  Patient coming from: Home  Chief Complaint  Patient presents with  . Near Syncope      HPI:    Heather Wang  is a 56 y.o. female, With history of COPD, osteoporosis who came to the hospital after patient had developed vomiting and diarrhea and had near syncopal episode at home. Patient says that she had multiple episodes of nausea vomiting and diarrhea which began earlier today also had lower abdominal pain. In the evening ration went to bathroom and was sitting on commode when she felt very sweaty but did not pass out. EMS was called and patient brought to the hospital. She denies blood in the stool or vomitus. No fever or chills. No chest pain or shortness of breath. Patient recently was treated for community-acquired pneumonia as outpatient, and she was in prescribe antibiotics twice by her PCP as per patient. Currently she what antibiotics which he took for 7 days, and was prescribed another antibiotics which she just completed 2 days ago. C. difficile PCR done in the ED was negative. CT abdomen pelvis shows transpedicular thickening of transverse colon through rectum consistent with proctocolitis likely postinfectious or postinflammatory. It also showed patchy bibasilar airspace opacity in the right middle lobe lingula and left lower lobe consistent with reported history of recent pneumonia. Findings  may reflect residual pneumonia.  Lab work showed WBC 3.4, lactic acid 1.5   Review of systems:    In addition to the HPI above,  No Fever-chills, No Headache, No changes with Vision or hearing, No problems swallowing food or Liquids, No Chest pain, Cough or Shortness of  Breath, No Blood in stool or Urine, No dysuria, No new skin rashes or bruises, No new joints pains-aches,  No new weakness, tingling, numbness in any extremity, No recent weight gain or loss, No polyuria, polydypsia or polyphagia, No significant Mental Stressors.  A full 10 point Review of Systems was done, except as stated above, all other Review of Systems were negative.   With Past History of the following :    Past Medical History:  Diagnosis Date  . Anxiety   . Chronic back pain   . COPD (chronic obstructive pulmonary disease) (HCC)    Diagnosed in August 2016  . Graves disease   . Osteopetrosis   . Panic attacks   . Pneumonia   . Thyroid disease       Past Surgical History:  Procedure Laterality Date  . NEPHRECTOMY     native   . TOTAL ABDOMINAL HYSTERECTOMY    . TUBAL LIGATION        Social History:      Social History  Substance Use Topics  . Smoking status: Current Every Day Smoker    Packs/day: 1.00    Types: Cigarettes  .  Smokeless tobacco: Never Used  . Alcohol use No       Family History :     Family History  Problem Relation Age of Onset  . Anxiety disorder Mother   . Depression Mother   . Heart defect      family history   . Cancer      family history   . Arthritis      family history   . Depression Brother   . Anxiety disorder Maternal Grandmother   . Depression Maternal Grandmother       Home Medications:   Prior to Admission medications   Medication Sig Start Date End Date Taking? Authorizing Provider  acetaminophen (TYLENOL) 500 MG tablet Take 1,000 mg by mouth every 6 (six) hours as needed.   Yes Historical Provider, MD  albuterol (PROVENTIL HFA;VENTOLIN HFA) 108 (90 Base) MCG/ACT inhaler Inhale into the lungs. 08/13/15 08/12/16 Yes Historical Provider, MD  ALPRAZolam Prudy Feeler) 1 MG tablet Take 1 tablet (1 mg total) by mouth 4 (four) times daily. 04/19/16  Yes Myrlene Broker, MD  budesonide-formoterol [facility]) 160-4.5  MCG/ACT inhaler Inhale 2 puffs into the lungs 2 (two) times daily.   Yes Historical Provider, MD  denosumab (PROLIA) 60 MG/ML SOLN injection Inject 60 mg into the skin every 6 (six) months. Administer in upper arm, thigh, or abdomen   Yes Historical Provider, MD  sertraline (ZOLOFT) 100 MG tablet Take 2 tablets (200 mg total) by mouth daily. Patient not taking: Reported on 06/03/2016 04/19/16   Myrlene Broker, MD     Allergies:     Allergies  Allergen Reactions  . Nsaids Other (See Comments)    Kidney condition  . Sulfa Antibiotics Nausea And Vomiting     Physical Exam:   Vitals  Blood pressure (!) 93/52, pulse 70, temperature 98 F (36.7 C), temperature source Oral, resp. rate 20, height 5\' 4"  (1.626 m), weight 48.5 kg (107 lb), SpO2 98 %.  1.  General: Appears in no acute distress  2. Psychiatric:  Intact judgement and  insight, awake alert, oriented x 3.  3. Neurologic: No focal neurological deficits, all cranial nerves intact.Strength 5/5 all 4 extremities, sensation intact all 4 extremities, plantars down going.  4. Eyes :  anicteric sclerae, moist conjunctivae with no lid lag. PERRLA.  5. ENMT:  Oropharynx clear with moist mucous membranes and good dentition  6. Neck:  supple, no cervical lymphadenopathy appriciated, No thyromegaly  7. Respiratory : Normal respiratory effort, good air movement bilaterally,clear to  auscultation bilaterally  8. Cardiovascular : RRR, no gallops, rubs or murmurs, no leg edema  9. Gastrointestinal:  Positive bowel sounds, abdomen soft, tender to palpation in left lower quadrant,no hepatosplenomegaly, no rigidity or guarding       10. Skin:  No cyanosis, normal texture and turgor, no rash, lesions or ulcers  11.Musculoskeletal:  Good muscle tone,  joints appear normal , no effusions,  normal range of motion    Data Review:    CBC  Recent Labs Lab 06/03/16 2017  WBC 3.4*  HGB 12.2  HCT 36.9  PLT 137*  MCV 90.9  MCH  30.0  MCHC 33.1  RDW 14.4  LYMPHSABS 0.9  MONOABS 0.3  EOSABS 0.0  BASOSABS 0.0   ------------------------------------------------------------------------------------------------------------------  Chemistries   Recent Labs Lab 06/03/16 2017  NA 136  K 3.4*  CL 105  CO2 22  GLUCOSE 115*  BUN 9  CREATININE 0.73  CALCIUM 8.2*  AST 27  ALT 11*  ALKPHOS 72  BILITOT 0.3   ------------------------------------------------------------------------------------------------------------------  ------------------------------------------------------------------------------------------------------------------ GFR: Estimated Creatinine Clearance: 61.6 mL/min (by C-G formula based on SCr of 0.73 mg/dL). Liver Function Tests:  Recent Labs Lab 06/03/16 2017  AST 27  ALT 11*  ALKPHOS 72  BILITOT 0.3  PROT 5.8*  ALBUMIN 3.2*    Recent Labs Lab 06/03/16 2017  LIPASE 16   No results for input(s): AMMONIA in the last 168 hours. Coagulation Profile: No results for input(s): INR, PROTIME in the last 168 hours. Cardiac Enzymes:  Recent Labs Lab 06/03/16 2017  TROPONINI <0.03    --------------------------------------------------------------------------------------------------------------- Urine analysis:    Component Value Date/Time   COLORURINE AMBER (A) 06/03/2016 2037   APPEARANCEUR CLEAR 06/03/2016 2037   LABSPEC 1.035 (H) 06/03/2016 2037   PHURINE 5.0 06/03/2016 2037   GLUCOSEU NEGATIVE 06/03/2016 2037   HGBUR NEGATIVE 06/03/2016 2037   BILIRUBINUR NEGATIVE 06/03/2016 2037   KETONESUR 20 (A) 06/03/2016 2037   PROTEINUR NEGATIVE 06/03/2016 2037   UROBILINOGEN 0.2 04/01/2012 1503   NITRITE NEGATIVE 06/03/2016 2037   LEUKOCYTESUR NEGATIVE 06/03/2016 2037      Imaging Results:    Ct Abdomen Pelvis W Contrast  Result Date: 06/03/2016 CLINICAL DATA:  Nausea and vomiting x2 with diarrhea. Treated for recent pneumonia last week. Near syncope this evening. EXAM: CT  ABDOMEN AND PELVIS WITH CONTRAST TECHNIQUE: Multidetector CT imaging of the abdomen and pelvis was performed using the standard protocol following bolus administration of intravenous contrast. CONTRAST:  ISOVUE-300 IOPAMIDOL (ISOVUE-300) INJECTION 61% COMPARISON:  CT report from 01/25/2011 abdomen and pelvis FINDINGS: Lower chest: Heart is normal in size. Bibasilar faint airspace opacities in the right middle lobe, lingula and left lower lobe are identified in keeping with reported pneumonia. Hepatobiliary: Moderate distention of the gallbladder without stones or wall thickening. Findings could be due to a fasting state. No space-occupying mass of liver. Mild intrahepatic biliary dilatation may be secondary to the distended gallbladder. Pancreas: Negative Spleen: Normal Adrenals/Urinary Tract: Normal bilateral adrenal glands. 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 No obstructive uropathy. Stomach/Bowel: Transmural inflammatory or infectious thickening of the transverse colon through rectum consistent with proctocolitis. No small bowel dilatation nor obstruction. The appendix is not visualized but no definite pericecal inflammation. Vascular/Lymphatic: Aortic atherosclerosis. No enlarged abdominal or pelvic lymph nodes. Reproductive: Status post hysterectomy. No adnexal masses. Other: No abdominal wall hernia or abnormality. No abdominopelvic ascites. Musculoskeletal: No acute or significant osseous findings. IMPRESSION: 1. Transmural thickening of transverse colon through rectum consistent with a proctocolitis likely postinfectious or postinflammatory. 2. Patchy bibasilar airspace opacities in the right middle lobe, lingula and left lower lobe consistent with reported history of recent pneumonia. Findings may reflect appears residual pneumonia. 3. Right nephrectomy. 4. Aortic atherosclerosis. Electronically Signed   By: Tollie Eth M.D.   On: 06/03/2016 23:51   Dg  Chest Port 1 View  Result Date: 06/03/2016 CLINICAL DATA:  Cough and syncope today. EXAM: PORTABLE CHEST 1 VIEW COMPARISON:  05/18/2016 and prior radiographs FINDINGS: The cardiomediastinal silhouette is unremarkable. There is no evidence of focal airspace disease, pulmonary edema, suspicious pulmonary nodule/mass, pleural effusion, or pneumothorax. No acute bony abnormalities are identified. IMPRESSION: No active disease. Electronically Signed   By: Harmon Pier M.D.   On: 06/03/2016 20:39    My personal review of EKG: Rhythm NSR   Assessment & Plan:    Active Problems:   CAP (community acquired pneumonia)   Nausea vomiting and diarrhea  Colitis   1. Proctocolitis- unclear etiology at this time, C. difficile PCR is negative. FOBT is positive. Started on Zosyn per pharmacy consultation. If patient does not improve with antibiotics  consider GI consultation in a.m. 2. Near syncope- likely from proctocolitis, improved after patient received IV fluids. Will closely monitor patient on stepdown unit. Continue IV normal saline at 125 ML per hour. 3. Community-acquired pneumonia- patient got treatment for community acquired pneumonia for over 10 days as outpatient, CT scan abdomen shows residual pneumonia, patient started on IV Zosyn as above. 4. Hypokalemia-potassium is 3.4, will give K-Dur 40 mg by mouth 1 and follow BMP in a.m. 5. History of depression-continue Zoloft.   DVT Prophylaxis-   Lovenox   AM Labs Ordered, also please review Full Orders  Family Communication: Admission, patients condition and plan of care including tests being ordered have been discussed with the patient and Her relative at bedside who indicate understanding and agree with the plan and Code Status.  Code Status:   Full code  Admission status: Observation    Time spent in minutes : 60 minutes   Jacqualine Weichel S M.D on 06/04/2016 at 12:42 AM  Between 7am to 7pm - Pager - [phone]. After 7pm go to  [email or web address] - password [email or web address]  [facility] - Office  [phone]

## 2016-06-04 NOTE — Progress Notes (Signed)
Patient admitted to the hospital earlier this morning by Dr. Sharl MaLama  Patient seen and examined. She is not dizzy on standing. She did notice some blood in stools when using the bathroom. No further vomiting. No persistent diarrhea. No chest pain. She has a cough. Lungs are clear bilaterally, abdomen is benign and no peripheral edema  Patient admitted to the hospital with hypotension and presyncopal symptoms. She was complaining of persistent vomiting and diarrhea. Also had some blood in stools. She recently was treated for pneumonia. CT abdomen showed colitis. C diff negative. GI pathogen panel has been ordered. She is being hydrated with IV fluids and hypotension is improving. Check cortisol. She has been started on intravenous zosyn. Follow up stool studies and cultures. Advance diet as tolerated.   Wang,Heather Wang

## 2016-06-04 NOTE — Progress Notes (Signed)
Pharmacy Antibiotic Note  Heather Wang is a 56 y.o. female admitted on 06/03/2016 with nausea, vomiting and diarrhea.  Pharmacy has been consulted for zosyn dosing for proctocolitis and residual PNA  Plan: Zosyn 3.375g IV q8h (4 hour infusion).  F/u cultures and clinical course  Height: 5\' 4"  (162.6 cm) Weight: 106 lb 7.7 oz (48.3 kg) IBW/kg (Calculated) : 54.7  Temp (24hrs), Avg:99.1 F (37.3 C), Min:98 F (36.7 C), Max:99.6 F (37.6 C)   Recent Labs Lab 06/03/16 2017 06/03/16 2340 06/04/16 0517  WBC 3.4*  --  3.3*  CREATININE 0.73  --  0.67  LATICACIDVEN 1.5 1.1  --     Estimated Creatinine Clearance: 61.3 mL/min (by C-G formula based on SCr of 0.67 mg/dL).    Allergies  Allergen Reactions  . Nsaids Other (See Comments)    Kidney condition  . Sulfa Antibiotics Nausea And Vomiting     Thank you for allowing pharmacy to be a part of this patient's care.  Talbert CageSeay, Maddy Graham Poteet 06/04/2016 10:45 AM

## 2016-06-05 DIAGNOSIS — R197 Diarrhea, unspecified: Secondary | ICD-10-CM

## 2016-06-05 DIAGNOSIS — R112 Nausea with vomiting, unspecified: Secondary | ICD-10-CM

## 2016-06-05 LAB — URINE CULTURE: CULTURE: NO GROWTH

## 2016-06-05 LAB — CBC
HCT: 33.2 % — ABNORMAL LOW (ref 36.0–46.0)
Hemoglobin: 11.1 g/dL — ABNORMAL LOW (ref 12.0–15.0)
MCH: 29.8 pg (ref 26.0–34.0)
MCHC: 33.4 g/dL (ref 30.0–36.0)
MCV: 89.2 fL (ref 78.0–100.0)
PLATELETS: 120 10*3/uL — AB (ref 150–400)
RBC: 3.72 MIL/uL — AB (ref 3.87–5.11)
RDW: 14.5 % (ref 11.5–15.5)
WBC: 6.1 10*3/uL (ref 4.0–10.5)

## 2016-06-05 LAB — HIV ANTIBODY (ROUTINE TESTING W REFLEX): HIV SCREEN 4TH GENERATION: NONREACTIVE

## 2016-06-05 LAB — BASIC METABOLIC PANEL
Anion gap: 5 (ref 5–15)
BUN: 6 mg/dL (ref 6–20)
CALCIUM: 7.7 mg/dL — AB (ref 8.9–10.3)
CO2: 30 mmol/L (ref 22–32)
CREATININE: 0.69 mg/dL (ref 0.44–1.00)
Chloride: 99 mmol/L — ABNORMAL LOW (ref 101–111)
Glucose, Bld: 121 mg/dL — ABNORMAL HIGH (ref 65–99)
Potassium: 3.4 mmol/L — ABNORMAL LOW (ref 3.5–5.1)
SODIUM: 134 mmol/L — AB (ref 135–145)

## 2016-06-05 MED ORDER — ALBUTEROL SULFATE (2.5 MG/3ML) 0.083% IN NEBU: 2.5000 mg | INHALATION_SOLUTION | RESPIRATORY_TRACT | Status: DC | PRN

## 2016-06-05 MED ORDER — IPRATROPIUM-ALBUTEROL 0.5-2.5 (3) MG/3ML IN SOLN
3.0000 mL | Freq: Four times a day (QID) | RESPIRATORY_TRACT | Status: DC
  Administered 2016-06-05 (×3): 3 mL via RESPIRATORY_TRACT
  Filled 2016-06-05 (×3): qty 3

## 2016-06-05 MED ORDER — SODIUM CHLORIDE 0.9 % IV SOLN
INTRAVENOUS | Status: DC
  Administered 2016-06-05: 10:00:00 via INTRAVENOUS

## 2016-06-05 MED ORDER — IPRATROPIUM-ALBUTEROL 0.5-2.5 (3) MG/3ML IN SOLN
3.0000 mL | Freq: Three times a day (TID) | RESPIRATORY_TRACT | Status: DC
  Administered 2016-06-06 (×2): 3 mL via RESPIRATORY_TRACT
  Filled 2016-06-05 (×2): qty 3

## 2016-06-05 MED ORDER — POTASSIUM CHLORIDE IN NACL 20-0.9 MEQ/L-% IV SOLN
INTRAVENOUS | Status: DC
  Administered 2016-06-05 – 2016-06-06 (×3): via INTRAVENOUS

## 2016-06-05 NOTE — Progress Notes (Signed)
Patient is not actively wheezing at this time. Will not awaken.

## 2016-06-05 NOTE — Progress Notes (Signed)
PROGRESS NOTE    Heather Wang  UJW:[id number]:[id number] DOB: October 20, 1960 DOA: 06/03/2016 PCP: Josue HectorNYLAND,LEONARD ROBERT, MD    Brief Narrative:  56 y/o female with history of COPD, who came to the hospital with vomiting and diarrhea. Found to have colitis on CT scan. She was hypotensive on arrival. Admitted to stepdown unit, started on IV fluids and zosyn. Stool studies have been unrevealing. If diarrhea does not improve, may need GI consult.   Assessment & Plan:   Active Problems:   CAP (community acquired pneumonia)   Nausea vomiting and diarrhea   Colitis   Hypokalemia   Hypotension   1. Colitis. Etiology is unclear. C diff and GI pathogen panel is negative. She did have fever last night. Would continue antibiotics for now. Overall she feels diarrhea is improving. Continue to monitor. She will need outpatient referral to GI for colonoscopy  2. Hypotension. Suspect this was related to volume depletion from GI losses. Cortisol level is normal. Continue IV hydration.  3. CAP. Imagining indicates possible right sided pneumonia. She is on antibiotics. Respiratory status appears stable. Continue to monitor.  4. Rectal bleeding. Suspect this is related to colitis. Hemoglobin stable and she has not had further bleeding. Continue to monitor.  5. Depression. Continue zoloft   DVT prophylaxis: scd Code Status: full Family Communication: no family present Disposition Plan: discharge home once improved.   Consultants:     Procedures:     Antimicrobials:   Zosyn 3/11>>    Subjective: Diarrhea is still present, although may be a little better today. No vomiting. No shortness of breath. She is not dizzy on standing. Had fever overnight.  Objective: Vitals:   06/05/16 0400 06/05/16 0500 06/05/16 0756 06/05/16 0800  BP: (!) 83/51 (!) 93/41  (!) 91/52  Pulse: (!) 51 (!) 52  (!) 58  Resp: 18 18  (!) 23  Temp: 97.9 F (36.6 C)   98.1 F (36.7 C)  TempSrc: Oral     SpO2: 97% 95% 99%  100%  Weight:  51.4 kg (113 lb 5.1 oz)    Height:        Intake/Output Summary (Last 24 hours) at 06/05/16 0916 Last data filed at 06/05/16 [id number]  Gross per 24 hour  Intake             2880 ml  Output                0 ml  Net             2880 ml   Filed Weights   06/04/16 0302 06/04/16 0500 06/05/16 0500  Weight: 48.3 kg (106 lb 7.7 oz) 48.3 kg (106 lb 7.7 oz) 51.4 kg (113 lb 5.1 oz)    Examination:  General exam: Appears calm and comfortable  Respiratory system: Clear to auscultation. Respiratory effort normal. Cardiovascular system: S1 & S2 heard, RRR. No JVD, murmurs, rubs, gallops or clicks. No pedal edema. Gastrointestinal system: Abdomen is nondistended, soft and nontender. No organomegaly or masses felt. Normal bowel sounds heard. Central nervous system: Alert and oriented. No focal neurological deficits. Extremities: Symmetric 5 x 5 power. Skin: No rashes, lesions or ulcers Psychiatry: Judgement and insight appear normal. Mood & affect appropriate.     Data Reviewed: I have personally reviewed following labs and imaging studies  CBC:  Recent Labs Lab 06/03/16 2017 06/04/16 0517 06/05/16 0424  WBC 3.4* 3.3* 6.1  NEUTROABS 2.1  --   --   HGB 12.2 11.4* 11.1*  HCT 36.9 34.4* 33.2*  MCV 90.9 90.5 89.2  PLT 137* 116* 120*   Basic Metabolic Panel:  Recent Labs Lab 06/03/16 2017 06/04/16 0517 06/05/16 0424  NA 136 134* 134*  K 3.4* 4.0 3.4*  CL 105 109 99*  CO2 22 19* 30  GLUCOSE 115* 106* 121*  BUN 9 7 6   CREATININE 0.73 0.67 0.69  CALCIUM 8.2* 7.6* 7.7*   GFR: Estimated Creatinine Clearance: 65.2 mL/min (by C-G formula based on SCr of 0.69 mg/dL). Liver Function Tests:  Recent Labs Lab 06/03/16 2017 06/04/16 0517  AST 27 50*  ALT 11* 22  ALKPHOS 72 66  BILITOT 0.3 0.4  PROT 5.8* 5.2*  ALBUMIN 3.2* 2.8*    Recent Labs Lab 06/03/16 2017  LIPASE 16   No results for input(s): AMMONIA in the last 168 hours. Coagulation Profile: No  results for input(s): INR, PROTIME in the last 168 hours. Cardiac Enzymes:  Recent Labs Lab 06/03/16 2017  TROPONINI <0.03   BNP (last 3 results) No results for input(s): PROBNP in the last 8760 hours. HbA1C: No results for input(s): HGBA1C in the last 72 hours. CBG: No results for input(s): GLUCAP in the last 168 hours. Lipid Profile: No results for input(s): CHOL, HDL, LDLCALC, TRIG, CHOLHDL, LDLDIRECT in the last 72 hours. Thyroid Function Tests: No results for input(s): TSH, T4TOTAL, FREET4, T3FREE, THYROIDAB in the last 72 hours. Anemia Panel: No results for input(s): VITAMINB12, FOLATE, FERRITIN, TIBC, IRON, RETICCTPCT in the last 72 hours. Sepsis Labs:  Recent Labs Lab 06/03/16 2017 06/03/16 2340  LATICACIDVEN 1.5 1.1    Recent Results (from the past 240 hour(s))  Gastrointestinal Panel by PCR , Stool     Status: None   Collection Time: 06/03/16 10:48 PM  Result Value Ref Range Status   Campylobacter species NOT DETECTED NOT DETECTED Final   Plesimonas shigelloides NOT DETECTED NOT DETECTED Final   Salmonella species NOT DETECTED NOT DETECTED Final   Yersinia enterocolitica NOT DETECTED NOT DETECTED Final   Vibrio species NOT DETECTED NOT DETECTED Final   Vibrio cholerae NOT DETECTED NOT DETECTED Final   Enteroaggregative E coli (EAEC) NOT DETECTED NOT DETECTED Final   Enteropathogenic E coli (EPEC) NOT DETECTED NOT DETECTED Final   Enterotoxigenic E coli (ETEC) NOT DETECTED NOT DETECTED Final   Shiga like toxin producing E coli (STEC) NOT DETECTED NOT DETECTED Final   Shigella/Enteroinvasive E coli (EIEC) NOT DETECTED NOT DETECTED Final   Cryptosporidium NOT DETECTED NOT DETECTED Final   Cyclospora cayetanensis NOT DETECTED NOT DETECTED Final   Entamoeba histolytica NOT DETECTED NOT DETECTED Final   Giardia lamblia NOT DETECTED NOT DETECTED Final   Adenovirus F40/41 NOT DETECTED NOT DETECTED Final   Astrovirus NOT DETECTED NOT DETECTED Final   Norovirus  GI/GII NOT DETECTED NOT DETECTED Final   Rotavirus A NOT DETECTED NOT DETECTED Final   Sapovirus (I, II, IV, and V) NOT DETECTED NOT DETECTED Final  C difficile quick scan w PCR reflex     Status: None   Collection Time: 06/03/16 10:48 PM  Result Value Ref Range Status   C Diff antigen NEGATIVE NEGATIVE Final   C Diff toxin NEGATIVE NEGATIVE Final   C Diff interpretation No C. difficile detected.  Final  MRSA PCR Screening     Status: None   Collection Time: 06/04/16  3:03 AM  Result Value Ref Range Status   MRSA by PCR NEGATIVE NEGATIVE Final    Comment:  The GeneXpert MRSA Assay (FDA approved for NASAL specimens only), is one component of a comprehensive MRSA colonization surveillance program. It is not intended to diagnose MRSA infection nor to guide or monitor treatment for MRSA infections.   Culture, blood (routine x 2)     Status: None (Preliminary result)   Collection Time: 06/04/16  9:30 AM  Result Value Ref Range Status   Specimen Description BLOOD BLOOD RIGHT HAND  Final   Special Requests BOTTLES DRAWN AEROBIC AND ANAEROBIC 6CC  Final   Culture NO GROWTH < 24 HOURS  Final   Report Status PENDING  Incomplete  Culture, blood (routine x 2)     Status: None (Preliminary result)   Collection Time: 06/04/16 10:33 AM  Result Value Ref Range Status   Specimen Description BLOOD BLOOD RIGHT HAND  Final   Special Requests BOTTLES DRAWN AEROBIC AND ANAEROBIC 6CC  Final   Culture NO GROWTH < 24 HOURS  Final   Report Status PENDING  Incomplete         Radiology Studies: Ct Abdomen Pelvis W Contrast  Result Date: 06/03/2016 CLINICAL DATA:  Nausea and vomiting x2 with diarrhea. Treated for recent pneumonia last week. Near syncope this evening. EXAM: CT ABDOMEN AND PELVIS WITH CONTRAST TECHNIQUE: Multidetector CT imaging of the abdomen and pelvis was performed using the standard protocol following bolus administration of intravenous contrast. CONTRAST:  ISOVUE-300  IOPAMIDOL (ISOVUE-300) INJECTION 61% COMPARISON:  CT report from 01/25/2011 abdomen and pelvis FINDINGS: Lower chest: Heart is normal in size. Bibasilar faint airspace opacities in the right middle lobe, lingula and left lower lobe are identified in keeping with reported pneumonia. Hepatobiliary: Moderate distention of the gallbladder without stones or wall thickening. Findings could be due to a fasting state. No space-occupying mass of liver. Mild intrahepatic biliary dilatation may be secondary to the distended gallbladder. Pancreas: Negative Spleen: Normal Adrenals/Urinary Tract: Normal bilateral adrenal glands. 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 No obstructive uropathy. Stomach/Bowel: Transmural inflammatory or infectious thickening of the transverse colon through rectum consistent with proctocolitis. No small bowel dilatation nor obstruction. The appendix is not visualized but no definite pericecal inflammation. Vascular/Lymphatic: Aortic atherosclerosis. No enlarged abdominal or pelvic lymph nodes. Reproductive: Status post hysterectomy. No adnexal masses. Other: No abdominal wall hernia or abnormality. No abdominopelvic ascites. Musculoskeletal: No acute or significant osseous findings. IMPRESSION: 1. Transmural thickening of transverse colon through rectum consistent with a proctocolitis likely postinfectious or postinflammatory. 2. Patchy bibasilar airspace opacities in the right middle lobe, lingula and left lower lobe consistent with reported history of recent pneumonia. Findings may reflect appears residual pneumonia. 3. Right nephrectomy. 4. Aortic atherosclerosis. Electronically Signed   By: Tollie Eth M.D.   On: 06/03/2016 23:51   Dg Chest Port 1 View  Result Date: 06/03/2016 CLINICAL DATA:  Cough and syncope today. EXAM: PORTABLE CHEST 1 VIEW COMPARISON:  05/18/2016 and prior radiographs FINDINGS: The cardiomediastinal silhouette is unremarkable.  There is no evidence of focal airspace disease, pulmonary edema, suspicious pulmonary nodule/mass, pleural effusion, or pneumothorax. No acute bony abnormalities are identified. IMPRESSION: No active disease. Electronically Signed   By: Harmon Pier M.D.   On: 06/03/2016 20:39        Scheduled Meds: . ALPRAZolam  1 mg Oral QID  . guaiFENesin  1,200 mg Oral BID  . ipratropium-albuterol  3 mL Nebulization Q6H WA  . mometasone-formoterol  2 puff Inhalation BID  . nicotine  21 mg Transdermal Daily  .  piperacillin-tazobactam (ZOSYN)  IV  3.375 g Intravenous Q8H  . sertraline  200 mg Oral Daily   Continuous Infusions: . dextrose 5 % 1,000 mL with potassium chloride 20 mEq, sodium bicarbonate 150 mEq infusion 100 mL/hr at 06/05/16 0800     LOS: 1 day    Time spent: 25 mins    Daeshawn Redmann, MD [facility] Pager [phone]  If 7PM-7AM, please contact night-coverage [email or web address] Password [facility] 06/05/2016, 9:16 AM

## 2016-06-06 DIAGNOSIS — D696 Thrombocytopenia, unspecified: Secondary | ICD-10-CM

## 2016-06-06 LAB — CBC
HEMATOCRIT: 32.6 % — AB (ref 36.0–46.0)
Hemoglobin: 10.7 g/dL — ABNORMAL LOW (ref 12.0–15.0)
MCH: 29.7 pg (ref 26.0–34.0)
MCHC: 32.8 g/dL (ref 30.0–36.0)
MCV: 90.6 fL (ref 78.0–100.0)
PLATELETS: 97 10*3/uL — AB (ref 150–400)
RBC: 3.6 MIL/uL — ABNORMAL LOW (ref 3.87–5.11)
RDW: 14.8 % (ref 11.5–15.5)
WBC: 4.8 10*3/uL (ref 4.0–10.5)

## 2016-06-06 LAB — BASIC METABOLIC PANEL
ANION GAP: 6 (ref 5–15)
BUN: <5 mg/dL — ABNORMAL LOW (ref 6–20)
CALCIUM: 7.8 mg/dL — AB (ref 8.9–10.3)
CO2: 25 mmol/L (ref 22–32)
CREATININE: 0.64 mg/dL (ref 0.44–1.00)
Chloride: 104 mmol/L (ref 101–111)
Glucose, Bld: 96 mg/dL (ref 65–99)
Potassium: 3.7 mmol/L (ref 3.5–5.1)
SODIUM: 135 mmol/L (ref 135–145)

## 2016-06-06 MED ORDER — NICOTINE 21 MG/24HR TD PT24: 21.0000 mg | MEDICATED_PATCH | Freq: Every day | TRANSDERMAL | 0 refills | Status: DC

## 2016-06-06 MED ORDER — AMOXICILLIN-POT CLAVULANATE 875-125 MG PO TABS: 1.0000 | ORAL_TABLET | Freq: Two times a day (BID) | ORAL | 0 refills | Status: DC

## 2016-06-06 NOTE — Discharge Summary (Signed)
Physician Discharge Summary  LAURELAI LEPP UJW:[id number] DOB: Feb 25, 1961 DOA: 06/03/2016  PCP: Josue Hector, MD  Admit date: 06/03/2016 Discharge date: 06/06/2016  Admitted From: Home Disposition:  Home  Recommendations for Outpatient Follow-up:  1. Follow up with PCP in 1-2 weeks 2. Please obtain BMP/CBC in one week 3. Patient has been scheduled with GI on 4/24 to be evaluated for colonoscopy  Home Health: Equipment/Devices:  Discharge Condition: stable CODE STATUS: full code Diet recommendation: Heart Healthy   Brief/Interim Summary: 56 year old female with history of COPD, presents to the hospital with vomiting and diarrhea. Found to have colitis on CT scan. She was also hypotensive on arrival . admitted to the hospital and started on IV fluids and antibiotics.  Discharge Diagnoses:  Active Problems:   CAP (community acquired pneumonia)   Nausea vomiting and diarrhea   Colitis   Hypokalemia   Hypotension   Thrombocytopenia (HCC)  1. Colitis. Etiology is unclear, although suspect infectious. C diff and GI pathogen panel is negative. She was having fevers, but has since resolved. She was treated with intravenous Zosyn and will be transitioned to Augmentin to complete her antibiotic course. Overall she feels diarrhea has improved. Continue to monitor. She has been referred to GI for colonoscopy as an outpatient.  2. Hypotension. Initially felt to be related to dehydration and volume depletion. She was aggressively hydrated with IV fluid. Patient's last blood pressures in the 90s and occasionally in the 80s. It appears that she may have chronic hypertension. She does not appear to be symptomatic with these numbers. She is able to ambulate without feeling dizzy, does not have any lightheadedness or chest pain. She is urinating frequently. Cortisol level is normal. This will need to be further followed up as an outpatient.  3. CAP. Imagining indicates possible right sided  pneumonia. She is on antibiotics. Respiratory status appears stable.   4. Rectal bleeding. Suspect this is related to colitis. Hemoglobin stable and she has not had further bleeding.   5. Depression. Continue zoloft  6. Thrombocytopenia. Suspect reactive to underlying acute process. Repeat CBC in 1 week. She does not appear septic or toxic at this time.  Discharge Instructions  Discharge Instructions    Diet - low sodium heart healthy    Complete by:  As directed    Increase activity slowly    Complete by:  As directed      Allergies as of 06/06/2016      Reactions   Nsaids Other (See Comments)   Kidney condition   Sulfa Antibiotics Nausea And Vomiting      Medication List    TAKE these medications   acetaminophen 500 MG tablet Commonly known as:  TYLENOL Take 1,000 mg by mouth every 6 (six) hours as needed.   albuterol 108 (90 Base) MCG/ACT inhaler Commonly known as:  PROVENTIL HFA;VENTOLIN HFA Inhale into the lungs.   ALPRAZolam 1 MG tablet Commonly known as:  XANAX Take 1 tablet (1 mg total) by mouth 4 (four) times daily.   amoxicillin-clavulanate 875-125 MG tablet Commonly known as:  AUGMENTIN Take 1 tablet by mouth 2 (two) times daily.   budesonide-formoterol 160-4.5 MCG/ACT inhaler Commonly known as:  SYMBICORT Inhale 2 puffs into the lungs 2 (two) times daily.   denosumab 60 MG/ML Soln injection Commonly known as:  PROLIA Inject 60 mg into the skin every 6 (six) months. Administer in upper arm, thigh, or abdomen   nicotine 21 mg/24hr patch Commonly known as:  NICODERM CQ -  dosed in mg/24 hours Place 1 patch (21 mg total) onto the skin daily. Start taking on:  06/07/2016   sertraline 100 MG tablet Commonly known as:  ZOLOFT Take 2 tablets (200 mg total) by mouth daily.      Follow-up Information    Tana Coast, PA-C Follow up on 07/18/2016.   Specialty:  Gastroenterology Why:  8am Contact information: [address] [place] [address] [phone]        Josue Hector, MD. Schedule an appointment as soon as possible for a visit in 1 week(s).   Specialty:  Family Medicine Contact information: [address] [place] [address] [phone]          Allergies  Allergen Reactions  . Nsaids Other (See Comments)    Kidney condition  . Sulfa Antibiotics Nausea And Vomiting    Consultations:     Procedures/Studies: Dg Chest 2 View  Result Date: 05/18/2016 CLINICAL DATA:  Dry cough over the last 3 weeks. EXAM: CHEST  2 VIEW COMPARISON:  10/23/2012 FINDINGS: Heart size is normal. Mediastinal shadows are normal. The right lung is clear. There is mild patchy infiltrate in the lingula that was not seen on the previous study consistent with mild pneumonia. No effusions. No bony abnormalities. IMPRESSION: Mild patchy infiltrate in the lingula not seen previously. Consistent with mild bronchopneumonia. Radiographic follow-up to resolution suggested. Electronically Signed   By: Paulina Fusi M.D.   On: 05/18/2016 10:04   Ct Abdomen Pelvis W Contrast  Result Date: 06/03/2016 CLINICAL DATA:  Nausea and vomiting x2 with diarrhea. Treated for recent pneumonia last week. Near syncope this evening. EXAM: CT ABDOMEN AND PELVIS WITH CONTRAST TECHNIQUE: Multidetector CT imaging of the abdomen and pelvis was performed using the standard protocol following bolus administration of intravenous contrast. CONTRAST:  ISOVUE-300 IOPAMIDOL (ISOVUE-300) INJECTION 61% COMPARISON:  CT report from 01/25/2011 abdomen and pelvis FINDINGS: Lower chest: Heart is normal in size. Bibasilar faint airspace opacities in the right middle lobe, lingula and left lower lobe are identified in keeping with reported pneumonia. Hepatobiliary: Moderate distention of the gallbladder without stones or wall thickening. Findings could be due to a fasting state. No space-occupying mass of liver. Mild intrahepatic biliary  dilatation may be secondary to the distended gallbladder. Pancreas: Negative Spleen: Normal Adrenals/Urinary Tract: Normal bilateral adrenal glands. 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 No obstructive uropathy. Stomach/Bowel: Transmural inflammatory or infectious thickening of the transverse colon through rectum consistent with proctocolitis. No small bowel dilatation nor obstruction. The appendix is not visualized but no definite pericecal inflammation. Vascular/Lymphatic: Aortic atherosclerosis. No enlarged abdominal or pelvic lymph nodes. Reproductive: Status post hysterectomy. No adnexal masses. Other: No abdominal wall hernia or abnormality. No abdominopelvic ascites. Musculoskeletal: No acute or significant osseous findings. IMPRESSION: 1. Transmural thickening of transverse colon through rectum consistent with a proctocolitis likely postinfectious or postinflammatory. 2. Patchy bibasilar airspace opacities in the right middle lobe, lingula and left lower lobe consistent with reported history of recent pneumonia. Findings may reflect appears residual pneumonia. 3. Right nephrectomy. 4. Aortic atherosclerosis. Electronically Signed   By: Tollie Eth M.D.   On: 06/03/2016 23:51   Dg Chest Port 1 View  Result Date: 06/03/2016 CLINICAL DATA:  Cough and syncope today. EXAM: PORTABLE CHEST 1 VIEW COMPARISON:  05/18/2016 and prior radiographs FINDINGS: The cardiomediastinal silhouette is unremarkable. There is no evidence of focal airspace disease, pulmonary edema, suspicious pulmonary nodule/mass, pleural effusion,  or pneumothorax. No acute bony abnormalities are identified. IMPRESSION: No active disease. Electronically Signed   By: Harmon Pier M.D.   On: 06/03/2016 20:39       Subjective: Diarrhea improving. No vomiting. No abdominal pain. No dizziness. Feeling better. Very insistent on going home today  Discharge Exam: Vitals:   06/06/16 0900 06/06/16 1200   BP:    Pulse: 91   Resp: (!) 23   Temp:  98.3 F (36.8 C)   Vitals:   06/06/16 0806 06/06/16 0900 06/06/16 1200 06/06/16 1435  BP:      Pulse:  91    Resp:  (!) 23    Temp: 99 F (37.2 C)  98.3 F (36.8 C)   TempSrc: Oral  Oral   SpO2: 97% 96%  98%  Weight:      Height:        General: Pt is alert, awake, not in acute distress Cardiovascular: RRR, S1/S2 +, no rubs, no gallops Respiratory: CTA bilaterally, no wheezing, no rhonchi Abdominal: Soft, NT, ND, bowel sounds + Extremities: no edema, no cyanosis    The results of significant diagnostics from this hospitalization (including imaging, microbiology, ancillary and laboratory) are listed below for reference.     Microbiology: Recent Results (from the past 240 hour(s))  Urine culture     Status: None   Collection Time: 06/03/16  8:37 PM  Result Value Ref Range Status   Specimen Description URINE, CATHETERIZED  Final   Special Requests NONE  Final   Culture   Final    NO GROWTH Performed at [facility], [address] [place], [place] [address]    Report Status 06/05/2016 FINAL  Final  Gastrointestinal Panel by PCR , Stool     Status: None   Collection Time: 06/03/16 10:48 PM  Result Value Ref Range Status   Campylobacter species NOT DETECTED NOT DETECTED Final   Plesimonas shigelloides NOT DETECTED NOT DETECTED Final   Salmonella species NOT DETECTED NOT DETECTED Final   Yersinia enterocolitica NOT DETECTED NOT DETECTED Final   Vibrio species NOT DETECTED NOT DETECTED Final   Vibrio cholerae NOT DETECTED NOT DETECTED Final   Enteroaggregative E coli (EAEC) NOT DETECTED NOT DETECTED Final   Enteropathogenic E coli (EPEC) NOT DETECTED NOT DETECTED Final   Enterotoxigenic E coli (ETEC) NOT DETECTED NOT DETECTED Final   Shiga like toxin producing E coli (STEC) NOT DETECTED NOT DETECTED Final   Shigella/Enteroinvasive E coli (EIEC) NOT DETECTED NOT DETECTED Final   Cryptosporidium NOT DETECTED NOT  DETECTED Final   Cyclospora cayetanensis NOT DETECTED NOT DETECTED Final   Entamoeba histolytica NOT DETECTED NOT DETECTED Final   Giardia lamblia NOT DETECTED NOT DETECTED Final   Adenovirus F40/41 NOT DETECTED NOT DETECTED Final   Astrovirus NOT DETECTED NOT DETECTED Final   Norovirus GI/GII NOT DETECTED NOT DETECTED Final   Rotavirus A NOT DETECTED NOT DETECTED Final   Sapovirus (I, II, IV, and V) NOT DETECTED NOT DETECTED Final  C difficile quick scan w PCR reflex     Status: None   Collection Time: 06/03/16 10:48 PM  Result Value Ref Range Status   C Diff antigen NEGATIVE NEGATIVE Final   C Diff toxin NEGATIVE NEGATIVE Final   C Diff interpretation No C. difficile detected.  Final  MRSA PCR Screening     Status: None   Collection Time: 06/04/16  3:03 AM  Result Value Ref Range Status   MRSA by PCR NEGATIVE NEGATIVE  Final    Comment:        The GeneXpert MRSA Assay (FDA approved for NASAL specimens only), is one component of a comprehensive MRSA colonization surveillance program. It is not intended to diagnose MRSA infection nor to guide or monitor treatment for MRSA infections.   Culture, blood (routine x 2)     Status: None (Preliminary result)   Collection Time: 06/04/16  9:30 AM  Result Value Ref Range Status   Specimen Description BLOOD BLOOD RIGHT HAND  Final   Special Requests BOTTLES DRAWN AEROBIC AND ANAEROBIC 6CC  Final   Culture NO GROWTH 2 DAYS  Final   Report Status PENDING  Incomplete  Culture, blood (routine x 2)     Status: None (Preliminary result)   Collection Time: 06/04/16 10:33 AM  Result Value Ref Range Status   Specimen Description BLOOD BLOOD RIGHT HAND  Final   Special Requests BOTTLES DRAWN AEROBIC AND ANAEROBIC 6CC  Final   Culture NO GROWTH 2 DAYS  Final   Report Status PENDING  Incomplete     Labs: BNP (last 3 results) No results for input(s): BNP in the last 8760 hours. Basic Metabolic Panel:  Recent Labs Lab 06/03/16 2017  06/04/16 0517 06/05/16 0424 06/06/16 0506  NA 136 134* 134* 135  K 3.4* 4.0 3.4* 3.7  CL 105 109 99* 104  CO2 22 19* 30 25  GLUCOSE 115* 106* 121* 96  BUN 9 7 6  <5*  CREATININE 0.73 0.67 0.69 0.64  CALCIUM 8.2* 7.6* 7.7* 7.8*   Liver Function Tests:  Recent Labs Lab 06/03/16 2017 06/04/16 0517  AST 27 50*  ALT 11* 22  ALKPHOS 72 66  BILITOT 0.3 0.4  PROT 5.8* 5.2*  ALBUMIN 3.2* 2.8*    Recent Labs Lab 06/03/16 2017  LIPASE 16   No results for input(s): AMMONIA in the last 168 hours. CBC:  Recent Labs Lab 06/03/16 2017 06/04/16 0517 06/05/16 0424 06/06/16 0506  WBC 3.4* 3.3* 6.1 4.8  NEUTROABS 2.1  --   --   --   HGB 12.2 11.4* 11.1* 10.7*  HCT 36.9 34.4* 33.2* 32.6*  MCV 90.9 90.5 89.2 90.6  PLT 137* 116* 120* 97*   Cardiac Enzymes:  Recent Labs Lab 06/03/16 2017  TROPONINI <0.03   BNP: Invalid input(s): POCBNP CBG: No results for input(s): GLUCAP in the last 168 hours. D-Dimer No results for input(s): DDIMER in the last 72 hours. Hgb A1c No results for input(s): HGBA1C in the last 72 hours. Lipid Profile No results for input(s): CHOL, HDL, LDLCALC, TRIG, CHOLHDL, LDLDIRECT in the last 72 hours. Thyroid function studies No results for input(s): TSH, T4TOTAL, T3FREE, THYROIDAB in the last 72 hours.  Invalid input(s): FREET3 Anemia work up No results for input(s): VITAMINB12, FOLATE, FERRITIN, TIBC, IRON, RETICCTPCT in the last 72 hours. Urinalysis    Component Value Date/Time   COLORURINE AMBER (A) 06/03/2016 2037   APPEARANCEUR CLEAR 06/03/2016 2037   LABSPEC 1.035 (H) 06/03/2016 2037   PHURINE 5.0 06/03/2016 2037   GLUCOSEU NEGATIVE 06/03/2016 2037   HGBUR NEGATIVE 06/03/2016 2037   BILIRUBINUR NEGATIVE 06/03/2016 2037   KETONESUR 20 (A) 06/03/2016 2037   PROTEINUR NEGATIVE 06/03/2016 2037   UROBILINOGEN 0.2 04/01/2012 1503   NITRITE NEGATIVE 06/03/2016 2037   LEUKOCYTESUR NEGATIVE 06/03/2016 2037   Sepsis Labs Invalid input(s):  PROCALCITONIN,  WBC,  LACTICIDVEN Microbiology Recent Results (from the past 240 hour(s))  Urine culture     Status: None  Collection Time: 06/03/16  8:37 PM  Result Value Ref Range Status   Specimen Description URINE, CATHETERIZED  Final   Special Requests NONE  Final   Culture   Final    NO GROWTH Performed at [facility], [address], [place], [place] [address]    Report Status 06/05/2016 FINAL  Final  Gastrointestinal Panel by PCR , Stool     Status: None   Collection Time: 06/03/16 10:48 PM  Result Value Ref Range Status   Campylobacter species NOT DETECTED NOT DETECTED Final   Plesimonas shigelloides NOT DETECTED NOT DETECTED Final   Salmonella species NOT DETECTED NOT DETECTED Final   Yersinia enterocolitica NOT DETECTED NOT DETECTED Final   Vibrio species NOT DETECTED NOT DETECTED Final   Vibrio cholerae NOT DETECTED NOT DETECTED Final   Enteroaggregative E coli (EAEC) NOT DETECTED NOT DETECTED Final   Enteropathogenic E coli (EPEC) NOT DETECTED NOT DETECTED Final   Enterotoxigenic E coli (ETEC) NOT DETECTED NOT DETECTED Final   Shiga like toxin producing E coli (STEC) NOT DETECTED NOT DETECTED Final   Shigella/Enteroinvasive E coli (EIEC) NOT DETECTED NOT DETECTED Final   Cryptosporidium NOT DETECTED NOT DETECTED Final   Cyclospora cayetanensis NOT DETECTED NOT DETECTED Final   Entamoeba histolytica NOT DETECTED NOT DETECTED Final   Giardia lamblia NOT DETECTED NOT DETECTED Final   Adenovirus F40/41 NOT DETECTED NOT DETECTED Final   Astrovirus NOT DETECTED NOT DETECTED Final   Norovirus GI/GII NOT DETECTED NOT DETECTED Final   Rotavirus A NOT DETECTED NOT DETECTED Final   Sapovirus (I, II, IV, and V) NOT DETECTED NOT DETECTED Final  C difficile quick scan w PCR reflex     Status: None   Collection Time: 06/03/16 10:48 PM  Result Value Ref Range Status   C Diff antigen NEGATIVE NEGATIVE Final   C Diff toxin NEGATIVE NEGATIVE Final   C Diff interpretation No  C. difficile detected.  Final  MRSA PCR Screening     Status: None   Collection Time: 06/04/16  3:03 AM  Result Value Ref Range Status   MRSA by PCR NEGATIVE NEGATIVE Final    Comment:        The GeneXpert MRSA Assay (FDA approved for NASAL specimens only), is one component of a comprehensive MRSA colonization surveillance program. It is not intended to diagnose MRSA infection nor to guide or monitor treatment for MRSA infections.   Culture, blood (routine x 2)     Status: None (Preliminary result)   Collection Time: 06/04/16  9:30 AM  Result Value Ref Range Status   Specimen Description BLOOD BLOOD RIGHT HAND  Final   Special Requests BOTTLES DRAWN AEROBIC AND ANAEROBIC 6CC  Final   Culture NO GROWTH 2 DAYS  Final   Report Status PENDING  Incomplete  Culture, blood (routine x 2)     Status: None (Preliminary result)   Collection Time: 06/04/16 10:33 AM  Result Value Ref Range Status   Specimen Description BLOOD BLOOD RIGHT HAND  Final   Special Requests BOTTLES DRAWN AEROBIC AND ANAEROBIC 6CC  Final   Culture NO GROWTH 2 DAYS  Final   Report Status PENDING  Incomplete     Time coordinating discharge: Over 30 minutes  SIGNED:   Erick Blinks, MD  [facility] 06/06/2016, 4:15 PM Pager   If 7PM-7AM, please contact night-coverage [email or web address] Password [email or web address]

## 2016-06-06 NOTE — Progress Notes (Addendum)
Pt walked around ICU x2 /38500ft w/o difficulty. While walking BP =81/61 HR stable. Pt brought back to chair BP=99/51  HR 78 sat 97 on R/A

## 2016-06-06 NOTE — Progress Notes (Signed)
Pt IV dc'd   AVS given  & discussed. Pt transported to awaiting transportation via wheelchair.

## 2016-06-08 ENCOUNTER — Telehealth (HOSPITAL_COMMUNITY): Payer: Self-pay | Admitting: *Deleted

## 2016-06-08 NOTE — Telephone Encounter (Signed)
spoke with patient today regarding appointment.   She has been discharged from hospital.   She had infection in her intestines and will be going through more test.   She will call back to schedule appointment with Peggy.

## 2016-06-09 LAB — CULTURE, BLOOD (ROUTINE X 2)
Culture: NO GROWTH
Culture: NO GROWTH

## 2016-06-12 ENCOUNTER — Ambulatory Visit (HOSPITAL_COMMUNITY): Payer: Self-pay | Admitting: Psychiatry

## 2016-06-13 DIAGNOSIS — F17210 Nicotine dependence, cigarettes, uncomplicated: Secondary | ICD-10-CM | POA: Insufficient documentation

## 2016-06-13 DIAGNOSIS — Z23 Encounter for immunization: Secondary | ICD-10-CM | POA: Insufficient documentation

## 2016-06-21 ENCOUNTER — Ambulatory Visit (INDEPENDENT_AMBULATORY_CARE_PROVIDER_SITE_OTHER): Payer: Medicaid Other | Admitting: Psychiatry

## 2016-06-21 DIAGNOSIS — F411 Generalized anxiety disorder: Secondary | ICD-10-CM | POA: Diagnosis not present

## 2016-06-21 DIAGNOSIS — F331 Major depressive disorder, recurrent, moderate: Secondary | ICD-10-CM | POA: Diagnosis not present

## 2016-06-21 NOTE — Progress Notes (Signed)
              THERAPIST PROGRESS NOTE  Session Time:     Wednesday 06/21/2016 3:20 PM - 4:03 PM   Participation Level: Active     Behavioral Response: CasualAlertAnxious/very talkative/improved mood  Type of Therapy: Individual Therapy   Treatment Goals:     1. Learn and implement calming and coping strategies to reduce anxiety and emotional anger outbursts.             2. Identify, challenge, and replace negative fearful self-talk that contributes to depression and anxiety     3. Process and resolve grief and loss issues related to husband's death.  Treatment Goals addressed:  1  Interventions: CBT and Supportive  Summary: Heather Wang is a 56 y.o. female  who is referred for services by psychiatrist Dr. Tenny Crawoss to improve coping skills. Patient presents with a long standing history of symptoms of depression and anxiety since early childhood. She reports numerous hospitalizations from age 89 to age 89 due to a nonfunctioning right kidney. She eventually had surgery to have kidney remove. She also reports both mother and grandmother suffered from symptoms of anxiety and states learning some of her behaviors from them because they were always sick and worried. She says she has always had problems coping with daily life. She suffers from frequent panic attacks.   Patient last was seen about 2- 3 weeks ago. She reports increased stress and anxiety related to having to go to ER and being hospitalized for about 3 days due to an infection in her intestines and hypotension.  She has continued to worry about her health since discharge. She is scheduled for follow up appointments in April 2018. She also reports continued stress related to ongoing substance abuse issues with her daughters. She continues to worry about granddaughter but does report they have been able to discuss some issues.    Suicidal/Homicidal: No  Therapist Response: Reviewed symptoms, identified stressors, facilitated  expression of thoughts and feelings, discussed coping statements,   Plan: Return again in 2 -3 weeks.   Diagnosis: Axis I: MDD. GAD    Axis II: Deferred    Scout Gumbs, LCSW 06/21/2016

## 2016-07-14 ENCOUNTER — Telehealth (HOSPITAL_COMMUNITY): Payer: Self-pay | Admitting: *Deleted

## 2016-07-14 NOTE — Telephone Encounter (Signed)
returned phone call to patient, appointment scheduled for 07/17/16 has been cancelled as requested.

## 2016-07-17 ENCOUNTER — Ambulatory Visit (HOSPITAL_COMMUNITY): Payer: Self-pay | Admitting: Psychiatry

## 2016-07-18 ENCOUNTER — Ambulatory Visit: Payer: Self-pay | Admitting: Gastroenterology

## 2016-07-18 ENCOUNTER — Ambulatory Visit: Payer: Medicaid Other | Admitting: Gastroenterology

## 2016-07-21 ENCOUNTER — Ambulatory Visit (INDEPENDENT_AMBULATORY_CARE_PROVIDER_SITE_OTHER): Payer: Medicaid Other | Admitting: Psychiatry

## 2016-07-21 ENCOUNTER — Encounter (HOSPITAL_COMMUNITY): Payer: Self-pay | Admitting: Psychiatry

## 2016-07-21 VITALS — BP 121/60 | HR 60 | Ht 64.0 in | Wt 104.2 lb

## 2016-07-21 DIAGNOSIS — F411 Generalized anxiety disorder: Secondary | ICD-10-CM | POA: Diagnosis not present

## 2016-07-21 DIAGNOSIS — Z79899 Other long term (current) drug therapy: Secondary | ICD-10-CM

## 2016-07-21 DIAGNOSIS — F331 Major depressive disorder, recurrent, moderate: Secondary | ICD-10-CM | POA: Diagnosis not present

## 2016-07-21 DIAGNOSIS — Z818 Family history of other mental and behavioral disorders: Secondary | ICD-10-CM

## 2016-07-21 MED ORDER — ALPRAZOLAM 1 MG PO TABS: 1.0000 mg | ORAL_TABLET | Freq: Four times a day (QID) | ORAL | 2 refills | Status: DC

## 2016-07-21 MED ORDER — SERTRALINE HCL 100 MG PO TABS: 200.0000 mg | ORAL_TABLET | Freq: Every day | ORAL | 2 refills | Status: DC
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
## 2016-07-26 ENCOUNTER — Ambulatory Visit (INDEPENDENT_AMBULATORY_CARE_PROVIDER_SITE_OTHER): Payer: Medicaid Other | Admitting: Psychiatry

## 2016-07-26 ENCOUNTER — Encounter (HOSPITAL_COMMUNITY): Payer: Self-pay | Admitting: Psychiatry

## 2016-07-26 DIAGNOSIS — F331 Major depressive disorder, recurrent, moderate: Secondary | ICD-10-CM

## 2016-07-26 DIAGNOSIS — F411 Generalized anxiety disorder: Secondary | ICD-10-CM

## 2016-07-26 NOTE — Progress Notes (Signed)
              THERAPIST PROGRESS NOTE  Session Time:     Wednesday 07/25/2016  2:15 PM - 3:05 PM  Participation Level: Active     Behavioral Response: CasualAlertAnxious/very talkative/improved mood  Type of Therapy: Individual Therapy   Treatment Goals:     1. Learn and implement calming and coping strategies to reduce anxiety and emotional anger outbursts.             2. Identify, challenge, and replace negative fearful self-talk that contributes to depression and anxiety     3. Process and resolve grief and loss issues related to husband's death.  Treatment Goals addressed:  1,2  Interventions: CBT and Supportive  Summary: Heather Wang is a 56 y.o. female  who is referred for services by psychiatrist Dr. Tenny Craw to improve coping skills. Patient presents with a long standing history of symptoms of depression and anxiety since early childhood. She reports numerous hospitalizations from age 13 to age 23 due to a nonfunctioning right kidney. She eventually had surgery to have kidney remove. She also reports both mother and grandmother suffered from symptoms of anxiety and states learning some of her behaviors from them because they were always sick and worried. She says she has always had problems coping with daily life. She suffers from frequent panic attacks.   Patient last was seen about 2- 3 weeks ago. She reports increased stress and anxiety related to recently learning one of her daughters has engaged in another act of illegal behavior, financial issues related to covering expenses for her granddaughter, her granddaughter's father requesting to keep granddaughter during the school year to reduce child support payments, and ongoing health issues. Her main worry now is that granddaughter's father may influence granddaughter to stay with him and may have negative influence on granddaughter's life.  She also worries about going to medical appointment for colonoscopy as she has limited  social support. Patient reports intense anger, anxiety, excessive worry, and excessive sleeping. She says she tries to stay involved in activities.    Suicidal/Homicidal: No  Therapist Response: Reviewed symptoms, identified stressors, facilitated expression of thoughts and feelings, assisted patient identify pattern of negative spiraling thoughts and ways to intervene with distracting activities/estimating the probability of fears coming true/identifying the most likely outcome, assisted patient explore resources   Plan: Return again in 2 -3 weeks.   Diagnosis: Axis I: MDD. GAD    Axis II: Deferred    Noe Goyer, LCSW 07/26/2016

## 2016-07-31 ENCOUNTER — Ambulatory Visit: Payer: Medicaid Other | Admitting: Gastroenterology

## 2016-07-31 ENCOUNTER — Encounter: Payer: Self-pay | Admitting: Gastroenterology

## 2016-07-31 ENCOUNTER — Telehealth: Payer: Self-pay | Admitting: Gastroenterology

## 2016-07-31 NOTE — Telephone Encounter (Signed)
PT WAS A NO SHOW AND LETTER SENT  °

## 2016-08-29 ENCOUNTER — Encounter (HOSPITAL_COMMUNITY): Payer: Self-pay | Admitting: Psychiatry

## 2016-08-29 ENCOUNTER — Ambulatory Visit (INDEPENDENT_AMBULATORY_CARE_PROVIDER_SITE_OTHER): Payer: Medicaid Other | Admitting: Psychiatry

## 2016-08-29 DIAGNOSIS — F331 Major depressive disorder, recurrent, moderate: Secondary | ICD-10-CM | POA: Diagnosis not present

## 2016-08-29 DIAGNOSIS — F411 Generalized anxiety disorder: Secondary | ICD-10-CM

## 2016-08-29 NOTE — Progress Notes (Signed)
              THERAPIST PROGRESS NOTE  Session Time:     Tuesday 08/29/2016 11:25 AM -12:15 PM  Participation Level: Active     Behavioral Response: CasualAlertAnxious/very talkative/improved mood  Type of Therapy: Individual Therapy   Treatment Goals:     1. Learn and implement calming and coping strategies to reduce anxiety and emotional anger outbursts.             2. Identify, challenge, and replace negative fearful self-talk that contributes to depression and anxiety     3. Process and resolve grief and loss issues related to husband's death.  Treatment Goals addressed:  1,2  Interventions: CBT and Supportive  Summary: Heather Wang is a 56 y.o. female  who is referred for services by psychiatrist Dr. Tenny Crawoss to improve coping skills. Patient presents with a long standing history of symptoms of depression and anxiety since early childhood. She reports numerous hospitalizations from age 89 to age 89 due to a nonfunctioning right kidney. She eventually had surgery to have kidney remove. She also reports both mother and grandmother suffered from symptoms of anxiety and states learning some of her behaviors from them because they were always sick and worried. She says she has always had problems coping with daily life. She suffers from frequent panic attacks.   Patient last was seen about 2- 3 weeks ago. She reports increased depressed mood, poor motivation, excessive sleeping, fatigue, anxiety, excessive worry, and anger since last session. She reports continued stress related to ongoing issues with her children. She expresses fear on eventually getting phone call one of her children has overdosed. She also worries her granddaughter may follow the same path as her mother. She reports no longer worrying granddaughter's father may try to pursue custody.  She reports additional stress related to finances and house needing repairs. She expresses frustration she does have the money or the  physical health to do repairs. She reports being very angry and irritable. Today is her deceased husband's birthday. She reports anger issues and feelings of abandonment regarding husband.     Suicidal/Homicidal: No  Therapist Response: Reviewed symptoms,administered GAD-7, PHQ-9,  identified stressors, reviewed and revised treatment plan, facilitated expression of feelings, normalized feelings of anger as part of  grief process,  Plan: Return again in 2 -3 weeks.   Diagnosis: Axis I: MDD. GAD    Axis II: Deferred    Heather Olander, LCSW 08/29/2016

## 2016-09-14 ENCOUNTER — Encounter (HOSPITAL_COMMUNITY): Payer: Self-pay | Admitting: Psychiatry

## 2016-09-14 ENCOUNTER — Ambulatory Visit (INDEPENDENT_AMBULATORY_CARE_PROVIDER_SITE_OTHER): Payer: Medicaid Other | Admitting: Psychiatry

## 2016-09-14 DIAGNOSIS — F331 Major depressive disorder, recurrent, moderate: Secondary | ICD-10-CM | POA: Diagnosis not present

## 2016-09-14 DIAGNOSIS — F411 Generalized anxiety disorder: Secondary | ICD-10-CM
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
## 2016-09-26 ENCOUNTER — Ambulatory Visit (INDEPENDENT_AMBULATORY_CARE_PROVIDER_SITE_OTHER): Payer: Medicaid Other | Admitting: Psychiatry

## 2016-09-26 ENCOUNTER — Encounter (HOSPITAL_COMMUNITY): Payer: Self-pay | Admitting: Psychiatry

## 2016-09-26 DIAGNOSIS — F331 Major depressive disorder, recurrent, moderate: Secondary | ICD-10-CM

## 2016-09-26 DIAGNOSIS — F329 Major depressive disorder, single episode, unspecified: Secondary | ICD-10-CM | POA: Diagnosis not present

## 2016-09-26 DIAGNOSIS — F411 Generalized anxiety disorder: Secondary | ICD-10-CM | POA: Diagnosis not present

## 2016-09-26 NOTE — Progress Notes (Signed)
              THERAPIST PROGRESS NOTE  Session Time:     Tuesday 09/26/2016 11:20 AM -  12:15 PM    Participation Level: Active     Behavioral Response: CasualAlertAnxious/very talkative/tangentiality/depressed mood  Type of Therapy: Individual Therapy   Treatment Goals:     1. Learn and implement calming and coping strategies to reduce anxiety and emotional anger outbursts.        2. Learn and implement behavioral strategies to overcome depression     3. Process and resolve grief and loss issues related to husband's death.  Treatment Goals addressed:  1,2  Interventions: CBT and Supportive  Summary: Heather Wang is a 56 y.o. female  who is referred for services by psychiatrist Dr. Tenny Crawoss to improve coping skills. Patient presents with a long standing history of symptoms of depression and anxiety since early childhood. She reports numerous hospitalizations from age 89 to age 89 due to a nonfunctioning right kidney. She eventually had surgery to have kidney remove. She also reports both mother and grandmother suffered from symptoms of anxiety and states learning some of her behaviors from them because they were always sick and worried. She says she has always had problems coping with daily life. She suffers from frequent panic attacks.   Patient last was seen about 2- 3 weeks ago. She reports continued depressed mood, poor motivation, excessive sleeping, fatigue, anxiety, excessive worry, and anger since last session. She did not complete homework but did engage in more activities and increased her efforts weren't taking care of responsibilities. She admits feeling a little less depressed after engaging in activity. She reports difficulty going out and being around people as she states just not wanting to be around people as she fears she will go off. She reports continued anxiety and worry. Patient fears she may lose her home as her daughter has not requirements of court  orders. Patient used her home as collateral for her daughter's bond last year. Her other stressors continue to include ongoing issues with her other 2 children, concerns about her granddaughter, and financial issues.  .   Suicidal/Homicidal: No  Therapist Response: Reviewed symptoms, facilitated expression of feelings, praise and reinforced patient's increased involvement in activity, assisted patient identify effects on level of depression, reviewed  rationale for use of behavioral activation, assigned patient to continue to engage in activities  assisted patient to begin to identify activities, assisted patient explore possible options regarding bond issue regarding her home,   Plan: Return again in 2 -3 weeks.   Diagnosis: Axis I: MDD. GAD    Axis II: Deferred    Heather Hanna, LCSW 09/26/2016

## 2016-10-12 ENCOUNTER — Ambulatory Visit (INDEPENDENT_AMBULATORY_CARE_PROVIDER_SITE_OTHER): Payer: Medicaid Other | Admitting: Psychiatry

## 2016-10-12 ENCOUNTER — Encounter (HOSPITAL_COMMUNITY): Payer: Self-pay | Admitting: Psychiatry

## 2016-10-12 DIAGNOSIS — F411 Generalized anxiety disorder: Secondary | ICD-10-CM

## 2016-10-12 DIAGNOSIS — F331 Major depressive disorder, recurrent, moderate: Secondary | ICD-10-CM | POA: Diagnosis not present

## 2016-10-12 NOTE — Progress Notes (Signed)
                    THERAPIST PROGRESS NOTE  Session Time:     Thursday 10/12/2016 11:08 AM - 12:00 PM  Participation Level: Active     Behavioral Response: CasualAlertAnxious/very talkative/tangentiality/depressed mood  Type of Therapy: Individual Therapy   Treatment Goals:     1. Learn and implement calming and coping strategies to reduce anxiety and emotional anger outbursts.        2. Learn and implement behavioral strategies to overcome depression     3. Process and resolve grief and loss issues related to husband's death.  Treatment Goals addressed:  1,2  Interventions: CBT and Supportive  Summary: Heather Wang is a 56 y.o. female  who is referred for services by psychiatrist Dr. Harrington Challenger to improve coping skills. Patient presents with a long standing history of symptoms of depression and anxiety since early childhood. She reports numerous hospitalizations from age 57 to age 45 due to a nonfunctioning right kidney. She eventually had surgery to have kidney remove. She also reports both mother and grandmother suffered from symptoms of anxiety and states learning some of her behaviors from them because they were always sick and worried. She says she has always had problems coping with daily life. She suffers from frequent panic attacks.   Patient last was seen about 2- 3 weeks ago. She reports continued depressed mood, poor motivation, excessive sleeping, fatigue, anxiety, excessive worry, and anger since last session. She continues toreport minimal involvement in activities. She is relieved she has been released from bond obligation regarding her house as daughter has met court requirements. Today, patient reports being in stressed by her brother going to the ER and being admitted to the hospital last night due to collapsing at his home. She reports his blood pressure is extremely low and says doctors are still performing more medical tests. Patient reports fearing  the worst and ruminating about this. She continues to have multiple stressors. Patient continues to have difficulty using healthy coping techniques and says she forgets her homework.   Suicidal/Homicidal: No  Therapist Response: Reviewed symptoms, facilitated expression of feelings, provided psychoeducation regarding anxiety response and techniques to trigger a relaxation response, assisted patient identify the way her body experiences anxiety, reviewed rationale for practicing deep breathing, assigned patient to review handouts provided in session on anxiety, deep breathing, assigned patient to practice deep breathing daily, assisted patient identify negative thought patterns regarding her brothers situation, assisted patient replace with more realistic healthy alternative,   Plan: Return again in 2 -3 weeks.   Diagnosis: Axis I: MDD. GAD    Axis II: Deferred    [name], LCSW 10/12/2016

## 2016-10-17 ENCOUNTER — Ambulatory Visit (INDEPENDENT_AMBULATORY_CARE_PROVIDER_SITE_OTHER): Payer: Medicaid Other | Admitting: Psychiatry

## 2016-10-17 ENCOUNTER — Encounter (HOSPITAL_COMMUNITY): Payer: Self-pay | Admitting: Psychiatry

## 2016-10-17 VITALS — BP 109/66 | HR 74 | Ht 64.0 in | Wt 107.4 lb

## 2016-10-17 DIAGNOSIS — F331 Major depressive disorder, recurrent, moderate: Secondary | ICD-10-CM | POA: Diagnosis not present

## 2016-10-17 DIAGNOSIS — F411 Generalized anxiety disorder: Secondary | ICD-10-CM | POA: Diagnosis not present

## 2016-10-17 DIAGNOSIS — Z818 Family history of other mental and behavioral disorders: Secondary | ICD-10-CM | POA: Diagnosis not present

## 2016-10-17 MED ORDER — SERTRALINE HCL 100 MG PO TABS
200.0000 mg | ORAL_TABLET | Freq: Every day | ORAL | 2 refills | Status: DC
Start: 2016-10-17 — End: 2017-01-22

## 2016-10-17 MED ORDER — ALPRAZOLAM 1 MG PO TABS
1.0000 mg | ORAL_TABLET | Freq: Four times a day (QID) | ORAL | 2 refills | Status: DC
Start: 2016-10-17 — End: 2017-01-30
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
## 2016-10-25 ENCOUNTER — Encounter (HOSPITAL_COMMUNITY): Payer: Self-pay | Admitting: Psychiatry

## 2016-10-25 ENCOUNTER — Ambulatory Visit (INDEPENDENT_AMBULATORY_CARE_PROVIDER_SITE_OTHER): Payer: Medicaid Other | Admitting: Psychiatry

## 2016-10-25 DIAGNOSIS — F411 Generalized anxiety disorder: Secondary | ICD-10-CM | POA: Diagnosis not present

## 2016-10-25 DIAGNOSIS — F331 Major depressive disorder, recurrent, moderate: Secondary | ICD-10-CM

## 2016-10-25 NOTE — Progress Notes (Signed)
                      THERAPIST PROGRESS NOTE  Session Time:    Wednesday 10/25/2016 3:06 PM -  4:00 PM  Participation Level: Active     Behavioral Response: CasualAlertAnxious/very talkative/tangentiality/irritable  Type of Therapy: Individual Therapy   Treatment Goals:     1. Learn and implement calming and coping strategies to reduce anxiety and emotional anger outbursts.        2. Learn and implement behavioral strategies to overcome depression     3. Process and resolve grief and loss issues related to husband's death.  Treatment Goals addressed:  1,2  Interventions: CBT and Supportive  Summary: Heather Wang is a 56 y.o. female  who is referred for services by psychiatrist Dr. Tenny Crawoss to improve coping skills. Patient presents with a long standing history of symptoms of depression and anxiety since early childhood. She reports numerous hospitalizations from age 89 to age 89 due to a nonfunctioning right kidney. She eventually had surgery to have kidney remove. She also reports both mother and grandmother suffered from symptoms of anxiety and states learning some of her behaviors from them because they were always sick and worried. She says she has always had problems coping with daily life. She suffers from frequent panic attacks.   Patient last was seen about 2- 3 weeks ago. She reports continued depressed mood, poor motivation, excessive sleeping, fatigue, anxiety, excessive worry, and anger since last session. She continues toreport minimal involvement in activities. She expresses less worry about brother. Se reports increased irritability along with being easily agitated and haven't increased anger outbursts. She continues to have multiple stressors and reports difficulty using healthy coping techniques.  Suicidal/Homicidal: No  Therapist Response: Reviewed symptoms, facilitated expression of feelings, discussed thoughts and processes that inhibit patient's  completion of homework, explored patient's motivation and readiness for change,  discussed anger and effects on patients's functioning and relationships, assisted patient identify effects of trauma history on her emotion regulation skills, discussed anger as a secondary emotion and explored patient's feelings beneath anger , assigned patient to identify possible effects/benefits of coping with anger in a healthy way  Plan: Return again in 2 -3 weeks.   Diagnosis: Axis I: MDD. GAD    Axis II: Deferred    Victorhugo Preis, LCSW 10/25/2016

## 2016-11-08 ENCOUNTER — Encounter (HOSPITAL_COMMUNITY): Payer: Self-pay | Admitting: Psychiatry

## 2016-11-08 ENCOUNTER — Ambulatory Visit (INDEPENDENT_AMBULATORY_CARE_PROVIDER_SITE_OTHER): Payer: Medicaid Other | Admitting: Psychiatry

## 2016-11-08 DIAGNOSIS — F331 Major depressive disorder, recurrent, moderate: Secondary | ICD-10-CM

## 2016-11-08 DIAGNOSIS — F411 Generalized anxiety disorder: Secondary | ICD-10-CM

## 2016-11-08 NOTE — Progress Notes (Addendum)
                      THERAPIST PROGRESS NOTE  Session Time:    Wednesday 8/15 2018 2:15 PM -  3:12 PM  Participation Level: Active     Behavioral Response: CasualAlertAnxious/very talkative/tangentiality/irritable/angry  Type of Therapy: Individual Therapy   Treatment Goals:     1. Learn and implement calming and coping strategies to reduce anxiety and emotional anger outbursts.        2. Learn and implement behavioral strategies to overcome depression     3. Process and resolve grief and loss issues related to husband's death.  Treatment Goals addressed:  1,2  Interventions: CBT and Supportive  Summary: Heather Wang is a 56 y.o. female  who is referred for services by psychiatrist Dr. Tenny Crawoss to improve coping skills. Patient presents with a long standing history of symptoms of depression and anxiety since early childhood. She reports numerous hospitalizations from age 89 to age 89 due to a nonfunctioning right kidney. She eventually had surgery to have kidney remove. She also reports both mother and grandmother suffered from symptoms of anxiety and states learning some of her behaviors from them because they were always sick and worried. She says she has always had problems coping with daily life. She suffers from frequent panic attacks.   Patient last was seen about 2- 3 weeks ago. She reports continued depressed mood, poor motivation, excessive sleeping, fatigue, anxiety, excessive worry, and anger since last session. She reports several anger outbursts. She continues to report minimal involvement in activities. She says she has been having increased thoughts and dreams about her trauma history.  She continues to have multiple stressors and reports difficulty using healthy coping techniques. She says she did not complete homework as she misplaced it.    Suicidal/Homicidal: No  Therapist Response: Reviewed symptoms, facilitated expression of feelings, discussed  thoughts and processes that inhibit patient's completion of homework, explored patient's motivation and readiness for change, explored with patient possible causes of lack of progress in treatment, discussed increasing focus on improving emotional awareness and regulation skills to address the impact of trauma history on patient's current functioning,   Plan: Return again in 2 -3 weeks.   Diagnosis: Axis I: MDD. GAD    Axis II: Deferred    Heather Whitsitt, LCSW 11/08/2016

## 2016-11-28 ENCOUNTER — Ambulatory Visit (HOSPITAL_COMMUNITY): Payer: Self-pay | Admitting: Psychiatry

## 2016-12-11 ENCOUNTER — Ambulatory Visit (HOSPITAL_COMMUNITY): Payer: Medicaid Other | Admitting: Psychiatry

## 2016-12-11 ENCOUNTER — Telehealth (HOSPITAL_COMMUNITY): Payer: Self-pay | Admitting: *Deleted

## 2016-12-11 NOTE — Telephone Encounter (Signed)
returned phone call, patient left voice message asking if we are here today.

## 2016-12-14 ENCOUNTER — Encounter (HOSPITAL_COMMUNITY): Payer: Self-pay | Admitting: Psychiatry

## 2016-12-14 ENCOUNTER — Ambulatory Visit (INDEPENDENT_AMBULATORY_CARE_PROVIDER_SITE_OTHER): Payer: Medicaid Other | Admitting: Psychiatry

## 2016-12-14 DIAGNOSIS — F411 Generalized anxiety disorder: Secondary | ICD-10-CM | POA: Diagnosis not present

## 2016-12-14 DIAGNOSIS — F331 Major depressive disorder, recurrent, moderate: Secondary | ICD-10-CM

## 2016-12-14 NOTE — Progress Notes (Signed)
                      THERAPIST PROGRESS NOTE       Session Time:    Thursday 12/14/2016 1:14 -2:05 PM  Participation Level: Active     Behavioral Response: CasualAlertAnxious/very talkative/tangentiality/irritable/angry  Type of Therapy: Individual Therapy   Treatment Goals:     1. Learn and implement calming and coping strategies to reduce anxiety and emotional anger outbursts.        2. Learn and implement behavioral strategies to overcome depression     3. Process and resolve grief and loss issues related to husband's death.  Treatment Goals addressed:  1  Interventions: CBT and Supportive  Summary: Heather Wang is a 56 y.o. female  who is referred for services by psychiatrist Dr. Tenny Craw to improve coping skills. Patient presents with a long standing history of symptoms of depression and anxiety since early childhood. She reports numerous hospitalizations from age 44 to age 68 due to a nonfunctioning right kidney. She eventually had surgery to have kidney remove. She also reports both mother and grandmother suffered from symptoms of anxiety and states learning some of her behaviors from them because they were always sick and worried. She says she has always had problems coping with daily life. She suffers from frequent panic attacks.   Patient last was seen about 4 weeks ago. She reports continued depressed mood, poor motivation, excessive sleeping, fatigue, anxiety, excessive worry, and anger since last session. She reports several anger outbursts. She continues to report minimal involvement in activities. She continues to have multiple stressors and reports continued difficulty using healthy coping techniques. She reports increased worry about her health as she has had problems regarding urinating.    Suicidal/Homicidal: No   Therapist Response: Reviewed symptoms, facilitated expression of feelings, discussed the concept of emotion regulation and the impact of  trauma on emotion regulation, assisted patient identify her emotion regulation difficulties and the effects on her mood and interpersonal relationships, provided patient with handout on emotion regulation and assigned her to review for next session.   Plan: Return again in 2 -3 weeks.   Diagnosis: Axis I: MDD. GAD    Axis II: Deferred    BYNUM,PEGGY, LCSW 12/14/2016

## 2017-01-02 DIAGNOSIS — Z1211 Encounter for screening for malignant neoplasm of colon: Secondary | ICD-10-CM | POA: Insufficient documentation

## 2017-01-08 ENCOUNTER — Ambulatory Visit (HOSPITAL_COMMUNITY): Payer: Medicaid Other | Admitting: Psychiatry

## 2017-01-16 ENCOUNTER — Ambulatory Visit (HOSPITAL_COMMUNITY): Payer: Self-pay | Admitting: Psychiatry

## 2017-01-22 ENCOUNTER — Ambulatory Visit (INDEPENDENT_AMBULATORY_CARE_PROVIDER_SITE_OTHER): Payer: Medicaid Other | Admitting: Psychiatry

## 2017-01-22 ENCOUNTER — Encounter (HOSPITAL_COMMUNITY): Payer: Self-pay | Admitting: Psychiatry

## 2017-01-22 ENCOUNTER — Other Ambulatory Visit (HOSPITAL_COMMUNITY): Payer: Self-pay | Admitting: Psychiatry

## 2017-01-22 DIAGNOSIS — F411 Generalized anxiety disorder: Secondary | ICD-10-CM | POA: Diagnosis not present

## 2017-01-22 DIAGNOSIS — F329 Major depressive disorder, single episode, unspecified: Secondary | ICD-10-CM | POA: Diagnosis not present

## 2017-01-22 DIAGNOSIS — F331 Major depressive disorder, recurrent, moderate: Secondary | ICD-10-CM
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
## 2017-01-23 ENCOUNTER — Other Ambulatory Visit (HOSPITAL_COMMUNITY): Payer: Self-pay | Admitting: Adult Health Nurse Practitioner

## 2017-01-23 ENCOUNTER — Ambulatory Visit (HOSPITAL_COMMUNITY)
Admission: RE | Admit: 2017-01-23 | Discharge: 2017-01-23 | Disposition: A | Discharge status: Home / Self Care | Payer: Medicaid Other | Source: Ambulatory Visit | Attending: Adult Health Nurse Practitioner | Admitting: Adult Health Nurse Practitioner

## 2017-01-23 DIAGNOSIS — J40 Bronchitis, not specified as acute or chronic: Secondary | ICD-10-CM | POA: Diagnosis not present

## 2017-01-23 DIAGNOSIS — J449 Chronic obstructive pulmonary disease, unspecified: Secondary | ICD-10-CM | POA: Insufficient documentation

## 2017-01-25 ENCOUNTER — Telehealth (HOSPITAL_COMMUNITY): Payer: Self-pay | Admitting: *Deleted

## 2017-01-25 ENCOUNTER — Other Ambulatory Visit (HOSPITAL_COMMUNITY): Payer: Self-pay | Admitting: Psychiatry

## 2017-01-25 ENCOUNTER — Ambulatory Visit (HOSPITAL_COMMUNITY): Payer: Self-pay | Admitting: Psychiatry

## 2017-01-25 NOTE — Telephone Encounter (Signed)
Opened in Error.

## 2017-01-30 ENCOUNTER — Encounter (HOSPITAL_COMMUNITY): Payer: Self-pay | Admitting: Psychiatry

## 2017-01-30 ENCOUNTER — Ambulatory Visit (HOSPITAL_COMMUNITY): Payer: Medicaid Other | Admitting: Psychiatry

## 2017-01-30 VITALS — BP 102/60 | HR 84 | Wt 105.0 lb

## 2017-01-30 DIAGNOSIS — Z560 Unemployment, unspecified: Secondary | ICD-10-CM | POA: Diagnosis not present

## 2017-01-30 DIAGNOSIS — R0602 Shortness of breath: Secondary | ICD-10-CM | POA: Diagnosis not present

## 2017-01-30 DIAGNOSIS — F411 Generalized anxiety disorder: Secondary | ICD-10-CM

## 2017-01-30 DIAGNOSIS — F17210 Nicotine dependence, cigarettes, uncomplicated: Secondary | ICD-10-CM | POA: Diagnosis not present

## 2017-01-30 DIAGNOSIS — Z818 Family history of other mental and behavioral disorders: Secondary | ICD-10-CM | POA: Diagnosis not present

## 2017-01-30 DIAGNOSIS — R05 Cough: Secondary | ICD-10-CM | POA: Diagnosis not present

## 2017-01-30 DIAGNOSIS — Z6379 Other stressful life events affecting family and household: Secondary | ICD-10-CM

## 2017-01-30 DIAGNOSIS — F331 Major depressive disorder, recurrent, moderate: Secondary | ICD-10-CM | POA: Diagnosis not present

## 2017-01-30 DIAGNOSIS — R450 Nervousness: Secondary | ICD-10-CM | POA: Diagnosis not present

## 2017-01-30 MED ORDER — ALPRAZOLAM 1 MG PO TABS: 1.0000 mg | ORAL_TABLET | Freq: Four times a day (QID) | ORAL | 2 refills | Status: DC

## 2017-01-30 MED ORDER — SERTRALINE HCL 100 MG PO TABS: 200.0000 mg | ORAL_TABLET | Freq: Every day | ORAL | 2 refills | Status: DC

## 2017-01-30 NOTE — Progress Notes (Signed)
BH MD/PA/NP OP Progress Note  01/30/2017 10:17 AM Heather Wang  MRN:  [id number]  Chief Complaint:  Chief Complaint    Anxiety; Follow-up     HPI:  this patient is a 56 year old widowed white female who lives with her 2637year-old granddaughter in [place]. She has 2 other daughters who live on their own. She's currently unemployed.  The patient was [place] by [facility] [place] for further evaluation of depression and anxiety.  The patient reports that she's had on some depression since childhood. She was sick all the time and was constantly getting recurrent kidney infections. 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 She states that her mother and grandmother were anxious and worried all the time and she learned some of the behaviors from them.  The patient [facility] and tried Zoloft in the past. She's never had psychiatric hospitalizations.  Currently she is taking Celexa and Xanax which is helped to some degree but she still stays quite anxious. Her husband is in the terminal stages of prostate and bladder cancer which has spread to his bones. He refuses chemotherapy but is getting radiation. She and her daughter are the main caregivers. She states that she feels sad and weepy at times but other times she gets angered with people easily. She's impulsive and is quick to say what she thinks to people and stores and hospitals etc. She has a lot of preformed opinions about other people. She states her energy is low she sometimes she has difficulty sleeping her appetite is variable. Sometimes she sees things that aren't there like an animal rushing through the yard or sometimes hears her deceased father's voice. He seemed to be more related to anxiety and misperception. She denies hallucinations or paranoia. She has never had any suicidal or homicidal ideation. She does not use drugs or  alcohol  Patient returns after 3 months.  For the most part she is doing okay although she is struggling with her granddaughter who is often disobedient.  Her mood has been stable and she denies symptoms of depression or suicidal ideation.  The Xanax continues to help with her anxiety.  She was recently diagnosed with pneumonia and has lost more weight and I urged her to start eating more and making sure she is drinking fluids  Visit Diagnosis:    ICD-10-CM   1. Major depressive disorder, recurrent episode, moderate (HCC) F33.1   2. Generalized anxiety disorder F41.1     Past Psychiatric History: No prior psychiatric hospitalizations, [facility]  Past Medical History:  Past Medical History:  Diagnosis Date  . Anxiety   . Chronic back pain   . COPD (chronic obstructive pulmonary disease) (HCC)    Diagnosed in August 2016  . Graves disease   . Osteopetrosis   . Panic attacks   . Pneumonia   . Thyroid disease     Past Surgical History:  Procedure Laterality Date  . NEPHRECTOMY     native   . TOTAL ABDOMINAL HYSTERECTOMY    . TUBAL LIGATION      Family Psychiatric History: See below  Family History:  Family History  Problem Relation Age of Onset  . Anxiety disorder Mother   . Depression Mother   . Heart defect Unknown        family history   . Cancer Unknown  family history   . Arthritis Unknown        family history   . Depression Brother   . Anxiety disorder Maternal Grandmother   . Depression Maternal Grandmother     Social History:  Social History   Socioeconomic History  . Marital status: Married    Spouse name: None  . Number of children: None  . Years of education: 11th   . Highest education level: None  Social Needs  . Financial resource strain: None  . Food insecurity - worry: None  . Food insecurity - inability: None  . Transportation needs - medical: None  . Transportation needs -  non-medical: None  Occupational History  . Occupation: unemployed   Tobacco Use  . Smoking status: Current Every Day Smoker    Packs/day: 1.00    Types: Cigarettes  . Smokeless tobacco: Never Used  Substance and Sexual Activity  . Alcohol use: No  . Drug use: No  . Sexual activity: No  Other Topics Concern  . None  Social History Narrative  . None    Allergies:  Allergies  Allergen Reactions  . Nsaids Other (See Comments)    Kidney condition  . Sulfa Antibiotics Nausea And Vomiting    Metabolic Disorder Labs: No results found for: HGBA1C, MPG No results found for: PROLACTIN No results found for: CHOL, TRIG, HDL, CHOLHDL, VLDL, LDLCALC No results found for: TSH  Therapeutic Level Labs: No results found for: LITHIUM No results found for: VALPROATE No components found for:  CBMZ  Current Medications: Current Outpatient Medications  Medication Sig Dispense Refill  . acetaminophen (TYLENOL) 500 MG tablet Take 1,000 mg by mouth every 6 (six) hours as needed.    . ALPRAZolam (XANAX) 1 MG tablet Take 1 tablet (1 mg total) 4 (four) times daily by mouth. 120 tablet 2  . budesonide-formoterol (SYMBICORT) 160-4.5 MCG/ACT inhaler Inhale 2 puffs into the lungs 2 (two) times daily.    Marland Kitchen denosumab (PROLIA) 60 MG/ML SOLN injection Inject 60 mg into the skin every 6 (six) months. Administer in upper arm, thigh, or abdomen    . sertraline (ZOLOFT) 100 MG tablet Take 2 tablets (200 mg total) daily by mouth. 60 tablet 2   No current facility-administered medications for this visit.      Musculoskeletal: Strength & Muscle Tone: within normal limits Gait & Station: normal Patient leans: N/A  Psychiatric Specialty Exam: Review of Systems  Constitutional: Positive for weight loss.  Respiratory: Positive for cough and shortness of breath.   Psychiatric/Behavioral: The patient is nervous/anxious.   All other systems reviewed and are negative.   Blood pressure 102/60, pulse 84,  weight 105 lb (47.6 kg).Body mass index is 18.02 kg/m.  General Appearance: Casual and Fairly Groomed  Eye Contact:  Good  Speech:  Clear and Coherent  Volume:  Normal  Mood:  Anxious  Affect:  Congruent  Thought Process:  Goal Directed  Orientation:  Full (Time, Place, and Person)  Thought Content: Rumination   Suicidal Thoughts:  No  Homicidal Thoughts:  No  Memory:  Immediate;   Good Recent;   Good Remote;   Fair  Judgement:  Fair  Insight:  Fair  Psychomotor Activity:  Normal  Concentration:  Concentration: Good and Attention Span: Good  Recall:  Good  Fund of Knowledge: Good  Language: Good  Akathisia:  No  Handed:  Right  AIMS (if indicated): not done  Assets:  Communication Skills Desire for Improvement Resilience Social Support  ADL's:  Intact  Cognition: WNL  Sleep:  Good   Screenings: GAD-7     Counsel[facility] from [facility] Counselor from 12/27/2015 in [facility] Counselor from 07/28/2015 in [facility]  Total GAD-7 Score  16  17  9     PHQ2-9     Counselor from 08/29/2016 in [facility] Counselor from 12/27/2015 in [facility] Counselor from 07/28/2015 in [facility]  PHQ-2 Total Score  6  6  1   PHQ-9 Total Score  18  16  No data       Assessment and Plan: This patient is a 56 year old white female with a history of depression and anxiety.  She has been compliant with her medications and feels that she is getting good result.  She will continue on Zoloft 200 mg daily for depression and anxiety and Xanax 1 mg 4 times daily for anxiety symptoms.  She will continue her counseling and return to see me in 3 months   Diannia RuderOSS, DEBORAH, MD 01/30/2017, 10:17 AM

## 2017-02-08 ENCOUNTER — Telehealth (HOSPITAL_COMMUNITY): Payer: Self-pay | Admitting: *Deleted

## 2017-02-08 NOTE — Telephone Encounter (Signed)
returned phone call to schedule appointment, no answer, left voice message.

## 2017-02-26 ENCOUNTER — Ambulatory Visit (HOSPITAL_COMMUNITY): Payer: Self-pay | Admitting: Psychiatry

## 2017-03-08 ENCOUNTER — Encounter: Payer: Self-pay | Admitting: Gastroenterology

## 2017-03-08 ENCOUNTER — Encounter: Payer: Self-pay | Admitting: Adult Health Nurse Practitioner

## 2017-03-26 ENCOUNTER — Telehealth (HOSPITAL_COMMUNITY): Payer: Self-pay | Admitting: *Deleted

## 2017-03-26 NOTE — Telephone Encounter (Signed)
left voice message, provider out of office 03/29/16.  please call to reschedule. 

## 2017-03-29 ENCOUNTER — Ambulatory Visit (HOSPITAL_COMMUNITY): Payer: Medicaid Other | Admitting: Psychiatry

## 2017-04-02 ENCOUNTER — Ambulatory Visit (INDEPENDENT_AMBULATORY_CARE_PROVIDER_SITE_OTHER): Payer: Medicaid Other | Admitting: Psychiatry

## 2017-04-02 ENCOUNTER — Encounter (HOSPITAL_COMMUNITY): Payer: Self-pay | Admitting: Psychiatry

## 2017-04-02 VITALS — BP 96/62 | HR 73 | Ht 64.0 in | Wt 106.0 lb

## 2017-04-02 DIAGNOSIS — Z818 Family history of other mental and behavioral disorders: Secondary | ICD-10-CM

## 2017-04-02 DIAGNOSIS — F17210 Nicotine dependence, cigarettes, uncomplicated: Secondary | ICD-10-CM | POA: Diagnosis not present

## 2017-04-02 DIAGNOSIS — Z560 Unemployment, unspecified: Secondary | ICD-10-CM

## 2017-04-02 DIAGNOSIS — F331 Major depressive disorder, recurrent, moderate: Secondary | ICD-10-CM

## 2017-04-02 DIAGNOSIS — F411 Generalized anxiety disorder: Secondary | ICD-10-CM | POA: Diagnosis not present

## 2017-04-02 DIAGNOSIS — R109 Unspecified abdominal pain: Secondary | ICD-10-CM

## 2017-04-02 MED ORDER — SERTRALINE HCL 100 MG PO TABS: 200.0000 mg | ORAL_TABLET | Freq: Every day | ORAL | 2 refills | Status: DC

## 2017-04-02 MED ORDER — ALPRAZOLAM 1 MG PO TABS: 1.0000 mg | ORAL_TABLET | Freq: Four times a day (QID) | ORAL | 2 refills | Status: DC

## 2017-04-02 NOTE — Progress Notes (Signed)
BH MD/PA/NP OP Progress Note  04/02/2017 1:33 PM Heather Wang  MRN:  [id number]  Chief Complaint:  Chief Complaint    Depression; Anxiety; Follow-up     HPI: this patient is a 57 year old widowed white female who lives with her 5264year-old granddaughter in [place]. She has 2 other daughters who live on their own. She's currently unemployed.  The patient was referred by [facility] in [place] for further evaluation of depression and anxiety.  The patient reports that she's had on some depression since childhood. She was sick all the time and was constantly getting recurrent kidney infections. 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 She states that her mother and grandmother were anxious and worried all the time and she learned some of the behaviors from them.  The patient used to [facility] and tried Zoloft in the past. She's never had psychiatric hospitalizations.  Currently she is taking Celexa and Xanax which is helped to some degree but she still stays quite anxious. Her husband is in the terminal stages of prostate and bladder cancer which has spread to his bones. He refuses chemotherapy but is getting radiation. She and her daughter are the main caregivers. She states that she feels sad and weepy at times but other times she gets angered with people easily. She's impulsive and is quick to say what she thinks to people and stores and hospitals etc. She has a lot of preformed opinions about other people. She states her energy is low she sometimes she has difficulty sleeping her appetite is variable. Sometimes she sees things that aren't there like an animal rushing through the yard or sometimes hears her deceased father's voice. He seemed to be more related to anxiety and misperception. She denies hallucinations or paranoia. She has never had any suicidal or homicidal ideation. She does not use drugs or  alcohol  Returns after 3 months.  She states that she is having a lot of stomach pain and is going to be seeing a GI specialist.  She has not lost any weight but is not really gaining either.  She still very thin.  She states that she has been anxious because her granddaughter has been so rude to her.  We have talked about this many times before and she needs to be firm and setting limits.  Her actual daughter recently got out of jail and started stealing again and probably will have to go back.  She realizes there is nothing she can do about this.  For the most part her mood is fairly good and her anxiety is well.  She is sleeping well.  She states that she still tends to speak out impulsively at times Visit Diagnosis:    ICD-10-CM   1. Major depressive disorder, recurrent episode, moderate (HCC) F33.1   2. Generalized anxiety disorder F41.1     Past Psychiatric History: none  Past Medical History:  Past Medical History:  Diagnosis Date  . Anxiety   . Chronic back pain   . COPD (chronic obstructive pulmonary disease) (HCC)    Diagnosed in August 2016  . Graves disease   . Osteopetrosis   . Panic attacks   . Pneumonia   . Thyroid disease     Past Surgical History:  Procedure Laterality Date  . NEPHRECTOMY     native   . TOTAL ABDOMINAL HYSTERECTOMY    . TUBAL LIGATION  Family Psychiatric History: See below  Family History:  Family History  Problem Relation Age of Onset  . Anxiety disorder Mother   . Depression Mother   . Heart defect Unknown        family history   . Cancer Unknown        family history   . Arthritis Unknown        family history   . Depression Brother   . Anxiety disorder Maternal Grandmother   . Depression Maternal Grandmother     Social History:  Social History   Socioeconomic History  . Marital status: Married    Spouse name: None  . Number of children: None  . Years of education: 11th   . Highest education level: None  Social Needs   . Financial resource strain: None  . Food insecurity - worry: None  . Food insecurity - inability: None  . Transportation needs - medical: None  . Transportation needs - non-medical: None  Occupational History  . Occupation: unemployed   Tobacco Use  . Smoking status: Current Every Day Smoker    Packs/day: 1.00    Types: Cigarettes  . Smokeless tobacco: Never Used  Substance and Sexual Activity  . Alcohol use: No  . Drug use: No  . Sexual activity: No  Other Topics Concern  . None  Social History Narrative  . None    Allergies:  Allergies  Allergen Reactions  . Nsaids Other (See Comments)    Kidney condition  . Sulfa Antibiotics Nausea And Vomiting    Metabolic Disorder Labs: No results found for: HGBA1C, MPG No results found for: PROLACTIN No results found for: CHOL, TRIG, HDL, CHOLHDL, VLDL, LDLCALC No results found for: TSH  Therapeutic Level Labs: No results found for: LITHIUM No results found for: VALPROATE No components found for:  CBMZ  Current Medications: Current Outpatient Medications  Medication Sig Dispense Refill  . acetaminophen (TYLENOL) 500 MG tablet Take 1,000 mg by mouth every 6 (six) hours as needed.    . ALPRAZolam (XANAX) 1 MG tablet Take 1 tablet (1 mg total) by mouth 4 (four) times daily. 120 tablet 2  . budesonide-formoterol (SYMBICORT) 160-4.5 MCG/ACT inhaler Inhale 2 puffs into the lungs 2 (two) times daily.    Marland Kitchen denosumab (PROLIA) 60 MG/ML SOLN injection Inject 60 mg into the skin every 6 (six) months. Administer in upper arm, thigh, or abdomen    . sertraline (ZOLOFT) 100 MG tablet Take 2 tablets (200 mg total) by mouth daily. 60 tablet 2   No current facility-administered medications for this visit.      Musculoskeletal: Strength & Muscle Tone: within normal limits Gait & Station: normal Patient leans: N/A  Psychiatric Specialty Exam: Review of Systems  Gastrointestinal: Positive for abdominal pain.  All other systems  reviewed and are negative.   Blood pressure 96/62, pulse 73, height 5\' 4"  (1.626 m), weight 106 lb (48.1 kg), SpO2 96 %.Body mass index is 18.19 kg/m.  General Appearance: Casual and Fairly Groomed  Eye Contact:  Good  Speech:  Clear and Coherent  Volume:  Normal  Mood:  Anxious  Affect:  Congruent  Thought Process:  Goal Directed  Orientation:  Full (Time, Place, and Person)  Thought Content: Rumination   Suicidal Thoughts:  No  Homicidal Thoughts:  No  Memory:  Immediate;   Good Recent;   Good Remote;   Good  Judgement:  Fair  Insight:  Fair  Psychomotor Activity:  Normal  Concentration:  Concentration: Fair and Attention Span: Fair  Recall:  Good  Fund of Knowledge: Good  Language: Good  Akathisia:  No  Handed:  Right  AIMS (if indicated): not done  Assets:  Communication Skills Desire for Improvement Resilience Social Support  ADL's:  Intact  Cognition: WNL  Sleep:  Good   Screenings: GAD-7     Counselor from 08/29/2016 [facility] Counselor from 12/27/2015 in [facility] Counselor from 07/28/2015 in [facility]  Total GAD-7 Score  16  17  9     PHQ2-9     Counselor from 08/29/2016 in [facility] Counselor from 12/27/2015 in [facility] Counselor from 07/28/2015 [facility]  PHQ-2 Total Score  6  6  1   PHQ-9 Total Score  18  16  No data       Assessment and Plan: Patient is a 57 year old female with a history of depression and anxiety.  She seems to be under fairly good control with Zoloft 200 mg daily as well as Xanax 1 mg 4 times a day.  She will continue this regimen as well as continue her counseling here with Florencia Reasons.  She will return to see me in 3 months   Diannia Ruder, MD 04/02/2017, 1:33 PM

## 2017-04-09 ENCOUNTER — Ambulatory Visit (HOSPITAL_COMMUNITY): Payer: Self-pay | Admitting: Psychiatry

## 2017-04-23 ENCOUNTER — Ambulatory Visit (HOSPITAL_COMMUNITY)
Admission: RE | Admit: 2017-04-23 | Discharge: 2017-04-23 | Disposition: A | Discharge status: Home / Self Care | Payer: Medicaid Other | Source: Ambulatory Visit | Attending: Adult Health Nurse Practitioner | Admitting: Adult Health Nurse Practitioner

## 2017-04-23 ENCOUNTER — Other Ambulatory Visit (HOSPITAL_COMMUNITY): Payer: Self-pay | Admitting: Adult Health Nurse Practitioner

## 2017-04-23 DIAGNOSIS — J440 Chronic obstructive pulmonary disease with acute lower respiratory infection: Secondary | ICD-10-CM | POA: Diagnosis not present

## 2017-04-23 DIAGNOSIS — J209 Acute bronchitis, unspecified: Secondary | ICD-10-CM | POA: Insufficient documentation

## 2017-04-25 ENCOUNTER — Ambulatory Visit: Payer: Self-pay | Admitting: Gastroenterology

## 2017-05-15 ENCOUNTER — Ambulatory Visit (INDEPENDENT_AMBULATORY_CARE_PROVIDER_SITE_OTHER): Payer: Medicaid Other | Admitting: Psychiatry

## 2017-05-15 ENCOUNTER — Encounter (HOSPITAL_COMMUNITY): Payer: Self-pay | Admitting: Psychiatry

## 2017-05-15 DIAGNOSIS — F411 Generalized anxiety disorder: Secondary | ICD-10-CM

## 2017-05-15 DIAGNOSIS — F331 Major depressive disorder, recurrent, moderate: Secondary | ICD-10-CM | POA: Diagnosis not present
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
## 2017-05-21 ENCOUNTER — Ambulatory Visit (HOSPITAL_COMMUNITY): Payer: Medicaid Other | Admitting: Psychiatry

## 2017-06-11 ENCOUNTER — Encounter (HOSPITAL_COMMUNITY): Payer: Self-pay | Admitting: Psychiatry

## 2017-06-11 ENCOUNTER — Ambulatory Visit (INDEPENDENT_AMBULATORY_CARE_PROVIDER_SITE_OTHER): Payer: Medicaid Other | Admitting: Psychiatry

## 2017-06-11 DIAGNOSIS — F331 Major depressive disorder, recurrent, moderate: Secondary | ICD-10-CM

## 2017-06-11 DIAGNOSIS — F411 Generalized anxiety disorder: Secondary | ICD-10-CM | POA: Diagnosis not present

## 2017-06-11 DIAGNOSIS — F329 Major depressive disorder, single episode, unspecified: Secondary | ICD-10-CM

## 2017-06-11 NOTE — Progress Notes (Signed)
                      THERAPIST PROGRESS NOTE       Session Time:    Monday 06/11/2017 9:15 AM -  10:00 AM          Participation Level: Active     Behavioral Response: CasualAlertAnxious/very talkative/tangentiality/tearful,sad  Type of Therapy: Individual Therapy   Treatment Goals:     1. Learn and implement calming and coping strategies to reduce anxiety and emotional anger outbursts.        2. Learn and implement behavioral strategies to overcome depression     3. Process and resolve grief and loss issues related to husband's death.  Treatment Goals addressed:  1  Interventions: CBT and Supportive  Summary: Heather Wang is a 57 y.o. female  who is referred for services by psychiatrist Dr. Tenny Crawoss to improve coping skills. Patient presents with a long standing history of symptoms of depression and anxiety since early childhood. She reports numerous hospitalizations from age 24 to age 89 due to a nonfunctioning right kidney. She eventually had surgery to have kidney remove. She also reports both mother and grandmother suffered from symptoms of anxiety and states learning some of her behaviors from them because they were always sick and worried. She says she has always had problems coping with daily life. She suffers from frequent panic attacks.   Patient last was seen about 4  weeks ago. She reports continued depressed mood, poor motivation, excessive sleeping, fatigue, anxiety, excessive worry, poor concentration, memory difficulty, and anger since last session. She continues to have anger outbursts. She continues to report minimal involvement in activities. She continues to have multiple stressors including issues with her adult daughters, concerns about her grandchildren especially her granddaughter, and financial issues. She reports having ruminating negative derogatory thoughts about self when granddaughter complains about patient not having enough money to buy  things granddaughter wants. Patient reports she becomes more depressed and does not engage in activity.  Suicidal/Homicidal: No   Therapist Response: Reviewed symptoms, facilitated expression of thoughts and feelings, used cognitive defusion techlnique to help patient cope with ruminating thoughts and painful feelings related to patient's lack of finances to purchase things granddaughter wants, assisted patient begin to identify value congruent behavior to increase behavioral activation  Plan: Return again in 2 -3 weeks.   Diagnosis: Axis I: MDD. GAD    Axis II: Deferred    Tamara Monteith, LCSW 06/11/2017

## 2017-06-18 ENCOUNTER — Ambulatory Visit (HOSPITAL_COMMUNITY): Payer: Medicaid Other | Admitting: Psychiatry

## 2017-07-02 ENCOUNTER — Ambulatory Visit (HOSPITAL_COMMUNITY): Payer: Medicaid Other | Admitting: Psychiatry

## 2017-07-02 ENCOUNTER — Encounter (HOSPITAL_COMMUNITY): Payer: Self-pay | Admitting: Psychiatry

## 2017-07-02 VITALS — BP 99/67 | HR 72 | Ht 64.0 in | Wt 103.0 lb

## 2017-07-02 DIAGNOSIS — F17210 Nicotine dependence, cigarettes, uncomplicated: Secondary | ICD-10-CM | POA: Diagnosis not present

## 2017-07-02 DIAGNOSIS — Z560 Unemployment, unspecified: Secondary | ICD-10-CM | POA: Diagnosis not present

## 2017-07-02 DIAGNOSIS — F411 Generalized anxiety disorder: Secondary | ICD-10-CM

## 2017-07-02 DIAGNOSIS — Z818 Family history of other mental and behavioral disorders: Secondary | ICD-10-CM

## 2017-07-02 DIAGNOSIS — F331 Major depressive disorder, recurrent, moderate: Secondary | ICD-10-CM

## 2017-07-02 MED ORDER — ALPRAZOLAM 1 MG PO TABS: 1.0000 mg | ORAL_TABLET | Freq: Four times a day (QID) | ORAL | 2 refills | Status: DC

## 2017-07-02 MED ORDER — SERTRALINE HCL 100 MG PO TABS: 200.0000 mg | ORAL_TABLET | Freq: Every day | ORAL | 2 refills | Status: DC

## 2017-07-02 NOTE — Progress Notes (Signed)
BH MD/PA/NP OP Progress Note  07/02/2017 11:15 AM Heather Wang  MRN:  [id number]  Chief Complaint:  Chief Complaint    Depression; Anxiety; Follow-up     HPI:  this patient is a 57 year old widowed white female who lives with her 4034year-old [place]daughter [place]. She has 2 other daughters who live on their own. She's currently unemployed.  The patient was [facility] by [facility] [place] for further evaluation of depression and anxiety.  The patient reports that she's had on some depression since childhood. She was sick all the time and was constantly getting recurrent kidney infections. 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 She states that her mother and grandmother were anxious and worried all the time and she learned some of the behaviors from them.  The patient [facility] and tried Zoloft in the past. She's never had psychiatric hospitalizations.  Currently she is taking Celexa and Xanax which is helped to some degree but she still stays quite anxious. Her husband is in the terminal stages of prostate and bladder cancer which has spread to his bones. He refuses chemotherapy but is getting radiation. She and her daughter are the main caregivers. She states that she feels sad and weepy at times but other times she gets angered with people easily. She's impulsive and is quick to say what she thinks to people and stores and hospitals etc. She has a lot of preformed opinions about other people. She states her energy is low she sometimes she has difficulty sleeping her appetite is variable. Sometimes she sees things that aren't there like an animal rushing through the yard or sometimes hears her deceased father's voice. He seemed to be more related to anxiety and misperception. She denies hallucinations or paranoia. She has never had any suicidal or homicidal ideation. She does not use drugs or  alcohol  The patient returns after 3 months.  She states that she is doing okay.  She is still stressed because her granddaughters father is not always making his child support payments on time.  They have been arguing a lot about this.  She is still not gaining much weight but now she has disability so she may have a little bit more money to spend on food.  She states overall her mood is under pretty good control as is her anxiety.  Visit Diagnosis:    ICD-10-CM   1. Major depressive disorder, recurrent episode, moderate (HCC) F33.1   2. Generalized anxiety disorder F41.1     Past Psychiatric History: none  Past Medical History:  Past Medical History:  Diagnosis Date  . Anxiety   . Chronic back pain   . COPD (chronic obstructive pulmonary disease) (HCC)    Diagnosed in August 2016  . Graves disease   . Osteopetrosis   . Panic attacks   . Pneumonia   . Thyroid disease     Past Surgical History:  Procedure Laterality Date  . NEPHRECTOMY     [place]   . TOTAL ABDOMINAL HYSTERECTOMY    . TUBAL LIGATION      Family Psychiatric History: See below  Family History:  Family History  Problem Relation Age of Onset  . Anxiety disorder Mother   . Depression Mother   . Heart defect Unknown        family history   . Cancer Unknown        family history   .  Arthritis Unknown        family history   . Depression Brother   . Anxiety disorder Maternal Grandmother   . Depression Maternal Grandmother     Social History:  Social History   Socioeconomic History  . Marital status: Married    Spouse name: Not on file  . Number of children: Not on file  . Years of education: 11th   . Highest education level: Not on file  Occupational History  . Occupation: unemployed   Social Needs  . Financial resource strain: Not on file  . Food insecurity:    Worry: Not on file    Inability: Not on file  . Transportation needs:    Medical: Not on file    Non-medical: Not on file   Tobacco Use  . Smoking status: Current Every Day Smoker    Packs/day: 1.00    Types: Cigarettes  . Smokeless tobacco: Never Used  Substance and Sexual Activity  . Alcohol use: No  . Drug use: No  . Sexual activity: Never  Lifestyle  . Physical activity:    Days per week: Not on file    Minutes per session: Not on file  . Stress: Not on file  Relationships  . Social connections:    Talks on phone: Not on file    Gets together: Not on file    Attends religious service: Not on file    Active member of club or organization: Not on file    Attends meetings of clubs or organizations: Not on file    Relationship status: Not on file  Other Topics Concern  . Not on file  Social History Narrative  . Not on file    Allergies:  Allergies  Allergen Reactions  . Nsaids Other (See Comments)    Kidney condition  . Sulfa Antibiotics Nausea And Vomiting    Metabolic Disorder Labs: No results found for: HGBA1C, MPG No results found for: PROLACTIN No results found for: CHOL, TRIG, HDL, CHOLHDL, VLDL, LDLCALC No results found for: TSH  Therapeutic Level Labs: No results found for: LITHIUM No results found for: VALPROATE No components found for:  CBMZ  Current Medications: Current Outpatient Medications  Medication Sig Dispense Refill  . acetaminophen (TYLENOL) 500 MG tablet Take 1,000 mg by mouth every 6 (six) hours as needed.    . ALPRAZolam (XANAX) 1 MG tablet Take 1 tablet (1 mg total) by mouth 4 (four) times daily. 120 tablet 2  . budesonide-formoterol (SYMBICORT) 160-4.5 MCG/ACT inhaler Inhale 2 puffs into the lungs 2 (two) times daily.    Marland Kitchen denosumab (PROLIA) 60 MG/ML SOLN injection Inject 60 mg into the skin every 6 (six) months. Administer in upper arm, thigh, or abdomen    . sertraline (ZOLOFT) 100 MG tablet Take 2 tablets (200 mg total) by mouth daily. 60 tablet 2   No current facility-administered medications for this visit.      Musculoskeletal: Strength & Muscle  Tone: within normal limits Gait & Station: normal Patient leans: N/A  Psychiatric Specialty Exam: Review of Systems  All other systems reviewed and are negative.   Blood pressure 99/67, pulse 72, height 5\' 4"  (1.626 m), weight 103 lb (46.7 kg), SpO2 97 %.Body mass index is 17.68 kg/m.  General Appearance: Casual and Fairly Groomed  Eye Contact:  Good  Speech:  Clear and Coherent  Volume:  Normal  Mood:  Euthymic  Affect:  Congruent  Thought Process:  Goal Directed  Orientation:  Full (  Time, Place, and Person)  Thought Content: Rumination   Suicidal Thoughts:  No  Homicidal Thoughts:  No  Memory:  Immediate;   Good Recent;   Good Remote;   Fair  Judgement:  Fair  Insight:  Lacking  Psychomotor Activity:  Restlessness  Concentration:  Concentration: Fair and Attention Span: Fair  Recall:  Good  Fund of Knowledge: Fair  Language: Fair  Akathisia:  No  Handed:  Right  AIMS (if indicated): not done  Assets:  Communication Skills Desire for Improvement Resilience Social Support  ADL's:  Intact  Cognition: WNL  Sleep:  Good   Screenings: GAD-7     Counsel[facility] Counselor from 12/27/2015 [facility] Counselor from 07/28/2015 [facility]  Total GAD-7 Score  16  17  9     PHQ2-9     Counselor from 08/29/2016 [facility] Counselor from 12/27/2015 [facility] Counselor from 07/28/2015 in [facility]  PHQ-2 Total Score  6  6  1   PHQ-9 Total Score  18  16  -       Assessment and Plan: This patient is a 57 year old female with a history of depression and anxiety.  She does react a lot of situational stressors but the therapy here has been helpful.  She also feels that her medicines have helped  her symptoms as well.  She will continue Zoloft 200 mg daily for depression and Xanax 1 mg 4 times daily for anxiety.  She will return to see me in 3 months   Diannia Ruder, MD 07/02/2017, 11:15 AM

## 2017-07-18 ENCOUNTER — Ambulatory Visit (HOSPITAL_COMMUNITY): Payer: Self-pay | Admitting: Psychiatry

## 2017-08-01 ENCOUNTER — Ambulatory Visit (INDEPENDENT_AMBULATORY_CARE_PROVIDER_SITE_OTHER): Payer: Medicaid Other | Admitting: Psychiatry

## 2017-08-01 ENCOUNTER — Encounter (HOSPITAL_COMMUNITY): Payer: Self-pay | Admitting: Psychiatry

## 2017-08-01 DIAGNOSIS — F329 Major depressive disorder, single episode, unspecified: Secondary | ICD-10-CM

## 2017-08-01 DIAGNOSIS — F411 Generalized anxiety disorder: Secondary | ICD-10-CM | POA: Diagnosis not present

## 2017-08-01 DIAGNOSIS — F331 Major depressive disorder, recurrent, moderate: Secondary | ICD-10-CM

## 2017-08-01 NOTE — Progress Notes (Signed)
                      THERAPIST PROGRESS NOTE       Session Time:    Wednesday 08/01/2017 11:08 AM - 11:50 AM          Participation Level: Active     Behavioral Response: CasualAlertAnxious/very talkative/tangentiality/anxious/sad  Type of Therapy: Individual Therapy   Treatment Goals:     1. Learn and implement calming and coping strategies to reduce anxiety and emotional anger outbursts.        2. Learn and implement behavioral strategies to overcome depression     3. Process and resolve grief and loss issues related to husband's death.  Treatment Goals addressed:  1  Interventions: CBT and Supportive  Summary: Heather Wang is a 57 y.o. female  who is referred for services by psychiatrist Dr. Tenny Craw to improve coping skills. Patient presents with a long standing history of symptoms of depression and anxiety since early childhood. She reports numerous hospitalizations from age 64 to age 36 due to a nonfunctioning right kidney. She eventually had surgery to have kidney remove. She also reports both mother and grandmother suffered from symptoms of anxiety and states learning some of her behaviors from them because they were always sick and worried. She says she has always had problems coping with daily life. She suffers from frequent panic attacks.   Patient last was seen in March 2019. She reports increased involvement in activity (visiting friends, doing activities with granddaughter like swimming and going to movies). She reports looking for apartments and completing applications. She currently is on waiting list. She reports less worry about granddaughter who is doing well in school and participating with ROTC. She also expresses less guilt about being unable to buy granddaughter items she wants instead of need. Patient is pleased she has been approved for disability but now is worried about how this may affect her widower's pension from the [employer]. She expresses  sadness today as one of her close friends was found dead in his home yesterday due to a heart attack. Patient continues to experience anxiety and excessive worry.   Suicidal/Homicidal: No   Therapist Response: Reviewed symptoms, praised and reinforce patient's increased involvement in activity/socialization, discussed effects,  facilitated expression of thoughts and feelings, normalized and validated feelings related to grief and loss issues,    Plan: Return again in 2 -3 weeks.   Diagnosis: Axis I: MDD. GAD    Axis II: Deferred    Dorann Davidson, LCSW 08/01/2017

## 2017-08-21 ENCOUNTER — Ambulatory Visit (HOSPITAL_COMMUNITY): Payer: Self-pay | Admitting: Psychiatry

## 2017-09-04 ENCOUNTER — Ambulatory Visit (HOSPITAL_COMMUNITY): Payer: Medicaid Other | Admitting: Psychiatry

## 2017-09-18 ENCOUNTER — Ambulatory Visit (HOSPITAL_COMMUNITY): Payer: Medicaid Other | Admitting: Psychiatry

## 2017-09-25 ENCOUNTER — Telehealth (HOSPITAL_COMMUNITY): Payer: Self-pay | Admitting: *Deleted

## 2017-09-25 ENCOUNTER — Other Ambulatory Visit (HOSPITAL_COMMUNITY): Payer: Self-pay | Admitting: Psychiatry

## 2017-09-25 MED ORDER — ALPRAZOLAM 1 MG PO TABS: 1.0000 mg | ORAL_TABLET | Freq: Four times a day (QID) | ORAL | 2 refills | Status: DC

## 2017-09-25 MED ORDER — SERTRALINE HCL 100 MG PO TABS: 200.0000 mg | ORAL_TABLET | Freq: Every day | ORAL | 2 refills | Status: DC

## 2017-09-25 NOTE — Telephone Encounter (Signed)
LVM per Provider Med's Sent, she will need to make appt. Due to scheduleding issue 10/02/17 will be rescheduled

## 2017-09-25 NOTE — Telephone Encounter (Signed)
Dr Tenny Crawoss Patient called in requesting refills on Xanax & Zoloft

## 2017-09-25 NOTE — Telephone Encounter (Signed)
Sent, she will need to make appt 

## 2017-10-02 ENCOUNTER — Ambulatory Visit (HOSPITAL_COMMUNITY): Payer: Medicaid Other | Admitting: Psychiatry

## 2017-10-09 ENCOUNTER — Ambulatory Visit (HOSPITAL_COMMUNITY): Payer: Medicaid Other | Admitting: Psychiatry

## 2017-10-24 ENCOUNTER — Ambulatory Visit (HOSPITAL_COMMUNITY): Payer: Medicaid Other | Admitting: Psychiatry

## 2017-10-26 ENCOUNTER — Ambulatory Visit (HOSPITAL_COMMUNITY): Payer: Medicaid Other | Admitting: Psychiatry

## 2017-11-08 ENCOUNTER — Ambulatory Visit (HOSPITAL_COMMUNITY): Payer: Self-pay | Admitting: Psychiatry

## 2018-09-25 ENCOUNTER — Other Ambulatory Visit: Payer: Self-pay

## 2018-09-25 ENCOUNTER — Ambulatory Visit (INDEPENDENT_AMBULATORY_CARE_PROVIDER_SITE_OTHER): Payer: Medicaid Other | Admitting: Psychiatry

## 2018-09-25 ENCOUNTER — Encounter (HOSPITAL_COMMUNITY): Payer: Self-pay | Admitting: Psychiatry

## 2018-09-25 DIAGNOSIS — F411 Generalized anxiety disorder: Secondary | ICD-10-CM

## 2018-09-25 DIAGNOSIS — F331 Major depressive disorder, recurrent, moderate: Secondary | ICD-10-CM

## 2018-09-25 MED ORDER — SERTRALINE HCL 50 MG PO TABS: 50.0000 mg | ORAL_TABLET | Freq: Every day | ORAL | 2 refills | Status: DC

## 2018-09-25 NOTE — Progress Notes (Signed)
Virtual Visit via Video Note  I connected with Heather Wang on 09/25/18 at  9:20 AM EDT by a video enabled telemedicine application and verified that I am speaking with the correct person using two identifiers.   I discussed the limitations of evaluation and management by telemedicine and the availability of in person appointments. The patient expressed understanding and agreed to proceed.     I discussed the assessment and treatment plan with the patient. The patient was provided an opportunity to ask questions and all were answered. The patient agreed with the plan and demonstrated an understanding of the instructions.   The patient was advised to call back or seek an in-person evaluation if the symptoms worsen or if the condition fails to improve as anticipated.  I provided 15 minutes of non-face-to-face time during this encounter.   Diannia Rudereborah , MD  [facility]/NP OP Progress Note  09/25/2018 10:03 AM Heather Wang  MRN:  [id number]  Chief Complaint:  Chief Complaint    Anxiety; Follow-up     HPI: This patient is a 58 year old widowed white female who lives alone in [place].  She is currently on disability.  The patient returns after long absence.  She was last seen about 1 year ago.  She states she got approved for disability but lost her Medicaid insurance in the process.  She is now waiting to see if she will get it back.  She was off Xanax for a while but her primary doctor put her back on Xanax 1 mg twice daily a few weeks ago.  She states her anxiety and depression have gotten worse because her 57 year old granddaughter finished high school and got married abruptly to a boy she had only known for about 6 months.  The patient states that this boy does not like her and does not let her granddaughter see her speak to her very often.  Because of this she has become increasingly depressed.  The patient was on Zoloft but is no longer because she lost her insurance.  She states that  she is a bit more depressed and would like to go back perhaps at a lower dosage.  She denies suicidal ideation.  She is sleeping okay her energy is fairly good but she is sad about the entire situation with her granddaughter. Visit Diagnosis:    ICD-10-CM   1. Major depressive disorder, recurrent episode, moderate (HCC)  F33.1   2. Generalized anxiety disorder  F41.1     Past Psychiatric History: none  Past Medical History:  Past Medical History:  Diagnosis Date  . Anxiety   . Chronic back pain   . COPD (chronic obstructive pulmonary disease) (HCC)    Diagnosed in August 2016  . Graves disease   . Osteopetrosis   . Panic attacks   . Pneumonia   . Thyroid disease     Past Surgical History:  Procedure Laterality Date  . NEPHRECTOMY     native   . TOTAL ABDOMINAL HYSTERECTOMY    . TUBAL LIGATION      Family Psychiatric History: see below  Family History:  Family History  Problem Relation Age of Onset  . Anxiety disorder Mother   . Depression Mother   . Heart defect Unknown        family history   . Cancer Unknown        family history   . Arthritis Unknown        family history   . Depression  Brother   . Anxiety disorder Maternal Grandmother   . Depression Maternal Grandmother     Social History:  Social History   Socioeconomic History  . Marital status: Married    Spouse name: Not on file  . Number of children: Not on file  . Years of education: 11th   . Highest education level: Not on file  Occupational History  . Occupation: unemployed   Social Needs  . Financial resource strain: Not on file  . Food insecurity    Worry: Not on file    Inability: Not on file  . Transportation needs    Medical: Not on file    Non-medical: Not on file  Tobacco Use  . Smoking status: Current Every Day Smoker    Packs/day: 1.00    Types: Cigarettes  . Smokeless tobacco: Never Used  Substance and Sexual Activity  . Alcohol use: No  . Drug use: No  . Sexual  activity: Never  Lifestyle  . Physical activity    Days per week: Not on file    Minutes per session: Not on file  . Stress: Not on file  Relationships  . Social Herbalist on phone: Not on file    Gets together: Not on file    Attends religious service: Not on file    Active member of club or organization: Not on file    Attends meetings of clubs or organizations: Not on file    Relationship status: Not on file  Other Topics Concern  . Not on file  Social History Narrative  . Not on file    Allergies:  Allergies  Allergen Reactions  . Nsaids Other (See Comments)    Kidney condition  . Sulfa Antibiotics Nausea And Vomiting    Metabolic Disorder Labs: No results found for: HGBA1C, MPG No results found for: PROLACTIN No results found for: CHOL, TRIG, HDL, CHOLHDL, VLDL, LDLCALC No results found for: TSH  Therapeutic Level Labs: No results found for: LITHIUM No results found for: VALPROATE No components found for:  CBMZ  Current Medications: Current Outpatient Medications  Medication Sig Dispense Refill  . acetaminophen (TYLENOL) 500 MG tablet Take 1,000 mg by mouth every 6 (six) hours as needed.    . budesonide-formoterol (SYMBICORT) 160-4.5 MCG/ACT inhaler Inhale 2 puffs into the lungs 2 (two) times daily.    Marland Kitchen denosumab (PROLIA) 60 MG/ML SOLN injection Inject 60 mg into the skin every 6 (six) months. Administer in upper arm, thigh, or abdomen    . sertraline (ZOLOFT) 50 MG tablet Take 1 tablet (50 mg total) by mouth daily. 30 tablet 2   No current facility-administered medications for this visit.      Musculoskeletal: Strength & Muscle Tone: within normal limits Gait & Station: normal Patient leans: N/A  Psychiatric Specialty Exam: Review of Systems  Psychiatric/Behavioral: Positive for depression. The patient is nervous/anxious.   All other systems reviewed and are negative.   There were no vitals taken for this visit.There is no height or  weight on file to calculate BMI.  General Appearance: Bizarre and Fairly Groomed  Eye Contact:  Good  Speech:  Clear and Coherent  Volume:  Normal  Mood:  Anxious and Dysphoric  Affect:  Appropriate and Congruent  Thought Process:  Goal Directed  Orientation:  Full (Time, Place, and Person)  Thought Content: Rumination   Suicidal Thoughts:  No  Homicidal Thoughts:  No  Memory:  Immediate;   Good Recent;  Good Remote;   Fair  Judgement:  Fair  Insight:  Fair  Psychomotor Activity:  Decreased  Concentration:  Concentration: Good and Attention Span: Good  Recall:  Good  Fund of Knowledge: Fair  Language: Good  Akathisia:  No  Handed:  Right  AIMS (if indicated): not done  Assets:  Communication Skills Desire for Improvement Resilience Social Support Talents/Skills  ADL's:  Intact  Cognition: WNL  Sleep:  Fair   Screenings: GAD-7     Counselor from 08[facility]29[facility] Counselor from 12/27/2015 in [facility] Counselor from 07/28/2015 in [facility]  Total GAD-7 Score  16  17  9     PHQ2-9     Counselor from 08/29/2016 in [facility] Counselor from 12/27/2015 in [facility] Counselor from 07/28/2015 [facility]  PHQ-2 Total Score  6  6  1   PHQ-9 Total Score  18  16  -       Assessment and Plan: This patient is a 58 year old female with a history of depression anxiety and panic attacks who returns after long absence.  Her primary physician has reinstated her Xanax at 1 mg twice daily.  Since she is more depressed about her granddaughter's departure we will start Zoloft 50 mg daily.  She will return to see me in 2 months   Diannia Rudereborah , MD 09/25/2018, 10:03 AM

## 2018-11-04 ENCOUNTER — Other Ambulatory Visit (HOSPITAL_COMMUNITY): Payer: Self-pay | Admitting: Psychiatry

## 2018-11-04 ENCOUNTER — Telehealth (HOSPITAL_COMMUNITY): Payer: Self-pay | Admitting: *Deleted

## 2018-11-04 MED ORDER — ALPRAZOLAM 1 MG PO TABS: 1.0000 mg | ORAL_TABLET | Freq: Four times a day (QID) | ORAL | 2 refills | Status: DC

## 2018-11-04 NOTE — Telephone Encounter (Signed)
Patient called with request if you could start back ordering her Xanax? Her primary physician has reinstated her Xanax at 1 mg twice daily & she stated that all she's going thru that isn't enough. And asked if you would put her back on the dose she was previously taking?

## 2018-11-04 NOTE — Telephone Encounter (Signed)
Sent. She may not be able to fill it if she recently filled the other MDs prescription

## 2018-11-26 ENCOUNTER — Ambulatory Visit (INDEPENDENT_AMBULATORY_CARE_PROVIDER_SITE_OTHER): Payer: Medicaid Other | Admitting: Psychiatry

## 2018-11-26 ENCOUNTER — Other Ambulatory Visit: Payer: Self-pay

## 2018-11-26 ENCOUNTER — Encounter (HOSPITAL_COMMUNITY): Payer: Self-pay | Admitting: Psychiatry

## 2018-11-26 DIAGNOSIS — F411 Generalized anxiety disorder: Secondary | ICD-10-CM

## 2018-11-26 DIAGNOSIS — F331 Major depressive disorder, recurrent, moderate: Secondary | ICD-10-CM

## 2018-11-26 MED ORDER — ALPRAZOLAM 1 MG PO TABS: 1.0000 mg | ORAL_TABLET | Freq: Four times a day (QID) | ORAL | 2 refills | Status: DC

## 2018-11-26 MED ORDER — SERTRALINE HCL 50 MG PO TABS: 50.0000 mg | ORAL_TABLET | Freq: Every day | ORAL | 2 refills | Status: DC

## 2018-11-26 NOTE — Progress Notes (Signed)
Virtual Visit via Video Note  I connected with Heather Wang on 11/26/18 at  9:00 AM EDT by a video enabled telemedicine application and verified that I am speaking with the correct person using two identifiers.   I discussed the limitations of evaluation and management by telemedicine and the availability of in person appointments. The patient expressed understanding and agreed to proceed.     I discussed the assessment and treatment plan with the patient. The patient was provided an opportunity to ask questions and all were answered. The patient agreed with the plan and demonstrated an understanding of the instructions.   The patient was advised to call back or seek an in-person evaluation if the symptoms worsen or if the condition fails to improve as anticipated.  I provided 15 minutes of non-face-to-face time during this encounter.   Diannia Rudereborah Jamelia Varano, MD  [facility] OP Progress Note  11/26/2018 9:22 AM Heather Wang  MRN:  [id number]  Chief Complaint:  Chief Complaint    Depression; Anxiety; Follow-up     HPI: This patient is a 58 year old widowed white female who lives alone in [place].  She is currently on disability.  The patient returns after 2 months.  She states that she is doing a little bit better since we started Zoloft 50 mg daily.  Her brother and his family also invited her to go on a beach trip and this helped her relax.  She still very worried about her 331 year old granddaughter who got married abruptly after high school ended.  She does not think that the husband is very good for her and he is very controlling.  Obviously there is not much she can do about this.  The patient still takes Xanax today and it helps with her anxiety.  She is sleeping fairly well and denies any thoughts of self-harm or suicidal ideation or serious depression. Visit Diagnosis:    ICD-10-CM   1. Major depressive disorder, recurrent episode, moderate (HCC)  F33.1   2. Generalized anxiety  disorder  F41.1     Past Psychiatric History: none  Past Medical History:  Past Medical History:  Diagnosis Date  . Anxiety   . Chronic back pain   . COPD (chronic obstructive pulmonary disease) (HCC)    Diagnosed in August 2016  . Graves disease   . Osteopetrosis   . Panic attacks   . Pneumonia   . Thyroid disease     Past Surgical History:  Procedure Laterality Date  . NEPHRECTOMY     native   . TOTAL ABDOMINAL HYSTERECTOMY    . TUBAL LIGATION      Family Psychiatric History: see below Family History:  Family History  Problem Relation Age of Onset  . Anxiety disorder Mother   . Depression Mother   . Heart defect Unknown        family history   . Cancer Unknown        family history   . Arthritis Unknown        family history   . Depression Brother   . Anxiety disorder Maternal Grandmother   . Depression Maternal Grandmother     Social History:  Social History   Socioeconomic History  . Marital status: Married    Spouse name: Not on file  . Number of children: Not on file  . Years of education: 11th   . Highest education level: Not on file  Occupational History  . Occupation: unemployed   Social Needs  .  Financial resource strain: Not on file  . Food insecurity    Worry: Not on file    Inability: Not on file  . Transportation needs    Medical: Not on file    Non-medical: Not on file  Tobacco Use  . Smoking status: Current Every Day Smoker    Packs/day: 1.00    Types: Cigarettes  . Smokeless tobacco: Never Used  Substance and Sexual Activity  . Alcohol use: No  . Drug use: No  . Sexual activity: Never  Lifestyle  . Physical activity    Days per week: Not on file    Minutes per session: Not on file  . Stress: Not on file  Relationships  . Social Musician on phone: Not on file    Gets together: Not on file    Attends religious service: Not on file    Active member of club or organization: Not on file    Attends meetings of  clubs or organizations: Not on file    Relationship status: Not on file  Other Topics Concern  . Not on file  Social History Narrative  . Not on file    Allergies:  Allergies  Allergen Reactions  . Nsaids Other (See Comments)    Kidney condition  . Sulfa Antibiotics Nausea And Vomiting    Metabolic Disorder Labs: No results found for: HGBA1C, MPG No results found for: PROLACTIN No results found for: CHOL, TRIG, HDL, CHOLHDL, VLDL, LDLCALC No results found for: TSH  Therapeutic Level Labs: No results found for: LITHIUM No results found for: VALPROATE No components found for:  CBMZ  Current Medications: Current Outpatient Medications  Medication Sig Dispense Refill  . acetaminophen (TYLENOL) 500 MG tablet Take 1,000 mg by mouth every 6 (six) hours as needed.    . ALPRAZolam (XANAX) 1 MG tablet Take 1 tablet (1 mg total) by mouth 4 (four) times daily. 120 tablet 2  . budesonide-formoterol (SYMBICORT) 160-4.5 MCG/ACT inhaler Inhale 2 puffs into the lungs 2 (two) times daily.    Marland Kitchen denosumab (PROLIA) 60 MG/ML SOLN injection Inject 60 mg into the skin every 6 (six) months. Administer in upper arm, thigh, or abdomen    . sertraline (ZOLOFT) 50 MG tablet Take 1 tablet (50 mg total) by mouth daily. 30 tablet 2   No current facility-administered medications for this visit.      Musculoskeletal: Strength & Muscle Tone: within normal limits Gait & Station: normal Patient leans: N/A  Psychiatric Specialty Exam: Review of Systems  All other systems reviewed and are negative.   There were no vitals taken for this visit.There is no height or weight on file to calculate BMI.  General Appearance: Casual and Fairly Groomed  Eye Contact:  Good  Speech:  Clear and Coherent  Volume:  Normal  Mood:  Anxious  Affect:  Appropriate and Congruent  Thought Process:  Goal Directed  Orientation:  Full (Time, Place, and Person)  Thought Content: Rumination   Suicidal Thoughts:  No   Homicidal Thoughts:  No  Memory:  Immediate;   Good Recent;   Good Remote;   Fair  Judgement:  Fair  Insight:  Fair  Psychomotor Activity:  Normal  Concentration:  Concentration: Good and Attention Span: Good  Recall:  Good  Fund of Knowledge: Fair  Language: Good  Akathisia:  No  Handed:  Right  AIMS (if indicated): not done  Assets:  Communication Skills Desire for Improvement Physical Health Resilience Social  Support Talents/Skills  ADL's:  Intact  Cognition: WNL  Sleep:  Good   Screenings: GAD-7     Counselor from 08/29/2016 in [facility] Counselor from 12/27/2015 in [facility] Counselor from 07/28/2015 in [place] [facility]  Total GAD-7 Score  16  17  9     PHQ2-9     Counselor from 08/29/2016 in [place] Counselor from 12/27/2015 in [place] Counselor from 07/28/2015 in [place] [facility]  PHQ-2 Total Score  6  6  1   PHQ-9 Total Score  18  16  -       Assessment and Plan: This patient is a 58 year old female with a history of depression and anxiety.  She seems to be doing better since we added the Zoloft.  She will continue Zoloft 50 mg daily for depression and Xanax 1 mg 4 times daily for anxiety.  She will return to see me in 3 months   Levonne Spiller, MD 11/26/2018, 9:22 AM

## 2019-02-25 ENCOUNTER — Encounter (HOSPITAL_COMMUNITY): Payer: Self-pay | Admitting: Psychiatry

## 2019-02-25 ENCOUNTER — Other Ambulatory Visit: Payer: Self-pay

## 2019-02-25 ENCOUNTER — Ambulatory Visit (INDEPENDENT_AMBULATORY_CARE_PROVIDER_SITE_OTHER): Payer: Self-pay | Admitting: Psychiatry

## 2019-02-25 DIAGNOSIS — F411 Generalized anxiety disorder: Secondary | ICD-10-CM

## 2019-02-25 DIAGNOSIS — F331 Major depressive disorder, recurrent, moderate: Secondary | ICD-10-CM

## 2019-02-25 MED ORDER — SERTRALINE HCL 100 MG PO TABS: 100.0000 mg | ORAL_TABLET | Freq: Every day | ORAL | 2 refills | Status: DC

## 2019-02-25 MED ORDER — ALPRAZOLAM 1 MG PO TABS: 1.0000 mg | ORAL_TABLET | Freq: Four times a day (QID) | ORAL | 2 refills | Status: DC

## 2019-02-25 NOTE — Progress Notes (Signed)
Virtual Visit via Video Note  I connected with Heather Wang on 02/25/19 at  9:00 AM EST by a video enabled telemedicine application and verified that I am speaking with the correct person using two identifiers.   I discussed the limitations of evaluation and management by telemedicine and the availability of in person appointments. The patient expressed understanding and agreed to proceed.     I discussed the assessment and treatment plan with the patient. The patient was provided an opportunity to ask questions and all were answered. The patient agreed with the plan and demonstrated an understanding of the instructions.   The patient was advised to call back or seek an in-person evaluation if the symptoms worsen or if the condition fails to improve as anticipated.  I provided 15 minutes of non-face-to-face time during this encounter.   Diannia Ruder, MD  [facility] MD/PA/NP OP Progress Note  02/25/2019 9:43 AM Heather Wang  MRN:  [id number]  Chief Complaint:  Chief Complaint    Depression; Anxiety; Follow-up     HPI: This patient is a 58 year old widowed white female who lives alone in [place].  She is on disability.  The patient returns after 2 months for follow-up regarding depression and anxiety.  She is more depressed lately because she is having conflicts with her granddaughters husband.  She states that this boy basically convinced her granddaughter to get married right out of high school and ruined her granddaughters life.  She has barred him from coming on her property and he has taken charges out against her claiming that she threatened to kill him.  Now she has to go to court and pay for an attorney.  She states that she is tired all the time and sleeps a lot and does not have much energy.  She denies being suicidal.  I urged her to think about trying to make peace with her granddaughter and her husband since she does not seem to be interested in leaving him anytime soon.  In the  interim we can look at increasing her Zoloft which may help her depressive symptoms. Visit Diagnosis:    ICD-10-CM   1. Major depressive disorder, recurrent episode, moderate (HCC)  F33.1   2. Generalized anxiety disorder  F41.1     Past Psychiatric History: none  Past Medical History:  Past Medical History:  Diagnosis Date  . Anxiety   . Chronic back pain   . COPD (chronic obstructive pulmonary disease) (HCC)    Diagnosed in August 2016  . Graves disease   . Osteopetrosis   . Panic attacks   . Pneumonia   . Thyroid disease     Past Surgical History:  Procedure Laterality Date  . NEPHRECTOMY     native   . TOTAL ABDOMINAL HYSTERECTOMY    . TUBAL LIGATION      Family Psychiatric History: See below  Family History:  Family History  Problem Relation Age of Onset  . Anxiety disorder Mother   . Depression Mother   . Heart defect Unknown        family history   . Cancer Unknown        family history   . Arthritis Unknown        family history   . Depression Brother   . Anxiety disorder Maternal Grandmother   . Depression Maternal Grandmother     Social History:  Social History   Socioeconomic History  . Marital status: Married    Spouse  name: Not on file  . Number of children: Not on file  . Years of education: 11th   . Highest education level: Not on file  Occupational History  . Occupation: unemployed   Social Needs  . Financial resource strain: Not on file  . Food insecurity    Worry: Not on file    Inability: Not on file  . Transportation needs    Medical: Not on file    Non-medical: Not on file  Tobacco Use  . Smoking status: Current Every Day Smoker    Packs/day: 1.00    Types: Cigarettes  . Smokeless tobacco: Never Used  Substance and Sexual Activity  . Alcohol use: No  . Drug use: No  . Sexual activity: Never  Lifestyle  . Physical activity    Days per week: Not on file    Minutes per session: Not on file  . Stress: Not on file   Relationships  . Social Musicianconnections    Talks on phone: Not on file    Gets together: Not on file    Attends religious service: Not on file    Active member of club or organization: Not on file    Attends meetings of clubs or organizations: Not on file    Relationship status: Not on file  Other Topics Concern  . Not on file  Social History Narrative  . Not on file    Allergies:  Allergies  Allergen Reactions  . Nsaids Other (See Comments)    Kidney condition  . Sulfa Antibiotics Nausea And Vomiting    Metabolic Disorder Labs: No results found for: HGBA1C, MPG No results found for: PROLACTIN No results found for: CHOL, TRIG, HDL, CHOLHDL, VLDL, LDLCALC No results found for: TSH  Therapeutic Level Labs: No results found for: LITHIUM No results found for: VALPROATE No components found for:  CBMZ  Current Medications: Current Outpatient Medications  Medication Sig Dispense Refill  . acetaminophen (TYLENOL) 500 MG tablet Take 1,000 mg by mouth every 6 (six) hours as needed.    . ALPRAZolam (XANAX) 1 MG tablet Take 1 tablet (1 mg total) by mouth 4 (four) times daily. 120 tablet 2  . budesonide-formoterol (SYMBICORT) 160-4.5 MCG/ACT inhaler Inhale 2 puffs into the lungs 2 (two) times daily.    Marland Kitchen. denosumab (PROLIA) 60 MG/ML SOLN injection Inject 60 mg into the skin every 6 (six) months. Administer in upper arm, thigh, or abdomen    . sertraline (ZOLOFT) 100 MG tablet Take 1 tablet (100 mg total) by mouth daily. 30 tablet 2   No current facility-administered medications for this visit.      Musculoskeletal: Strength & Muscle Tone: within normal limits Gait & Station: normal Patient leans: N/A  Psychiatric Specialty Exam: Review of Systems  Constitutional: Positive for malaise/fatigue.  Psychiatric/Behavioral: Positive for depression.  All other systems reviewed and are negative.   There were no vitals taken for this visit.There is no height or weight on file to  calculate BMI.  General Appearance: Casual and Fairly Groomed  Eye Contact:  Good  Speech:  Clear and Coherent  Volume:  Normal  Mood:  Dysphoric  Affect:  Appropriate and Congruent  Thought Process:  Goal Directed  Orientation:  Full (Time, Place, and Person)  Thought Content: Rumination   Suicidal Thoughts:  No  Homicidal Thoughts:  No  Memory:  Immediate;   Good Recent;   Good Remote;   Good  Judgement:  Fair  Insight:  Fair  Psychomotor  Activity:  Decreased  Concentration:  Concentration: Good and Attention Span: Good  Recall:  Good  Fund of Knowledge: Fair  Language: Good  Akathisia:  No  Handed:  Right  AIMS (if indicated): not done  Assets:  Communication Skills Desire for Improvement Physical Health Resilience Social Support Talents/Skills  ADL's:  Intact  Cognition: WNL  Sleep:  Good   Screenings: GAD-7     Counselor from 08/29/2016 in [place] Counselor from 12/27/2015 in [place] Counselor from 07/28/2015 in [place] [facility]  Total GAD-7 Score  16  17  9     PHQ2-9     Counselor from 08/29/2016 in [place] Counselor from 12/27/2015 in [place] Counselor from 07/28/2015 in [place] [facility]  PHQ-2 Total Score  6  6  1   PHQ-9 Total Score  18  16  -       Assessment and Plan: This patient is a 58 year old female with a history of depression and anxiety.  She states that she is more depressed due to the conflict within the family.  We will therefore increase Zoloft from 50 to 100 mg daily.  She will continue Xanax 1 mg 4 times daily for anxiety.  She will return to see me in 2 months   Levonne Spiller, MD 02/25/2019, 9:43 AM

## 2019-04-28 ENCOUNTER — Encounter (HOSPITAL_COMMUNITY): Payer: Self-pay | Admitting: Psychiatry

## 2019-04-28 ENCOUNTER — Ambulatory Visit (INDEPENDENT_AMBULATORY_CARE_PROVIDER_SITE_OTHER): Payer: Medicare Other | Admitting: Psychiatry

## 2019-04-28 ENCOUNTER — Other Ambulatory Visit: Payer: Self-pay

## 2019-04-28 DIAGNOSIS — F411 Generalized anxiety disorder: Secondary | ICD-10-CM | POA: Diagnosis not present

## 2019-04-28 DIAGNOSIS — F331 Major depressive disorder, recurrent, moderate: Secondary | ICD-10-CM | POA: Diagnosis not present

## 2019-04-28 MED ORDER — SERTRALINE HCL 100 MG PO TABS: 100.0000 mg | ORAL_TABLET | Freq: Every day | ORAL | 2 refills | Status: DC

## 2019-04-28 MED ORDER — ALPRAZOLAM 1 MG PO TABS: 1.0000 mg | ORAL_TABLET | Freq: Four times a day (QID) | ORAL | 2 refills | Status: DC
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
## 2019-05-14 ENCOUNTER — Other Ambulatory Visit: Payer: Self-pay

## 2019-05-14 ENCOUNTER — Ambulatory Visit (HOSPITAL_COMMUNITY): Payer: Medicare Other | Admitting: Psychiatry

## 2019-05-14 ENCOUNTER — Telehealth (HOSPITAL_COMMUNITY): Payer: Self-pay | Admitting: Psychiatry

## 2019-05-14 NOTE — Telephone Encounter (Signed)
Therapist attempted to contact patient twice via text through [email or web address] platform, no response.  Therapist called patient and left message indicating attempt and requesting patient call office. 

## 2019-05-22 ENCOUNTER — Ambulatory Visit (HOSPITAL_COMMUNITY): Payer: Medicare Other | Admitting: Psychiatry

## 2019-05-27 ENCOUNTER — Other Ambulatory Visit (HOSPITAL_COMMUNITY): Payer: Self-pay | Admitting: Psychiatry

## 2019-05-27 MED ORDER — ALPRAZOLAM 1 MG PO TABS: 1.0000 mg | ORAL_TABLET | Freq: Three times a day (TID) | ORAL | 2 refills | Status: DC

## 2019-05-30 ENCOUNTER — Other Ambulatory Visit: Payer: Self-pay

## 2019-05-30 ENCOUNTER — Ambulatory Visit (INDEPENDENT_AMBULATORY_CARE_PROVIDER_SITE_OTHER): Payer: Medicare Other | Admitting: Psychiatry

## 2019-05-30 ENCOUNTER — Encounter (HOSPITAL_COMMUNITY): Payer: Self-pay | Admitting: Psychiatry

## 2019-05-30 DIAGNOSIS — F411 Generalized anxiety disorder: Secondary | ICD-10-CM | POA: Diagnosis not present

## 2019-05-30 DIAGNOSIS — F331 Major depressive disorder, recurrent, moderate: Secondary | ICD-10-CM | POA: Diagnosis not present
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
## 2019-06-17 ENCOUNTER — Other Ambulatory Visit (HOSPITAL_COMMUNITY): Payer: Self-pay | Admitting: Psychiatry

## 2019-07-22 ENCOUNTER — Other Ambulatory Visit: Payer: Self-pay

## 2019-07-22 ENCOUNTER — Telehealth (INDEPENDENT_AMBULATORY_CARE_PROVIDER_SITE_OTHER): Payer: Medicare Other | Admitting: Psychiatry

## 2019-07-22 ENCOUNTER — Encounter (HOSPITAL_COMMUNITY): Payer: Self-pay | Admitting: Psychiatry

## 2019-07-22 DIAGNOSIS — F331 Major depressive disorder, recurrent, moderate: Secondary | ICD-10-CM | POA: Diagnosis not present

## 2019-07-22 DIAGNOSIS — F411 Generalized anxiety disorder: Secondary | ICD-10-CM | POA: Diagnosis not present

## 2019-07-22 DIAGNOSIS — F17210 Nicotine dependence, cigarettes, uncomplicated: Secondary | ICD-10-CM | POA: Diagnosis not present

## 2019-07-22 MED ORDER — SERTRALINE HCL 100 MG PO TABS: 100.0000 mg | ORAL_TABLET | Freq: Every day | ORAL | 1 refills | Status: DC

## 2019-07-22 MED ORDER — ALPRAZOLAM 1 MG PO TABS: 1.0000 mg | ORAL_TABLET | Freq: Three times a day (TID) | ORAL | 2 refills | Status: DC
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
## 2019-08-04 ENCOUNTER — Other Ambulatory Visit: Payer: Self-pay

## 2019-08-04 ENCOUNTER — Ambulatory Visit (INDEPENDENT_AMBULATORY_CARE_PROVIDER_SITE_OTHER): Payer: Medicare Other | Admitting: Psychiatry

## 2019-08-04 DIAGNOSIS — F411 Generalized anxiety disorder: Secondary | ICD-10-CM

## 2019-08-04 DIAGNOSIS — F331 Major depressive disorder, recurrent, moderate: Secondary | ICD-10-CM
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
## 2019-08-21 ENCOUNTER — Ambulatory Visit (INDEPENDENT_AMBULATORY_CARE_PROVIDER_SITE_OTHER): Payer: Medicare Other | Admitting: Psychiatry

## 2019-08-21 ENCOUNTER — Other Ambulatory Visit: Payer: Self-pay

## 2019-08-21 DIAGNOSIS — F331 Major depressive disorder, recurrent, moderate: Secondary | ICD-10-CM

## 2019-08-21 DIAGNOSIS — F411 Generalized anxiety disorder: Secondary | ICD-10-CM
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
## 2019-09-03 ENCOUNTER — Other Ambulatory Visit: Payer: Self-pay | Admitting: Adult Health Nurse Practitioner

## 2019-09-03 DIAGNOSIS — Z122 Encounter for screening for malignant neoplasm of respiratory organs: Secondary | ICD-10-CM

## 2019-09-03 DIAGNOSIS — F17210 Nicotine dependence, cigarettes, uncomplicated: Secondary | ICD-10-CM

## 2019-09-04 ENCOUNTER — Other Ambulatory Visit: Payer: Self-pay

## 2019-09-04 ENCOUNTER — Ambulatory Visit (INDEPENDENT_AMBULATORY_CARE_PROVIDER_SITE_OTHER): Payer: Medicare Other | Admitting: Psychiatry

## 2019-09-04 DIAGNOSIS — F411 Generalized anxiety disorder: Secondary | ICD-10-CM | POA: Diagnosis not present

## 2019-09-04 DIAGNOSIS — F331 Major depressive disorder, recurrent, moderate: Secondary | ICD-10-CM | POA: Diagnosis not present
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
## 2019-09-18 ENCOUNTER — Ambulatory Visit (INDEPENDENT_AMBULATORY_CARE_PROVIDER_SITE_OTHER): Payer: Medicare Other | Admitting: Psychiatry

## 2019-09-18 ENCOUNTER — Other Ambulatory Visit: Payer: Self-pay

## 2019-09-18 DIAGNOSIS — F411 Generalized anxiety disorder: Secondary | ICD-10-CM | POA: Diagnosis not present

## 2019-09-18 DIAGNOSIS — F331 Major depressive disorder, recurrent, moderate: Secondary | ICD-10-CM | POA: Diagnosis not present
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
## 2019-09-19 ENCOUNTER — Ambulatory Visit
Admission: RE | Admit: 2019-09-19 | Discharge: 2019-09-19 | Disposition: A | Discharge status: Home / Self Care | Payer: Medicare Other | Source: Ambulatory Visit | Attending: Adult Health Nurse Practitioner | Admitting: Adult Health Nurse Practitioner

## 2019-09-19 ENCOUNTER — Other Ambulatory Visit: Payer: Self-pay

## 2019-09-19 DIAGNOSIS — Z122 Encounter for screening for malignant neoplasm of respiratory organs: Secondary | ICD-10-CM

## 2019-09-19 DIAGNOSIS — F17210 Nicotine dependence, cigarettes, uncomplicated: Secondary | ICD-10-CM

## 2019-10-09 ENCOUNTER — Other Ambulatory Visit: Payer: Self-pay

## 2019-10-09 ENCOUNTER — Ambulatory Visit (INDEPENDENT_AMBULATORY_CARE_PROVIDER_SITE_OTHER): Payer: Medicare Other | Admitting: Psychiatry

## 2019-10-09 DIAGNOSIS — F411 Generalized anxiety disorder: Secondary | ICD-10-CM

## 2019-10-09 DIAGNOSIS — F331 Major depressive disorder, recurrent, moderate: Secondary | ICD-10-CM
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
## 2019-10-22 ENCOUNTER — Telehealth (INDEPENDENT_AMBULATORY_CARE_PROVIDER_SITE_OTHER): Payer: Medicare Other | Admitting: Psychiatry

## 2019-10-22 ENCOUNTER — Encounter (HOSPITAL_COMMUNITY): Payer: Self-pay | Admitting: Psychiatry

## 2019-10-22 ENCOUNTER — Other Ambulatory Visit: Payer: Self-pay

## 2019-10-22 DIAGNOSIS — F411 Generalized anxiety disorder: Secondary | ICD-10-CM

## 2019-10-22 DIAGNOSIS — F331 Major depressive disorder, recurrent, moderate: Secondary | ICD-10-CM | POA: Diagnosis not present

## 2019-10-22 MED ORDER — SERTRALINE HCL 100 MG PO TABS: 100.0000 mg | ORAL_TABLET | Freq: Every day | ORAL | 1 refills | Status: DC

## 2019-10-22 MED ORDER — ALPRAZOLAM 1 MG PO TABS: 1.0000 mg | ORAL_TABLET | Freq: Four times a day (QID) | ORAL | 2 refills | Status: DC
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
## 2019-10-28 ENCOUNTER — Other Ambulatory Visit: Payer: Self-pay

## 2019-10-28 ENCOUNTER — Ambulatory Visit (INDEPENDENT_AMBULATORY_CARE_PROVIDER_SITE_OTHER): Payer: Medicare Other | Admitting: Psychiatry

## 2019-10-28 DIAGNOSIS — F331 Major depressive disorder, recurrent, moderate: Secondary | ICD-10-CM

## 2019-10-28 DIAGNOSIS — F411 Generalized anxiety disorder: Secondary | ICD-10-CM
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
## 2019-11-18 ENCOUNTER — Telehealth (HOSPITAL_COMMUNITY): Payer: Self-pay | Admitting: Psychiatry

## 2019-11-18 ENCOUNTER — Other Ambulatory Visit: Payer: Self-pay

## 2019-11-18 ENCOUNTER — Ambulatory Visit (HOSPITAL_COMMUNITY): Payer: Medicare Other | Admitting: Psychiatry

## 2019-11-18 NOTE — Telephone Encounter (Signed)
Therapist attempted to contact patient for scheduled appointment and received voicemail message.  Therapist left message indicating attempt and requesting patient call office.

## 2019-11-25 ENCOUNTER — Ambulatory Visit (INDEPENDENT_AMBULATORY_CARE_PROVIDER_SITE_OTHER): Payer: Medicare Other | Admitting: Psychiatry

## 2019-11-25 ENCOUNTER — Other Ambulatory Visit: Payer: Self-pay

## 2019-11-25 DIAGNOSIS — F411 Generalized anxiety disorder: Secondary | ICD-10-CM

## 2019-11-25 DIAGNOSIS — F331 Major depressive disorder, recurrent, moderate: Secondary | ICD-10-CM
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
## 2019-12-15 ENCOUNTER — Ambulatory Visit (HOSPITAL_COMMUNITY): Payer: Medicare Other | Admitting: Psychiatry

## 2019-12-15 ENCOUNTER — Other Ambulatory Visit: Payer: Self-pay

## 2019-12-29 ENCOUNTER — Other Ambulatory Visit: Payer: Self-pay

## 2019-12-29 ENCOUNTER — Ambulatory Visit (INDEPENDENT_AMBULATORY_CARE_PROVIDER_SITE_OTHER): Payer: Medicare Other | Admitting: Psychiatry

## 2019-12-29 DIAGNOSIS — F411 Generalized anxiety disorder: Secondary | ICD-10-CM | POA: Diagnosis not present

## 2019-12-29 DIAGNOSIS — F331 Major depressive disorder, recurrent, moderate: Secondary | ICD-10-CM
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
## 2019-12-30 ENCOUNTER — Other Ambulatory Visit: Payer: Self-pay

## 2019-12-30 ENCOUNTER — Ambulatory Visit
Admission: EM | Admit: 2019-12-30 | Discharge: 2019-12-30 | Disposition: A | Discharge status: Home / Self Care | Payer: Medicare Other | Attending: Emergency Medicine | Admitting: Emergency Medicine

## 2019-12-30 ENCOUNTER — Encounter: Payer: Self-pay | Admitting: Emergency Medicine

## 2019-12-30 DIAGNOSIS — M25512 Pain in left shoulder: Secondary | ICD-10-CM | POA: Diagnosis not present

## 2019-12-30 MED ORDER — PREDNISONE 10 MG PO TABS
20.0000 mg | ORAL_TABLET | Freq: Every day | ORAL | 0 refills | Status: DC
Start: 2019-12-30 — End: 2020-01-22

## 2019-12-30 NOTE — ED Triage Notes (Signed)
left shoulder pain after helping a friend move. pain x 4 days has tried heating pad at home with little relief.

## 2019-12-30 NOTE — ED Provider Notes (Signed)
[facility]   [id number] 12/30/19 Arrival Time: 1500   Chief Complaint  Patient presents with  . Shoulder Pain     SUBJECTIVE: History from: patient.  Heather Wang is a 59 y.o. female who presented to the urgent care for complaint of left shoulder pain for the past 4 days.  States she helped her friend move.  She localizes the pain to the left shoulder.  She describes the pain as constant and achy.  She has tried OTC medications without relief.  Her symptoms are made worse with ROM.  She denies similar symptoms in the past.    ROS: As per HPI.  All other pertinent ROS negative.     Past Medical History:  Diagnosis Date  . Anxiety   . Chronic back pain   . COPD (chronic obstructive pulmonary disease) (HCC)    Diagnosed in August 2016  . Graves disease   . Osteopetrosis   . Panic attacks   . Pneumonia   . Thyroid disease    Past Surgical History:  Procedure Laterality Date  . NEPHRECTOMY     native   . TOTAL ABDOMINAL HYSTERECTOMY    . TUBAL LIGATION     Allergies  Allergen Reactions  . Nsaids Other (See Comments)    Kidney condition  . Sulfa Antibiotics Nausea And Vomiting   No current facility-administered medications on file prior to encounter.   Current Outpatient Medications on File Prior to Encounter  Medication Sig Dispense Refill  . acetaminophen (TYLENOL) 500 MG tablet Take 1,000 mg by mouth every 6 (six) hours as needed.    . ALPRAZolam (XANAX) 1 MG tablet Take 1 tablet (1 mg total) by mouth in the morning, at noon, in the evening, and at bedtime. 120 tablet 2  . budesonide-formoterol (SYMBICORT) 160-4.5 MCG/ACT inhaler Inhale 2 puffs into the lungs 2 (two) times daily.    Marland Kitchen denosumab (PROLIA) 60 MG/ML SOLN injection Inject 60 mg into the skin every 6 (six) months. Administer in upper arm, thigh, or abdomen    . sertraline (ZOLOFT) 100 MG tablet Take 1 tablet (100 mg total) by mouth daily. 90 tablet 1   Social History   Socioeconomic  History  . Marital status: Married    Spouse name: Not on file  . Number of children: Not on file  . Years of education: 11th   . Highest education level: Not on file  Occupational History  . Occupation: unemployed   Tobacco Use  . Smoking status: Current Every Day Smoker    Packs/day: 1.00    Types: Cigarettes  . Smokeless tobacco: Never Used  Substance and Sexual Activity  . Alcohol use: No  . Drug use: No  . Sexual activity: Never  Other Topics Concern  . Not on file  Social History Narrative  . Not on file   Social Determinants of Health   Financial Resource Strain:   . Difficulty of Paying Living Expenses: Not on file  Food Insecurity:   . Worried About Programme researcher, broadcasting/film/video in the Last Year: Not on file  . Ran Out of Food in the Last Year: Not on file  Transportation Needs:   . Lack of Transportation (Medical): Not on file  . Lack of Transportation (Non-Medical): Not on file  Physical Activity:   . Days of Exercise per Week: Not on file  . Minutes of Exercise per Session: Not on file  Stress:   . Feeling of Stress : Not on  file  Social Connections:   . Frequency of Communication with Friends and Family: Not on file  . Frequency of Social Gatherings with Friends and Family: Not on file  . Attends Religious Services: Not on file  . Active Member of Clubs or Organizations: Not on file  . Attends Banker Meetings: Not on file  . Marital Status: Not on file  Intimate Partner Violence:   . Fear of Current or Ex-Partner: Not on file  . Emotionally Abused: Not on file  . Physically Abused: Not on file  . Sexually Abused: Not on file   Family History  Problem Relation Age of Onset  . Anxiety disorder Mother   . Depression Mother   . Heart defect Other        family history   . Cancer Other        family history   . Arthritis Other        family history   . Depression Brother   . Anxiety disorder Maternal Grandmother   . Depression Maternal  Grandmother     OBJECTIVE:  Vitals:   12/30/19 1554  BP: (!) 90/59  Pulse: 73  Resp: 17  Temp: 98.2 F (36.8 C)  TempSrc: Oral  SpO2: 94%     Physical Exam Vitals and nursing note reviewed.  Constitutional:      General: She is not in acute distress.    Appearance: Normal appearance. She is normal weight. She is not ill-appearing, toxic-appearing or diaphoretic.  HENT:     Head: Normocephalic.  Cardiovascular:     Rate and Rhythm: Normal rate and regular rhythm.     Pulses: Normal pulses.     Heart sounds: Normal heart sounds. No murmur heard.  No friction rub. No gallop.   Pulmonary:     Effort: Pulmonary effort is normal. No respiratory distress.     Breath sounds: Normal breath sounds. No stridor. No wheezing, rhonchi or rales.  Chest:     Chest wall: No tenderness.  Musculoskeletal:        General: Tenderness present.     Right shoulder: Normal.     Left shoulder: Tenderness present.     Comments: Left shoulder is without any obvious asymmetry or deformity when compared to the right shoulder.  There is no ecchymosis, open wound, lesion, surface trauma, or warmth or swelling present.  Normal range of motion with pain.  Neurovascular status intact.  Neurological:     Mental Status: She is alert and oriented to person, place, and time.     LABS:  No results found for this or any previous visit (from the past 24 hour(s)).   ASSESSMENT & PLAN:  1. Acute pain of left shoulder     Meds ordered this encounter  Medications  . predniSONE (DELTASONE) 10 MG tablet    Sig: Take 2 tablets (20 mg total) by mouth daily.    Dispense:  15 tablet    Refill:  0   Discharge Instructions   Prednisone was prescribed Continue to take Tylenol as needed for pain Follow RICE instruction that is attached Follow-up with PCP Return or go to ED for worsening of symptoms  Reviewed expectations re: course of current medical issues. Questions answered. Outlined signs and  symptoms indicating need for more acute intervention. Patient verbalized understanding. After Visit Summary given.         Durward Parcel, FNP 12/30/19 [phone]

## 2019-12-30 NOTE — Discharge Instructions (Addendum)
Prednisone was prescribed Continue to take Tylenol as needed for pain Follow RICE instruction that is attached Follow-up with PCP Return or go to ED for worsening of symptoms

## 2020-01-12 ENCOUNTER — Ambulatory Visit (HOSPITAL_COMMUNITY): Payer: Medicare Other | Admitting: Psychiatry

## 2020-01-22 ENCOUNTER — Other Ambulatory Visit: Payer: Self-pay

## 2020-01-22 ENCOUNTER — Telehealth (INDEPENDENT_AMBULATORY_CARE_PROVIDER_SITE_OTHER): Payer: Medicare Other | Admitting: Psychiatry

## 2020-01-22 ENCOUNTER — Encounter (HOSPITAL_COMMUNITY): Payer: Self-pay | Admitting: Psychiatry

## 2020-01-22 DIAGNOSIS — F411 Generalized anxiety disorder: Secondary | ICD-10-CM

## 2020-01-22 DIAGNOSIS — F331 Major depressive disorder, recurrent, moderate: Secondary | ICD-10-CM

## 2020-01-22 MED ORDER — SERTRALINE HCL 100 MG PO TABS: 100.0000 mg | ORAL_TABLET | Freq: Every day | ORAL | 1 refills | Status: DC

## 2020-01-22 MED ORDER — ALPRAZOLAM 1 MG PO TABS: 1.0000 mg | ORAL_TABLET | Freq: Four times a day (QID) | ORAL | 2 refills | Status: DC

## 2020-01-22 NOTE — Progress Notes (Signed)
Virtual Visit via Telephone Note  I connected with Heather Wang on 01/22/20 at 11:40 AM EDT by telephone and verified that I am speaking with the correct person using two identifiers.  Location: Patient: home Provider: office   I discussed the limitations, risks, security and privacy concerns of performing an evaluation and management service by telephone and the availability of in person appointments. I also discussed with the patient that there may be a patient responsible charge related to this service. The patient expressed understanding and agreed to proceed.     I discussed the assessment and treatment plan with the patient. The patient was provided an opportunity to ask questions and all were answered. The patient agreed with the plan and demonstrated an understanding of the instructions.   The patient was advised to call back or seek an in-person evaluation if the symptoms worsen or if the condition fails to improve as anticipated.  I provided 15 minutes of non-face-to-face time during this encounter.   Diannia Ruder, MD  [place] MD/PA/NP OP Progress Note  01/22/2020 11:45 AM Heather Wang  MRN:  [id number]  Chief Complaint:  Chief Complaint    Depression; Anxiety; Follow-up     HPI: This patient is a 59 year old widowed white female who lives alone in [place].  She is on disability.  The patient returns for follow-up after 3 months regarding depression and anxiety.  She has her usual complaints about her granddaughters behavior but other than that she states that she is doing well.  Her anxiety and depressive symptoms are under good control.  She is sleeping well and she denies any thoughts of self-harm or suicide. Visit Diagnosis:    ICD-10-CM   1. Major depressive disorder, recurrent episode, moderate (HCC)  F33.1   2. Generalized anxiety disorder  F41.1     Past Psychiatric History: none  Past Medical History:  Past Medical History:  Diagnosis Date  . Anxiety    . Chronic back pain   . COPD (chronic obstructive pulmonary disease) (HCC)    Diagnosed in August 2016  . Graves disease   . Osteopetrosis   . Panic attacks   . Pneumonia   . Thyroid disease     Past Surgical History:  Procedure Laterality Date  . NEPHRECTOMY     native   . TOTAL ABDOMINAL HYSTERECTOMY    . TUBAL LIGATION      Family Psychiatric History: see below  Family History:  Family History  Problem Relation Age of Onset  . Anxiety disorder Mother   . Depression Mother   . Heart defect Other        family history   . Cancer Other        family history   . Arthritis Other        family history   . Depression Brother   . Anxiety disorder Maternal Grandmother   . Depression Maternal Grandmother     Social History:  Social History   Socioeconomic History  . Marital status: Married    Spouse name: Not on file  . Number of children: Not on file  . Years of education: 11th   . Highest education level: Not on file  Occupational History  . Occupation: unemployed   Tobacco Use  . Smoking status: Current Every Day Smoker    Packs/day: 1.00    Types: Cigarettes  . Smokeless tobacco: Never Used  Substance and Sexual Activity  . Alcohol use: No  . Drug use:  No  . Sexual activity: Never  Other Topics Concern  . Not on file  Social History Narrative  . Not on file   Social Determinants of Health   Financial Resource Strain:   . Difficulty of Paying Living Expenses: Not on file  Food Insecurity:   . Worried About Programme researcher, broadcasting/film/video in the Last Year: Not on file  . Ran Out of Food in the Last Year: Not on file  Transportation Needs:   . Lack of Transportation (Medical): Not on file  . Lack of Transportation (Non-Medical): Not on file  Physical Activity:   . Days of Exercise per Week: Not on file  . Minutes of Exercise per Session: Not on file  Stress:   . Feeling of Stress : Not on file  Social Connections:   . Frequency of Communication with Friends  and Family: Not on file  . Frequency of Social Gatherings with Friends and Family: Not on file  . Attends Religious Services: Not on file  . Active Member of Clubs or Organizations: Not on file  . Attends Banker Meetings: Not on file  . Marital Status: Not on file    Allergies:  Allergies  Allergen Reactions  . Nsaids Other (See Comments)    Kidney condition  . Sulfa Antibiotics Nausea And Vomiting    Metabolic Disorder Labs: No results found for: HGBA1C, MPG No results found for: PROLACTIN No results found for: CHOL, TRIG, HDL, CHOLHDL, VLDL, LDLCALC No results found for: TSH  Therapeutic Level Labs: No results found for: LITHIUM No results found for: VALPROATE No components found for:  CBMZ  Current Medications: Current Outpatient Medications  Medication Sig Dispense Refill  . acetaminophen (TYLENOL) 500 MG tablet Take 1,000 mg by mouth every 6 (six) hours as needed.    . ALPRAZolam (XANAX) 1 MG tablet Take 1 tablet (1 mg total) by mouth in the morning, at noon, in the evening, and at bedtime. 120 tablet 2  . budesonide-formoterol (SYMBICORT) 160-4.5 MCG/ACT inhaler Inhale 2 puffs into the lungs 2 (two) times daily.    Marland Kitchen denosumab (PROLIA) 60 MG/ML SOLN injection Inject 60 mg into the skin every 6 (six) months. Administer in upper arm, thigh, or abdomen    . sertraline (ZOLOFT) 100 MG tablet Take 1 tablet (100 mg total) by mouth daily. 90 tablet 1   No current facility-administered medications for this visit.     Musculoskeletal: Strength & Muscle Tone: within normal limits Gait & Station: normal Patient leans: N/A  Psychiatric Specialty Exam: Review of Systems  Musculoskeletal: Positive for arthralgias.  All other systems reviewed and are negative.   There were no vitals taken for this visit.There is no height or weight on file to calculate BMI.  General Appearance: NA  Eye Contact:  NA  Speech:  Clear and Coherent  Volume:  Normal  Mood:   Euthymic  Affect:  NA  Thought Process:  Goal Directed  Orientation:  Full (Time, Place, and Person)  Thought Content: Rumination   Suicidal Thoughts:  No  Homicidal Thoughts:  No  Memory:  Immediate;   Good Recent;   Good Remote;   Good  Judgement:  Good  Insight:  Fair  Psychomotor Activity:  Normal  Concentration:  Concentration: Good and Attention Span: Good  Recall:  Good  Fund of Knowledge: Good  Language: Good  Akathisia:  No  Handed:  Right  AIMS (if indicated): not done  Assets:  Communication Skills Desire  for Improvement Physical Health Resilience Social Support Talents/Skills  ADL's:  Intact  Cognition: WNL  Sleep:  Good   Screenings: GAD-7     Counselor from 08/29/2016 [facility] Counselor from 12/27/2015 in [facility] Counselor from 07/28/2015 in [facility]  Total GAD-7 Score 16 17 9     PHQ2-9     Counselor from 08/29/2016 in [facility] Counselor from 12/27/2015 in [facility] Counselor from 07/28/2015 in [facility]  PHQ-2 Total Score 6 6 1   PHQ-9 Total Score 18 16 --       Assessment and Plan: This patient is a 59 year old female with a history of depression and anxiety.  For now her mood is stable so she will continue Zoloft 100 mg daily for depression and Xanax 1 mg 4 times daily for anxiety.  She will return to see me in 3 months   , MD 01/22/2020, 11:45 AM

## 2020-01-26 ENCOUNTER — Ambulatory Visit (INDEPENDENT_AMBULATORY_CARE_PROVIDER_SITE_OTHER): Payer: Medicare Other | Admitting: Psychiatry

## 2020-01-26 ENCOUNTER — Other Ambulatory Visit: Payer: Self-pay

## 2020-01-26 DIAGNOSIS — F411 Generalized anxiety disorder: Secondary | ICD-10-CM

## 2020-01-26 DIAGNOSIS — F331 Major depressive disorder, recurrent, moderate: Secondary | ICD-10-CM | POA: Diagnosis not present
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
## 2020-02-09 ENCOUNTER — Ambulatory Visit (INDEPENDENT_AMBULATORY_CARE_PROVIDER_SITE_OTHER): Payer: Medicare Other | Admitting: Psychiatry

## 2020-02-09 ENCOUNTER — Other Ambulatory Visit: Payer: Self-pay

## 2020-02-09 DIAGNOSIS — F331 Major depressive disorder, recurrent, moderate: Secondary | ICD-10-CM

## 2020-02-09 DIAGNOSIS — F411 Generalized anxiety disorder: Secondary | ICD-10-CM
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
## 2020-02-23 ENCOUNTER — Ambulatory Visit (INDEPENDENT_AMBULATORY_CARE_PROVIDER_SITE_OTHER): Payer: Medicare Other | Admitting: Psychiatry

## 2020-02-23 ENCOUNTER — Other Ambulatory Visit: Payer: Self-pay

## 2020-02-23 DIAGNOSIS — F411 Generalized anxiety disorder: Secondary | ICD-10-CM

## 2020-02-23 DIAGNOSIS — F331 Major depressive disorder, recurrent, moderate: Secondary | ICD-10-CM
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
## 2020-03-10 ENCOUNTER — Other Ambulatory Visit: Payer: Self-pay

## 2020-03-10 ENCOUNTER — Ambulatory Visit (INDEPENDENT_AMBULATORY_CARE_PROVIDER_SITE_OTHER): Payer: Medicare Other | Admitting: Psychiatry

## 2020-03-10 DIAGNOSIS — F331 Major depressive disorder, recurrent, moderate: Secondary | ICD-10-CM

## 2020-03-10 DIAGNOSIS — F411 Generalized anxiety disorder: Secondary | ICD-10-CM | POA: Diagnosis not present
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
## 2020-04-01 ENCOUNTER — Other Ambulatory Visit: Payer: Self-pay

## 2020-04-01 ENCOUNTER — Ambulatory Visit (INDEPENDENT_AMBULATORY_CARE_PROVIDER_SITE_OTHER): Payer: Medicare Other | Admitting: Psychiatry

## 2020-04-01 DIAGNOSIS — F411 Generalized anxiety disorder: Secondary | ICD-10-CM

## 2020-04-01 DIAGNOSIS — F331 Major depressive disorder, recurrent, moderate: Secondary | ICD-10-CM | POA: Diagnosis not present
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
## 2020-04-13 ENCOUNTER — Encounter (HOSPITAL_COMMUNITY): Payer: Self-pay | Admitting: Psychiatry

## 2020-04-13 ENCOUNTER — Telehealth (INDEPENDENT_AMBULATORY_CARE_PROVIDER_SITE_OTHER): Payer: Medicare Other | Admitting: Psychiatry

## 2020-04-13 ENCOUNTER — Other Ambulatory Visit: Payer: Self-pay

## 2020-04-13 DIAGNOSIS — F331 Major depressive disorder, recurrent, moderate: Secondary | ICD-10-CM

## 2020-04-13 DIAGNOSIS — F411 Generalized anxiety disorder: Secondary | ICD-10-CM

## 2020-04-13 MED ORDER — SERTRALINE HCL 100 MG PO TABS: 100.0000 mg | ORAL_TABLET | Freq: Every day | ORAL | 1 refills | Status: DC

## 2020-04-13 MED ORDER — ALPRAZOLAM 1 MG PO TABS: 1.0000 mg | ORAL_TABLET | Freq: Four times a day (QID) | ORAL | 3 refills | Status: DC

## 2020-04-13 NOTE — Progress Notes (Signed)
Virtual Visit via Telephone Note  I connected with [name] on 04/13/20 at  9:40 AM EST by telephone and verified that I am speaking with the correct person using two identifiers.  Location: Patient: home Provider: home   I discussed the limitations, risks, security and privacy concerns of performing an evaluation and management service by telephone and the availability of in person appointments. I also discussed with the patient that there may be a patient responsible charge related to this service. The patient expressed understanding and agreed to proceed.    I discussed the assessment and treatment plan with the patient. The patient was provided an opportunity to ask questions and all were answered. The patient agreed with the plan and demonstrated an understanding of the instructions.   The patient was advised to call back or seek an in-person evaluation if the symptoms worsen or if the condition fails to improve as anticipated.  I provided 15 minutes of non-face-to-face time during this encounter.   Diannia Ruder, MD  [facility] MD/PA/NP OP Progress Note  04/13/2020 9:53 AM Heather Wang  MRN:  [id number]  Chief Complaint:  Chief Complaint    Anxiety; Depression; Follow-up     HPI: This patient is a 60 year old widowed white female who lives alone in [place].  She is on disability.  The patient returns for follow-up after 3 months regarding her depression and anxiety.  She states that she has cold symptoms and I urged her to get a coronavirus test that she has never been vaccinated.  She and a friend are staying in a motel because of the recent snow fall in her area.  She states overall her mood has been stable and she denies serious depression and the Xanax continues to help considerably with her anxiety.  She is sleeping well. Visit Diagnosis:    ICD-10-CM   1. Major depressive disorder, recurrent episode, moderate (HCC)  F33.1   2. Generalized anxiety disorder  F41.1      Past Psychiatric History: none  Past Medical History:  Past Medical History:  Diagnosis Date  . Anxiety   . Chronic back pain   . COPD (chronic obstructive pulmonary disease) (HCC)    Diagnosed in August 2016  . Graves disease   . Osteopetrosis   . Panic attacks   . Pneumonia   . Thyroid disease     Past Surgical History:  Procedure Laterality Date  . NEPHRECTOMY     native   . TOTAL ABDOMINAL HYSTERECTOMY    . TUBAL LIGATION      Family Psychiatric History: See below  Family History:  Family History  Problem Relation Age of Onset  . Anxiety disorder Mother   . Depression Mother   . Heart defect Other        family history   . Cancer Other        family history   . Arthritis Other        family history   . Depression Brother   . Anxiety disorder Maternal Grandmother   . Depression Maternal Grandmother     Social History:  Social History   Socioeconomic History  . Marital status: Married    Spouse name: Not on file  . Number of children: Not on file  . Years of education: 11th   . Highest education level: Not on file  Occupational History  . Occupation: unemployed   Tobacco Use  . Smoking status: Current Every Day Smoker  Packs/day: 1.00    Types: Cigarettes  . Smokeless tobacco: Never Used  Substance and Sexual Activity  . Alcohol use: No  . Drug use: No  . Sexual activity: Never  Other Topics Concern  . Not on file  Social History Narrative  . Not on file   Social Determinants of Health   Financial Resource Strain: Not on file  Food Insecurity: Not on file  Transportation Needs: Not on file  Physical Activity: Not on file  Stress: Not on file  Social Connections: Not on file    Allergies:  Allergies  Allergen Reactions  . Nsaids Other (See Comments)    Kidney condition  . Sulfa Antibiotics Nausea And Vomiting    Metabolic Disorder Labs: No results found for: HGBA1C, MPG No results found for: PROLACTIN No results found  for: CHOL, TRIG, HDL, CHOLHDL, VLDL, LDLCALC No results found for: TSH  Therapeutic Level Labs: No results found for: LITHIUM No results found for: VALPROATE No components found for:  CBMZ  Current Medications: Current Outpatient Medications  Medication Sig Dispense Refill  . acetaminophen (TYLENOL) 500 MG tablet Take 1,000 mg by mouth every 6 (six) hours as needed.    . ALPRAZolam (XANAX) 1 MG tablet Take 1 tablet (1 mg total) by mouth in the morning, at noon, in the evening, and at bedtime. 120 tablet 3  . budesonide-formoterol (SYMBICORT) 160-4.5 MCG/ACT inhaler Inhale 2 puffs into the lungs 2 (two) times daily.    Marland Kitchen denosumab (PROLIA) 60 MG/ML SOLN injection Inject 60 mg into the skin every 6 (six) months. Administer in upper arm, thigh, or abdomen    . sertraline (ZOLOFT) 100 MG tablet Take 1 tablet (100 mg total) by mouth daily. 90 tablet 1   No current facility-administered medications for this visit.     Musculoskeletal: Strength & Muscle Tone: within normal limits Gait & Station: normal Patient leans: N/A  Psychiatric Specialty Exam: Review of Systems  HENT: Positive for congestion.   All other systems reviewed and are negative.   There were no vitals taken for this visit.There is no height or weight on file to calculate BMI.  General Appearance: NA  Eye Contact:  NA  Speech:  Clear and Coherent  Volume:  Normal  Mood:  Euthymic  Affect:  NA  Thought Process:  Goal Directed  Orientation:  Full (Time, Place, and Person)  Thought Content: rumination  Suicidal Thoughts:  No  Homicidal Thoughts:  No  Memory:  Immediate;   Good Recent;   Good Remote;   Fair  Judgement:  Fair  Insight:  Shallow  Psychomotor Activity:  Normal  Concentration:  Concentration: Good and Attention Span: Good  Recall:  Good  Fund of Knowledge: Good  Language: Good  Akathisia:  No  Handed:  Right  AIMS (if indicated): not done  Assets:  Communication Skills Desire for  Improvement Resilience Social Support Talents/Skills  ADL's:  Intact  Cognition: WNL  Sleep:  Good   Screenings: GAD-7   Advertising copywriter from 08/29/2016 [facility] Counselor from 12/27/2015 [facility] Counselor from 07/28/2015 in [facility]  Total GAD-7 Score 16 17 9     PHQ2-9   Flowsheet Row Counselor from 08/29/2016 in [facility] Counselor from 12/27/2015 in [facility] Counselor from 07[facility]28/2015 in [facility]  PHQ-2 Total Score 6 6 1   PHQ-9 Total Score 18 16 -  Assessment and Plan: This patient is a 60 year old female with a history of depression and anxiety.  She states that her mood has been good so she will continue Zoloft 100 mg daily for depression and Xanax 1 mg 4 times daily for anxiety.  She will return to see me in 4 months   Diannia Ruder, MD 04/13/2020, 9:53 AM

## 2020-04-15 ENCOUNTER — Telehealth (HOSPITAL_COMMUNITY): Payer: Self-pay | Admitting: Psychiatry

## 2020-04-15 ENCOUNTER — Other Ambulatory Visit: Payer: Self-pay

## 2020-04-15 ENCOUNTER — Ambulatory Visit (HOSPITAL_COMMUNITY): Payer: Medicare Other | Admitting: Psychiatry

## 2020-04-15 NOTE — Telephone Encounter (Signed)
Therapist attempted to contact patient via phone for scheduled appointment.  Therapist left message indicating attempt and requesting patient call office.

## 2020-04-21 ENCOUNTER — Ambulatory Visit (INDEPENDENT_AMBULATORY_CARE_PROVIDER_SITE_OTHER): Payer: Medicare Other | Admitting: Psychiatry

## 2020-04-21 ENCOUNTER — Other Ambulatory Visit: Payer: Self-pay

## 2020-04-21 DIAGNOSIS — F411 Generalized anxiety disorder: Secondary | ICD-10-CM | POA: Diagnosis not present

## 2020-04-21 DIAGNOSIS — F331 Major depressive disorder, recurrent, moderate: Secondary | ICD-10-CM
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
## 2020-05-05 ENCOUNTER — Ambulatory Visit (HOSPITAL_COMMUNITY): Payer: Medicare Other | Admitting: Psychiatry

## 2020-05-10 ENCOUNTER — Telehealth (HOSPITAL_COMMUNITY): Payer: Self-pay | Admitting: Psychiatry

## 2020-05-10 ENCOUNTER — Ambulatory Visit (HOSPITAL_COMMUNITY): Payer: Medicare Other | Admitting: Psychiatry

## 2020-05-10 ENCOUNTER — Other Ambulatory Visit: Payer: Self-pay

## 2020-05-10 NOTE — Telephone Encounter (Signed)
Therapist attempted to contact patient via phone for scheduled appointment and received voicemail message.  Therapist left message indicating attempt and requesting patient call office. 

## 2020-05-19 ENCOUNTER — Other Ambulatory Visit: Payer: Self-pay

## 2020-05-19 ENCOUNTER — Ambulatory Visit (INDEPENDENT_AMBULATORY_CARE_PROVIDER_SITE_OTHER): Payer: Medicare Other | Admitting: Psychiatry

## 2020-05-19 DIAGNOSIS — F331 Major depressive disorder, recurrent, moderate: Secondary | ICD-10-CM | POA: Diagnosis not present

## 2020-05-19 DIAGNOSIS — F411 Generalized anxiety disorder: Secondary | ICD-10-CM
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
## 2020-06-02 ENCOUNTER — Other Ambulatory Visit: Payer: Self-pay

## 2020-06-02 ENCOUNTER — Ambulatory Visit (INDEPENDENT_AMBULATORY_CARE_PROVIDER_SITE_OTHER): Payer: Medicare Other | Admitting: Psychiatry

## 2020-06-02 DIAGNOSIS — F331 Major depressive disorder, recurrent, moderate: Secondary | ICD-10-CM

## 2020-06-02 DIAGNOSIS — F411 Generalized anxiety disorder: Secondary | ICD-10-CM | POA: Diagnosis not present
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
## 2020-06-16 ENCOUNTER — Ambulatory Visit (INDEPENDENT_AMBULATORY_CARE_PROVIDER_SITE_OTHER): Payer: Medicare Other | Admitting: Psychiatry

## 2020-06-16 ENCOUNTER — Other Ambulatory Visit: Payer: Self-pay

## 2020-06-16 DIAGNOSIS — F331 Major depressive disorder, recurrent, moderate: Secondary | ICD-10-CM

## 2020-06-16 DIAGNOSIS — F411 Generalized anxiety disorder: Secondary | ICD-10-CM | POA: Diagnosis not present
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
## 2020-06-30 ENCOUNTER — Other Ambulatory Visit: Payer: Self-pay

## 2020-06-30 ENCOUNTER — Ambulatory Visit (HOSPITAL_COMMUNITY): Payer: Medicare Other | Admitting: Psychiatry

## 2020-07-14 ENCOUNTER — Ambulatory Visit (HOSPITAL_COMMUNITY): Payer: Medicare Other | Admitting: Psychiatry

## 2020-07-28 ENCOUNTER — Ambulatory Visit (HOSPITAL_COMMUNITY): Payer: Medicare HMO | Admitting: Psychiatry

## 2020-07-29 ENCOUNTER — Telehealth (HOSPITAL_COMMUNITY): Payer: Medicare HMO | Admitting: Psychiatry

## 2020-07-29 ENCOUNTER — Telehealth (HOSPITAL_COMMUNITY): Payer: Medicare Other | Admitting: Psychiatry

## 2020-08-02 ENCOUNTER — Telehealth (HOSPITAL_COMMUNITY): Payer: Medicare HMO | Admitting: Psychiatry

## 2020-08-02 ENCOUNTER — Telehealth (HOSPITAL_COMMUNITY): Payer: Medicare Other | Admitting: Psychiatry

## 2020-08-03 ENCOUNTER — Ambulatory Visit (HOSPITAL_COMMUNITY): Payer: Medicare HMO | Admitting: Psychiatry

## 2020-08-05 ENCOUNTER — Ambulatory Visit: Payer: Medicare HMO | Admitting: Orthopedic Surgery

## 2020-08-12 ENCOUNTER — Encounter (HOSPITAL_COMMUNITY): Payer: Self-pay | Admitting: Psychiatry

## 2020-08-12 ENCOUNTER — Other Ambulatory Visit: Payer: Self-pay

## 2020-08-12 ENCOUNTER — Telehealth (INDEPENDENT_AMBULATORY_CARE_PROVIDER_SITE_OTHER): Payer: Medicare HMO | Admitting: Psychiatry

## 2020-08-12 DIAGNOSIS — F331 Major depressive disorder, recurrent, moderate: Secondary | ICD-10-CM | POA: Diagnosis not present

## 2020-08-12 DIAGNOSIS — F411 Generalized anxiety disorder: Secondary | ICD-10-CM | POA: Diagnosis not present

## 2020-08-12 MED ORDER — SERTRALINE HCL 100 MG PO TABS: 100.0000 mg | ORAL_TABLET | Freq: Every day | ORAL | 1 refills | Status: DC

## 2020-08-12 MED ORDER — ALPRAZOLAM 1 MG PO TABS: 1.0000 mg | ORAL_TABLET | Freq: Four times a day (QID) | ORAL | 3 refills | Status: DC

## 2020-08-12 NOTE — Progress Notes (Signed)
Virtual Visit via Telephone Note  I connected with Heather Wang on 08/12/20 at 11:00 AM EDT by telephone and verified that I am speaking with the correct person using two identifiers.  Location: Patient: home Provider: home visit   I discussed the limitations, risks, security and privacy concerns of performing an evaluation and management service by telephone and the availability of in person appointments. I also discussed with the patient that there may be a patient responsible charge related to this service. The patient expressed understanding and agreed to proceed.     I discussed the assessment and treatment plan with the patient. The patient was provided an opportunity to ask questions and all were answered. The patient agreed with the plan and demonstrated an understanding of the instructions.   The patient was advised to call back or seek an in-person evaluation if the symptoms worsen or if the condition fails to improve as anticipated.  I provided 15 minutes of non-face-to-face time during this encounter.   Diannia Ruder, MD  [facility] MD/PA/NP OP Progress Note  08/12/2020 11:26 AM Heather Wang  MRN:  [id number]  Chief Complaint:  Chief Complaint    Anxiety; Depression; Follow-up     HPI: This patient is a 60 year old widowed white female who lives alone in [place].  She is on disability.  The patient returns after 3 months for follow-up regarding her depression and anxiety.  She states that she does get anxious at times because of situations and her family.  Her youngest daughter is still using drugs and is currently incarcerated.  Her granddaughter is with a new boyfriend and she does not think the boyfriend has a job.  She seems to worry about everyone around her as she usually does.  Overall however she states her mood is stable and she denies serious depression.  She and her brother and family are going to the beach and she is looking forward to it.  She states that the Xanax  continues to help with her anxiety and that she is sleeping well.  She denies suicidal ideation Visit Diagnosis:    ICD-10-CM   1. Major depressive disorder, recurrent episode, moderate (HCC)  F33.1   2. Generalized anxiety disorder  F41.1     Past Psychiatric History: none  Past Medical History:  Past Medical History:  Diagnosis Date  . Anxiety   . Chronic back pain   . COPD (chronic obstructive pulmonary disease) (HCC)    Diagnosed in August 2016  . Graves disease   . Osteopetrosis   . Panic attacks   . Pneumonia   . Thyroid disease     Past Surgical History:  Procedure Laterality Date  . NEPHRECTOMY     native   . TOTAL ABDOMINAL HYSTERECTOMY    . TUBAL LIGATION      Family Psychiatric History: see below  Family History:  Family History  Problem Relation Age of Onset  . Anxiety disorder Mother   . Depression Mother   . Heart defect Other        family history   . Cancer Other        family history   . Arthritis Other        family history   . Depression Brother   . Anxiety disorder Maternal Grandmother   . Depression Maternal Grandmother     Social History:  Social History   Socioeconomic History  . Marital status: Married    Spouse name: Not on file  .  Number of children: Not on file  . Years of education: 11th   . Highest education level: Not on file  Occupational History  . Occupation: unemployed   Tobacco Use  . Smoking status: Current Every Day Smoker    Packs/day: 1.00    Types: Cigarettes  . Smokeless tobacco: Never Used  Substance and Sexual Activity  . Alcohol use: No  . Drug use: No  . Sexual activity: Never  Other Topics Concern  . Not on file  Social History Narrative  . Not on file   Social Determinants of Health   Financial Resource Strain: Not on file  Food Insecurity: Not on file  Transportation Needs: Not on file  Physical Activity: Not on file  Stress: Not on file  Social Connections: Not on file    Allergies:   Allergies  Allergen Reactions  . Nsaids Other (See Comments)    Kidney condition  . Sulfa Antibiotics Nausea And Vomiting    Metabolic Disorder Labs: No results found for: HGBA1C, MPG No results found for: PROLACTIN No results found for: CHOL, TRIG, HDL, CHOLHDL, VLDL, LDLCALC No results found for: TSH  Therapeutic Level Labs: No results found for: LITHIUM No results found for: VALPROATE No components found for:  CBMZ  Current Medications: Current Outpatient Medications  Medication Sig Dispense Refill  . acetaminophen (TYLENOL) 500 MG tablet Take 1,000 mg by mouth every 6 (six) hours as needed.    . ALPRAZolam (XANAX) 1 MG tablet Take 1 tablet (1 mg total) by mouth in the morning, at noon, in the evening, and at bedtime. 120 tablet 3  . budesonide-formoterol (SYMBICORT) 160-4.5 MCG/ACT inhaler Inhale 2 puffs into the lungs 2 (two) times daily.    Marland Kitchen denosumab (PROLIA) 60 MG/ML SOLN injection Inject 60 mg into the skin every 6 (six) months. Administer in upper arm, thigh, or abdomen    . sertraline (ZOLOFT) 100 MG tablet Take 1 tablet (100 mg total) by mouth daily. 90 tablet 1   No current facility-administered medications for this visit.     Musculoskeletal: Strength & Muscle Tone: within normal limits Gait & Station: normal Patient leans: N/A  Psychiatric Specialty Exam: Review of Systems  Psychiatric/Behavioral: The patient is nervous/anxious.   All other systems reviewed and are negative.   There were no vitals taken for this visit.There is no height or weight on file to calculate BMI.  General Appearance: NA  Eye Contact:  NA  Speech:  Clear and Coherent  Volume:  Normal  Mood:  Anxious and Euthymic  Affect:  NA  Thought Process:  Goal Directed  Orientation:  Full (Time, Place, and Person)  Thought Content: Rumination   Suicidal Thoughts:  No  Homicidal Thoughts:  No  Memory:  Immediate;   Good Recent;   Good Remote;   Good  Judgement:  Good  Insight:   Fair  Psychomotor Activity:  Normal  Concentration:  Concentration: Good and Attention Span: Good  Recall:  Good  Fund of Knowledge: Good  Language: Good  Akathisia:  No  Handed:  Right  AIMS (if indicated): not done  Assets:  Communication Skills Desire for Improvement Resilience Social Support Talents/Skills  ADL's:  Intact  Cognition: WNL  Sleep:  Good   Screenings: GAD-7   Advertising copywriter from [facility]29[facility] Counselor from 12/27/2015 [facility] Counselor from 07/28/2015 in [facility]  Total GAD-7 Score 16 17 9  PHQ2-9   Flowsheet Row Video Visit from 08/12/2020 [facility] Counselor from 08/29/2016 in [facility] Counselor from 12/27/2015 in [facility] Counselor from 07/28/2015 in [facility]  PHQ-2 Total Score 1 6 6 1   PHQ-9 Total Score -- 18 16 --    Flowsheet Row Video Visit from 08/12/2020 in [facility]  C-SSRS RISK CATEGORY No Risk       Assessment and Plan: This patient is a 60 year old female with a history of depression and anxiety.  Her mood has been stable.  She will continue Zoloft 100 mg daily for depression and Xanax 1 mg 4 times daily for anxiety.  She will return to see me in 3 months   41, MD 08/12/2020, 11:26 AM

## 2020-08-16 ENCOUNTER — Ambulatory Visit (HOSPITAL_COMMUNITY): Payer: Medicare HMO | Admitting: Psychiatry

## 2020-08-17 ENCOUNTER — Telehealth (HOSPITAL_COMMUNITY): Payer: Medicare HMO | Admitting: Psychiatry

## 2020-08-24 ENCOUNTER — Ambulatory Visit (INDEPENDENT_AMBULATORY_CARE_PROVIDER_SITE_OTHER): Payer: Medicare HMO | Admitting: Psychiatry

## 2020-08-24 ENCOUNTER — Other Ambulatory Visit: Payer: Self-pay

## 2020-08-24 DIAGNOSIS — F331 Major depressive disorder, recurrent, moderate: Secondary | ICD-10-CM

## 2020-08-24 DIAGNOSIS — F411 Generalized anxiety disorder: Secondary | ICD-10-CM
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
## 2020-08-30 ENCOUNTER — Ambulatory Visit: Payer: Medicare HMO

## 2020-08-30 ENCOUNTER — Ambulatory Visit: Payer: Medicare HMO | Admitting: Orthopedic Surgery

## 2020-08-30 ENCOUNTER — Other Ambulatory Visit: Payer: Self-pay

## 2020-08-30 ENCOUNTER — Encounter: Payer: Self-pay | Admitting: Orthopedic Surgery

## 2020-08-30 VITALS — BP 118/59 | HR 68 | Ht 64.0 in | Wt 108.0 lb

## 2020-08-30 DIAGNOSIS — M7501 Adhesive capsulitis of right shoulder: Secondary | ICD-10-CM | POA: Diagnosis not present

## 2020-08-30 DIAGNOSIS — M25511 Pain in right shoulder: Secondary | ICD-10-CM | POA: Diagnosis not present

## 2020-08-30 DIAGNOSIS — G8929 Other chronic pain: Secondary | ICD-10-CM

## 2020-08-30 NOTE — Patient Instructions (Addendum)
[phone] is phone number to call for physical therapy appointment   Adhesive Capsulitis  Adhesive capsulitis, also called frozen shoulder, causes the shoulder to become stiff and painful to move. This condition happens when there is inflammation of the tendons and ligaments that surround the shoulder joint (shoulder capsule). What are the causes? This condition may be caused by:  An injury to your shoulder joint.  Straining your shoulder.  Not moving your shoulder for a period of time. This can happen if your arm was injured or in a sling.  Long-standing conditions, such as: ? Diabetes. ? Thyroid problems. ? Heart disease. ? Stroke. ? Rheumatoid arthritis. ? Lung disease. In some cases, the cause is not known. What increases the risk? You are more likely to develop this condition if you are:  A woman.  Older than 59 years of age. What are the signs or symptoms? Symptoms of this condition include:  Pain in your shoulder when you move your arm. There may also be pain when parts of your shoulder are touched. The pain may be worse at night or when you are resting.  A sore or aching shoulder.  The inability to move your shoulder normally.  Muscle spasms. How is this diagnosed? This condition is diagnosed with a physical exam and imaging tests, such as an X-ray or MRI. How is this treated? This condition may be treated with:  Treatment of the underlying cause or condition.  Medicine. Medicine may be given to relieve pain, inflammation, or muscle spasms.  Steroid injections into the shoulder joint.  Physical therapy. This involves performing exercises to get the shoulder moving again.  Acupuncture. This is a type of treatment that involves stimulating specific points on your body by inserting thin needles through your skin.  Shoulder manipulation. This is a procedure to move the shoulder into another position. It is done after you are given a medicine to make you  fall asleep (general anesthetic). The joint may also be injected with salt water at high pressure to break down scarring.  Surgery. This may be done in severe cases when other treatments have failed. Although most people recover completely from adhesive capsulitis, some may not regain full shoulder movement. Follow these instructions at home: Managing pain, stiffness, and swelling  If directed, put ice on the injured area: ? Put ice in a plastic bag. ? Place a towel between your skin and the bag. ? Leave the ice on for 20 minutes, 2-3 times per day.  If directed, apply heat to the affected area before you exercise. Use the heat source that your health care provider recommends, such as a moist heat pack or a heating pad. ? Place a towel between your skin and the heat source. ? Leave the heat on for 20-30 minutes. ? Remove the heat if your skin turns bright red. This is especially important if you are unable to feel pain, heat, or cold. You may have a greater risk of getting burned.      General instructions  Take over-the-counter and prescription medicines only as told by your health care provider.  If you are being treated with physical therapy, follow instructions from your physical therapist.  Avoid exercises that put a lot of demand on your shoulder, such as throwing. These exercises can make pain worse.  Keep all follow-up visits as told by your health care provider. This is important. Contact a health care provider if:  You develop new symptoms.  Your symptoms  get worse. Summary  Adhesive capsulitis, also called frozen shoulder, causes the shoulder to become stiff and painful to move.  You are more likely to have this condition if you are a woman and over age 52.  It is treated with physical therapy, medicines, and sometimes surgery. This information is not intended to replace advice given to you by your health care provider. Make sure you discuss any questions you have  with your health care provider. Document Revised: 08/17/2017 Document Reviewed: 08/17/2017 Elsevier Patient Education  2021 [employer].

## 2020-08-30 NOTE — Progress Notes (Signed)
  New patient  Chief complaint pain right shoulder  The patient is 60 years old she fell out of bed 2 to 3 months ago landed on her left shoulder but comes in complaining of pain and stiffness in the right shoulder  Risk factors thyroid disease smoking not a diabetic  Review of systems shortness of breath no chest pain no numbness no tingling  Physical Exam HENT:     Head: Normocephalic and atraumatic.  Musculoskeletal:     Comments: Left shoulder: Skin is normal there is no tenderness there is full range of motion good strength  Right shoulder is tender actually over the [facility] joint nontender over the remaining portion of the shoulder however she only has 30 degrees of external rotation active and passive and then active and passive abduction less than 90  Skin is otherwise normal over the right shoulder no axillary or supraclavicular lymph node enlargement  Skin:    Capillary Refill: Capillary refill takes less than 2 seconds.  Neurological:     Mental Status: She is oriented to person, place, and time.  Psychiatric:        Mood and Affect: Mood normal.        Thought Content: Thought content normal.    X-rays of the shoulder show mild narrowing of the joint slight elevation of the humeral head relation to the inferior glenoid  Impression Encounter Diagnoses  Name Primary?  . Acute pain of right shoulder   . Adhesive capsulitis of right shoulder Yes    Recommend 3 months of physical therapy  Return if no improvement and consider volume injection

## 2020-09-06 ENCOUNTER — Ambulatory Visit: Payer: Medicare HMO | Attending: Orthopedic Surgery

## 2020-09-06 ENCOUNTER — Other Ambulatory Visit: Payer: Self-pay

## 2020-09-06 DIAGNOSIS — M25511 Pain in right shoulder: Secondary | ICD-10-CM | POA: Diagnosis present

## 2020-09-06 DIAGNOSIS — R293 Abnormal posture: Secondary | ICD-10-CM | POA: Diagnosis present

## 2020-09-07 ENCOUNTER — Ambulatory Visit (INDEPENDENT_AMBULATORY_CARE_PROVIDER_SITE_OTHER): Payer: Medicare HMO | Admitting: Psychiatry

## 2020-09-07 DIAGNOSIS — F331 Major depressive disorder, recurrent, moderate: Secondary | ICD-10-CM | POA: Diagnosis not present

## 2020-09-07 DIAGNOSIS — F411 Generalized anxiety disorder: Secondary | ICD-10-CM | POA: Diagnosis not present

## 2020-09-07 NOTE — Addendum Note (Signed)
Addended by: Florencia Reasons E on: 09/07/2020 04:01 PM   Modules accepted: Level of Service
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
## 2020-09-08 ENCOUNTER — Ambulatory Visit: Payer: Medicare HMO | Admitting: Physical Therapy

## 2020-09-08 ENCOUNTER — Other Ambulatory Visit: Payer: Self-pay

## 2020-09-08 ENCOUNTER — Encounter: Payer: Self-pay | Admitting: Physical Therapy

## 2020-09-08 DIAGNOSIS — R293 Abnormal posture: Secondary | ICD-10-CM

## 2020-09-08 DIAGNOSIS — M25511 Pain in right shoulder: Secondary | ICD-10-CM

## 2020-09-08 NOTE — Therapy (Signed)
[name] [address] [place], [place], [address] Phone: [phone]   Fax:  [phone]  Physical Therapy Treatment  Patient Details  Name: Heather Wang MRN: [id number] Date of Birth: 1961-02-27 Referring Provider (PT): Fuller Canada   Encounter Date: 09/06/2020    Past Medical History:  Diagnosis Date   Anxiety    Chronic back pain    COPD (chronic obstructive pulmonary disease) (HCC)    Diagnosed in August 2016   Graves disease    Osteopetrosis    Panic attacks    Pneumonia    Thyroid disease     Past Surgical History:  Procedure Laterality Date   NEPHRECTOMY     native    TOTAL ABDOMINAL HYSTERECTOMY     TUBAL LIGATION      There were no vitals filed for this visit.       [facility] PT Assessment - 09/07/20 1345       Assessment   Medical Diagnosis Adhesive Capsulitis of the R shoulder    Referring Provider (PT) Fuller Canada    Onset Date/Surgical Date 06/01/20    Hand Dominance Right      Balance Screen   Has the patient fallen in the past 6 months Yes    How many times? 2    Has the patient had a decrease in activity level because of a fear of falling?  No    Is the patient reluctant to leave their home because of a fear of falling?  No      Observation/Other Assessments   Observations Holds R UE in protective pattern      Sensation   Light Touch Appears Intact      ROM / Strength   AROM / PROM / Strength AROM;Strength      AROM   Overall AROM Comments Limited by pain actively    AROM Assessment Site Shoulder    Right/Left Shoulder Right    Right Shoulder Extension 33 Degrees    Right Shoulder Flexion 105 Degrees    Right Shoulder ABduction 98 Degrees    Right Shoulder Internal Rotation 70 Degrees    Right Shoulder External Rotation 38 Degrees      Strength   Overall Strength Due to pain    Overall Strength Comments Significantly limited    Strength Assessment Site Shoulder       Palpation   Palpation comment TTP at the posterior deltoid region of the R GHJ - limited lateral mobility of R GHJ capsule, TTP at R pectoral region      Special Tests    Special Tests Rotator Cuff Impingement;Biceps/Labral Tests    Rotator Cuff Impingment tests Leanord Asal test;Neer impingement test;Empty Can test;Painful Arc of Motion    Biceps/Labral tests Speeds Test      Neer Impingement test    Findings Negative      Hawkins-Kennedy test   Findings Negative      Empty Can test   Findings Unable to test      Painful Arc of Motion   Findings Unable to test      Speeds test   findings Negative      Pronated load test (SLAP)   Findings Negative                      PT Short Term Goals - 09/06/20 1430       PT SHORT TERM GOAL #1   Title Indep with inital  HEP    Time 2    Period Weeks    Status New    Target Date 09/22/20               PT Long Term Goals - 09/08/20 0941       PT LONG TERM GOAL #1   Title Indep with advanced HEP    Time 6    Period Weeks      PT LONG TERM GOAL #2   Title Patient will be able to [facility] greater than 120* scaption P/AROM    Time 6    Period Weeks    Status New      PT LONG TERM GOAL #3   Title Activer ER 60 deg+ to allow ease of donning/doffing of apparel.    Time 6    Status New                    Patient will benefit from skilled therapeutic intervention in order to improve the following deficits and impairments:  Impaired tone, Decreased range of motion, Impaired UE functional use, Increased muscle spasms, Hypomobility, Decreased strength, Pain, Decreased activity tolerance  Visit Diagnosis: Acute pain of right shoulder  Abnormal posture     Problem List Patient Active Problem List   Diagnosis Date Noted   Screening for colon cancer 01/02/2017   Cigarette nicotine dependence without complication 06/13/2016   Need for pneumococcal vaccine 06/13/2016   Thrombocytopenia (HCC)  06/06/2016   Nausea vomiting and diarrhea 06/04/2016   Colitis 06/04/2016   Hypokalemia    Hypotension    Anxiety, generalized 07/27/2015   Dyslipidemia 07/27/2015   Gastroesophageal reflux disease without esophagitis 07/27/2015   Multiple thyroid nodules 07/27/2015   Nontoxic uninodular goiter 05/14/2012   CAP (community acquired pneumonia) 01/29/2011   Hyperthyroidism 01/29/2011   COPD (chronic obstructive pulmonary disease) (HCC) 01/29/2011   Tobacco abuse 01/29/2011   Pleurisy 01/29/2011   Hyponatremia 01/29/2011   Depression 01/29/2011   Anxiety 01/29/2011   TRIGGER FINGER, RIGHT THUMB 02/02/2010   NECK PAIN, CHRONIC 04/22/2008   H N P-CERVICAL 04/08/2008   CERVICAL SPASM 04/08/2008    Levonne Spiller PT, DPT 09/08/2020, 9:48 AM  [name] [address] [place], [place], [address] Phone: [phone]   Fax:  [phone]  Name: Heather Wang MRN: [id number] Date of Birth: [birth year]

## 2020-09-08 NOTE — Therapy (Signed)
[name] [address] [place], [place], [address] Phone: [phone]   Fax:  [phone]  Physical Therapy Treatment  Patient Details  Name: Heather Wang MRN: [id number] Date of Birth: Sep 11, 1960 Referring Provider (PT): Fuller Canada   Encounter Date: 09/08/2020   PT End of Session - 09/08/20 1035     Visit Number 2    Number of Visits 12    Date for PT Re-Evaluation 10/18/20    PT Start Time 1030    PT Stop Time 1117    PT Time Calculation (min) 47 min    Activity Tolerance Patient tolerated treatment well    Behavior During Therapy [facility] for tasks assessed/performed             Past Medical History:  Diagnosis Date   Anxiety    Chronic back pain    COPD (chronic obstructive pulmonary disease) (HCC)    Diagnosed in August 2016   Graves disease    Osteopetrosis    Panic attacks    Pneumonia    Thyroid disease     Past Surgical History:  Procedure Laterality Date   NEPHRECTOMY     native    TOTAL ABDOMINAL HYSTERECTOMY     TUBAL LIGATION      There were no vitals filed for this visit.   Subjective Assessment - 09/08/20 1033     Subjective COVID-19 screen performed prior to patient entering clinic. Reports some improvement in flexion but still having pain.    Limitations Lifting    Patient Stated Goals "to be able to use that arm normally"    Currently in Pain? Yes    Pain Location Shoulder    Pain Orientation Right    Pain Descriptors / Indicators Discomfort    Pain Type Chronic pain    Pain Radiating Towards posterior shoulder,scapula    Pain Onset 1 to 4 weeks ago    Pain Frequency Constant                [facility] PT Assessment - 09/08/20 0001       Assessment   Medical Diagnosis Adhesive Capsulitis of the R shoulder    Referring Provider (PT) Fuller Canada    Onset Date/Surgical Date 06/01/20    Hand Dominance Right    Next MD Visit None                            [facility] Adult PT Treatment/Exercise - 09/08/20 0001       Shoulder Exercises: Supine   Other Supine Exercises Thoracic extension over bolster x5 reps      Shoulder Exercises: Sidelying   Other Sidelying Exercises RUE open book x10 reps      Modalities   Modalities Electrical Stimulation;Moist Heat      Moist Heat Therapy   Number Minutes Moist Heat 10 Minutes    Moist Heat Location Shoulder      Electrical Stimulation   Electrical Stimulation Location R shoulder    Electrical Stimulation Action Pre-Mod    Electrical Stimulation Parameters 80-150 hz x10 min    Electrical Stimulation Goals Pain      Manual Therapy   Manual Therapy Joint mobilization;Passive ROM    Joint Mobilization R glenohumeral mobilization G I-II A/P/I to improve joint mobility    Passive ROM PROM of R shoulder into flexion, abduction, ER, IR with holds at end range after glenohumeral mobilizations  PT Short Term Goals - 09/06/20 1430       PT SHORT TERM GOAL #1   Title Indep with inital HEP    Time 2    Period Weeks    Status New    Target Date 09/22/20               PT Long Term Goals - 09/08/20 0941       PT LONG TERM GOAL #1   Title Indep with advanced HEP    Time 6    Period Weeks      PT LONG TERM GOAL #2   Title Patient will be able to [facility] greater than 120* scaption P/AROM    Time 6    Period Weeks    Status New      PT LONG TERM GOAL #3   Title Activer ER 60 deg+ to allow ease of donning/doffing of apparel.    Time 6    Status New                   Plan - 09/08/20 1154     Clinical Impression Statement Patient's thoracic and R shoulder mobility greatly limited and max guarding noted with any active movements or transitions. Limited tolerance of thoracic extension over bolster and patient has fear of falling off EOB even as PTA was standing beside her. Patient reported posterior glenohumeral  mobilizations feeling "good." Some greater pain in posterior shoulder region but minimal ROM improvement noted in supine after glenohumeral joint mobilizations. Firm end feels and pain reported at end range flexion and ER. Normal modalities response noted following removal of the modalities.    Personal Factors and Comorbidities Behavior Pattern;Past/Current Experience;Time since onset of injury/illness/exacerbation;Social Background    Examination-Activity Limitations Reach Overhead;Carry    Stability/Clinical Decision Making Stable/Uncomplicated    Rehab Potential Good    PT Frequency 2x / week    PT Duration 6 weeks    PT Treatment/Interventions Electrical Stimulation;Neuromuscular re-education;Manual techniques;Therapeutic exercise;Therapeutic activities;Joint Manipulations;Ultrasound;Passive range of motion;Dry needling    PT Next Visit Plan Focus on thoracic extension and shoulder mobility.    Consulted and Agree with Plan of Care Patient             Patient will benefit from skilled therapeutic intervention in order to improve the following deficits and impairments:  Impaired tone, Decreased range of motion, Impaired UE functional use, Increased muscle spasms, Hypomobility, Decreased strength, Pain, Decreased activity tolerance  Visit Diagnosis: Acute pain of right shoulder  Abnormal posture     Problem List Patient Active Problem List   Diagnosis Date Noted   Screening for colon cancer 01/02/2017   Cigarette nicotine dependence without complication 06/13/2016   Need for pneumococcal vaccine 06/13/2016   Thrombocytopenia (HCC) 06/06/2016   Nausea vomiting and diarrhea 06/04/2016   Colitis 06/04/2016   Hypokalemia    Hypotension    Anxiety, generalized 07/27/2015   Dyslipidemia 07/27/2015   Gastroesophageal reflux disease without esophagitis 07/27/2015   Multiple thyroid nodules 07/27/2015   Nontoxic uninodular goiter 05/14/2012   CAP (community acquired pneumonia)  01/29/2011   Hyperthyroidism 01/29/2011   COPD (chronic obstructive pulmonary disease) (HCC) 01/29/2011   Tobacco abuse 01/29/2011   Pleurisy 01/29/2011   Hyponatremia 01/29/2011   Depression 01/29/2011   Anxiety 01/29/2011   TRIGGER FINGER, RIGHT THUMB 02/02/2010   NECK PAIN, CHRONIC 04/22/2008   H N P-CERVICAL 04/08/2008   CERVICAL SPASM 04/08/2008    Tyron Russell Nazaire Cordial, PTA 09/08/2020, 12:04  PM  [name] [address] [place], [place], [address] Phone: [phone]   Fax:  [phone]  Name: JUSTISE EHMANN MRN: [id number] Date of Birth: 1961-01-02

## 2020-09-08 NOTE — Addendum Note (Signed)
Addended by: Levonne Spiller on: 09/08/2020 09:51 AM   Modules accepted: Orders

## 2020-09-13 ENCOUNTER — Ambulatory Visit: Payer: Medicare HMO

## 2020-09-16 ENCOUNTER — Encounter: Payer: Medicare HMO | Admitting: Physical Therapy

## 2020-09-20 ENCOUNTER — Ambulatory Visit: Payer: Medicare HMO | Admitting: Physical Therapy

## 2020-09-20 ENCOUNTER — Other Ambulatory Visit: Payer: Self-pay

## 2020-09-20 ENCOUNTER — Ambulatory Visit (INDEPENDENT_AMBULATORY_CARE_PROVIDER_SITE_OTHER): Payer: Medicare HMO | Admitting: Psychiatry

## 2020-09-20 DIAGNOSIS — F331 Major depressive disorder, recurrent, moderate: Secondary | ICD-10-CM | POA: Diagnosis not present
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
## 2020-09-22 ENCOUNTER — Encounter: Payer: Medicare HMO | Admitting: Physical Therapy

## 2020-10-04 ENCOUNTER — Ambulatory Visit (HOSPITAL_COMMUNITY): Payer: Medicare HMO | Admitting: Psychiatry

## 2020-10-04 ENCOUNTER — Other Ambulatory Visit: Payer: Self-pay

## 2020-10-18 ENCOUNTER — Ambulatory Visit (INDEPENDENT_AMBULATORY_CARE_PROVIDER_SITE_OTHER): Payer: Medicare HMO | Admitting: Psychiatry

## 2020-10-18 ENCOUNTER — Other Ambulatory Visit: Payer: Self-pay

## 2020-10-18 DIAGNOSIS — F331 Major depressive disorder, recurrent, moderate: Secondary | ICD-10-CM | POA: Diagnosis not present
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
## 2020-11-01 ENCOUNTER — Ambulatory Visit (INDEPENDENT_AMBULATORY_CARE_PROVIDER_SITE_OTHER): Payer: Medicare HMO | Admitting: Psychiatry

## 2020-11-01 ENCOUNTER — Other Ambulatory Visit: Payer: Self-pay

## 2020-11-01 DIAGNOSIS — F331 Major depressive disorder, recurrent, moderate: Secondary | ICD-10-CM | POA: Diagnosis not present
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
## 2020-11-15 ENCOUNTER — Other Ambulatory Visit: Payer: Self-pay

## 2020-11-15 ENCOUNTER — Ambulatory Visit (INDEPENDENT_AMBULATORY_CARE_PROVIDER_SITE_OTHER): Payer: Medicare HMO | Admitting: Psychiatry

## 2020-11-15 DIAGNOSIS — F411 Generalized anxiety disorder: Secondary | ICD-10-CM

## 2020-11-15 DIAGNOSIS — F331 Major depressive disorder, recurrent, moderate: Secondary | ICD-10-CM
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
## 2020-11-23 ENCOUNTER — Other Ambulatory Visit: Payer: Self-pay

## 2020-11-23 ENCOUNTER — Encounter (HOSPITAL_COMMUNITY): Payer: Self-pay | Admitting: Psychiatry

## 2020-11-23 ENCOUNTER — Telehealth (INDEPENDENT_AMBULATORY_CARE_PROVIDER_SITE_OTHER): Payer: Medicare HMO | Admitting: Psychiatry

## 2020-11-23 DIAGNOSIS — F411 Generalized anxiety disorder: Secondary | ICD-10-CM | POA: Diagnosis not present

## 2020-11-23 DIAGNOSIS — F331 Major depressive disorder, recurrent, moderate: Secondary | ICD-10-CM | POA: Diagnosis not present

## 2020-11-23 MED ORDER — SERTRALINE HCL 100 MG PO TABS: 100.0000 mg | ORAL_TABLET | Freq: Every day | ORAL | 1 refills | Status: DC

## 2020-11-23 MED ORDER — ALPRAZOLAM 1 MG PO TABS: 1.0000 mg | ORAL_TABLET | Freq: Four times a day (QID) | ORAL | 3 refills | Status: DC

## 2020-11-23 NOTE — Progress Notes (Signed)
Virtual Visit via Telephone Note  I connected with Heather Wang on 11/23/20 at 10:00 AM EDT by telephone and verified that I am speaking with the correct person using two identifiers.  Location: Patient: home Provider: office   I discussed the limitations, risks, security and privacy concerns of performing an evaluation and management service by telephone and the availability of in person appointments. I also discussed with the patient that there may be a patient responsible charge related to this service. The patient expressed understanding and agreed to proceed.     I discussed the assessment and treatment plan with the patient. The patient was provided an opportunity to ask questions and all were answered. The patient agreed with the plan and demonstrated an understanding of the instructions.   The patient was advised to call back or seek an in-person evaluation if the symptoms worsen or if the condition fails to improve as anticipated.  I provided 15 minutes of non-face-to-face time during this encounter.   Diannia Ruder, MD  [facility] MD/PA/NP OP Progress Note  11/23/2020 10:22 AM Heather Wang  MRN:  [id number]  Chief Complaint:  Chief Complaint   Depression; Anxiety; Follow-up    HPI: This patient is a 60 year old widowed white female who lives alone in [place].  She is on disability.  The patient returns for follow-up after 3 months regarding her depression and anxiety.  For the most part she is doing well although she has had recent GI symptoms.  This probably is viral in nature.  She states that her granddaughter still causes her a lot of concern.  She does not always like the decisions she makes.  However at this point the granddaughter is a grown person and the patient cannot do much to control her.  She realizes this but is still difficult.  She does think the Zoloft and Xanax have helped a good deal with her depression and anxiety.  She is sleeping well and she denies  suicidal ideation. Visit Diagnosis:    ICD-10-CM   1. Major depressive disorder, recurrent episode, moderate (HCC)  F33.1     2. Generalized anxiety disorder  F41.1       Past Psychiatric History: none  Past Medical History:  Past Medical History:  Diagnosis Date   Anxiety    Chronic back pain    COPD (chronic obstructive pulmonary disease) (HCC)    Diagnosed in August 2016   Graves disease    Osteopetrosis    Panic attacks    Pneumonia    Thyroid disease     Past Surgical History:  Procedure Laterality Date   NEPHRECTOMY     native    TOTAL ABDOMINAL HYSTERECTOMY     TUBAL LIGATION      Family Psychiatric History: see below  Family History:  Family History  Problem Relation Age of Onset   Anxiety disorder Mother    Depression Mother    Heart defect Other        family history    Cancer Other        family history    Arthritis Other        family history    Depression Brother    Anxiety disorder Maternal Grandmother    Depression Maternal Grandmother     Social History:  Social History   Socioeconomic History   Marital status: Married    Spouse name: Not on file   Number of children: Not on file   Years of  education: 11th    Highest education level: Not on file  Occupational History   Occupation: unemployed   Tobacco Use   Smoking status: Every Day    Packs/day: 1.00    Types: Cigarettes   Smokeless tobacco: Never  Substance and Sexual Activity   Alcohol use: No   Drug use: No   Sexual activity: Never  Other Topics Concern   Not on file  Social History Narrative   Not on file   Social Determinants of Health   Financial Resource Strain: Not on file  Food Insecurity: Not on file  Transportation Needs: Not on file  Physical Activity: Not on file  Stress: Not on file  Social Connections: Not on file    Allergies:  Allergies  Allergen Reactions   Nsaids Other (See Comments)    Kidney condition   Sulfa Antibiotics Nausea And Vomiting     Metabolic Disorder Labs: No results found for: HGBA1C, MPG No results found for: PROLACTIN No results found for: CHOL, TRIG, HDL, CHOLHDL, VLDL, LDLCALC No results found for: TSH  Therapeutic Level Labs: No results found for: LITHIUM No results found for: VALPROATE No components found for:  CBMZ  Current Medications: Current Outpatient Medications  Medication Sig Dispense Refill   acetaminophen (TYLENOL) 500 MG tablet Take 1,000 mg by mouth every 6 (six) hours as needed.     ALPRAZolam (XANAX) 1 MG tablet Take 1 tablet (1 mg total) by mouth in the morning, at noon, in the evening, and at bedtime. 120 tablet 3   budesonide-formoterol (SYMBICORT) 160-4.5 MCG/ACT inhaler Inhale 2 puffs into the lungs 2 (two) times daily.     denosumab (PROLIA) 60 MG/ML SOLN injection Inject 60 mg into the skin every 6 (six) months. Administer in upper arm, thigh, or abdomen     sertraline (ZOLOFT) 100 MG tablet Take 1 tablet (100 mg total) by mouth daily. 90 tablet 1   No current facility-administered medications for this visit.     Musculoskeletal: Strength & Muscle Tone: na Gait & Station: na Patient leans: N/A  Psychiatric Specialty Exam: Review of Systems  Gastrointestinal:  Positive for abdominal pain.  All other systems reviewed and are negative.  There were no vitals taken for this visit.There is no height or weight on file to calculate BMI.  General Appearance: NA  Eye Contact:  NA  Speech:  Clear and Coherent  Volume:  Normal  Mood:  Euthymic  Affect:  NA  Thought Process:  Goal Directed  Orientation:  Full (Time, Place, and Person)  Thought Content: Rumination   Suicidal Thoughts:  No  Homicidal Thoughts:  No  Memory:  Immediate;   Good Recent;   Good Remote;   Good  Judgement:  Good  Insight:  Fair  Psychomotor Activity:  Normal  Concentration:  Concentration: Good and Attention Span: Good  Recall:  Good  Fund of Knowledge: Good  Language: Good  Akathisia:  No   Handed:  Right  AIMS (if indicated): not done  Assets:  Communication Skills Desire for Improvement Resilience Social Support Talents/Skills  ADL's:  Intact  Cognition: WNL  Sleep:  Good   Screenings: GAD-7    Advertising copywriter from [facility]29[facility] Counselor from 12/27/2015 in [facility] Counselor from 07/28/2015 [facility]  Total GAD-7 Score 16 17 9       PHQ2-9    Flowsheet Row Counselor [facility] Video Visit from 08/12/2020 in [facility] Counselor from 08/29/2016 in [facility] Counselor from 12/27/2015 in [facility] Counselor from 07/28/2015 in [facility]  PHQ-2 Total Score 3 1 6 6 1   PHQ-9 Total Score 10 -- 18 16 --      Flowsheet Row Video Visit from 11/23/2020 in [facility] Counselor from 09/20/2020 in [facility] Video Visit from 08/12/2020 in [facility]  C-SSRS RISK CATEGORY No Risk No Risk No Risk        Assessment and Plan: This patient is a 60 year old female with a history of depression and anxiety.  As usual she gets over involved in her granddaughter's life and gets distressed when the granddaughter does not follow her advice.  She is working on this in her therapy.  Her mood and anxiety symptoms have been stable.  She will continue Zoloft 100 mg daily for depression and Xanax 1 mg 4 times daily for anxiety.  She will return to see me in 3 months   47, MD 11/23/2020, 10:22 AM

## 2020-11-30 ENCOUNTER — Ambulatory Visit (HOSPITAL_COMMUNITY): Payer: Medicare HMO | Admitting: Psychiatry

## 2020-12-02 ENCOUNTER — Ambulatory Visit: Payer: Medicare HMO | Admitting: Orthopedic Surgery

## 2020-12-08 ENCOUNTER — Ambulatory Visit (HOSPITAL_COMMUNITY): Payer: Medicare HMO | Admitting: Psychiatry

## 2020-12-08 ENCOUNTER — Other Ambulatory Visit: Payer: Self-pay

## 2020-12-16 ENCOUNTER — Ambulatory Visit: Payer: Medicare HMO | Admitting: Orthopedic Surgery

## 2020-12-22 ENCOUNTER — Ambulatory Visit (INDEPENDENT_AMBULATORY_CARE_PROVIDER_SITE_OTHER): Payer: Medicare HMO | Admitting: Psychiatry

## 2020-12-22 ENCOUNTER — Other Ambulatory Visit: Payer: Self-pay

## 2020-12-22 DIAGNOSIS — F331 Major depressive disorder, recurrent, moderate: Secondary | ICD-10-CM | POA: Diagnosis not present
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
## 2021-01-05 ENCOUNTER — Ambulatory Visit (HOSPITAL_COMMUNITY): Payer: Medicare HMO | Admitting: Psychiatry

## 2021-01-05 ENCOUNTER — Other Ambulatory Visit: Payer: Self-pay

## 2021-01-19 ENCOUNTER — Other Ambulatory Visit: Payer: Self-pay

## 2021-01-19 ENCOUNTER — Ambulatory Visit (INDEPENDENT_AMBULATORY_CARE_PROVIDER_SITE_OTHER): Payer: Medicare HMO | Admitting: Psychiatry

## 2021-01-19 DIAGNOSIS — F411 Generalized anxiety disorder: Secondary | ICD-10-CM

## 2021-01-19 DIAGNOSIS — F331 Major depressive disorder, recurrent, moderate: Secondary | ICD-10-CM | POA: Diagnosis not present
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
## 2021-02-07 ENCOUNTER — Other Ambulatory Visit: Payer: Self-pay

## 2021-02-07 ENCOUNTER — Ambulatory Visit (INDEPENDENT_AMBULATORY_CARE_PROVIDER_SITE_OTHER): Payer: Medicare HMO | Admitting: Psychiatry

## 2021-02-07 DIAGNOSIS — F331 Major depressive disorder, recurrent, moderate: Secondary | ICD-10-CM

## 2021-02-07 DIAGNOSIS — F411 Generalized anxiety disorder: Secondary | ICD-10-CM | POA: Diagnosis not present
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
## 2021-02-09 ENCOUNTER — Telehealth (HOSPITAL_COMMUNITY): Payer: Self-pay | Admitting: *Deleted

## 2021-02-09 ENCOUNTER — Telehealth (HOSPITAL_COMMUNITY): Payer: Medicare HMO | Admitting: Psychiatry

## 2021-02-09 NOTE — Telephone Encounter (Signed)
Patient had appt for 02-09-2021 but provider not able to do appt.   Staff informed patient and appt was rescheduled.  Patient do not need refills before f/u appt.

## 2021-02-11 ENCOUNTER — Other Ambulatory Visit: Payer: Self-pay

## 2021-02-11 ENCOUNTER — Telehealth (HOSPITAL_COMMUNITY): Payer: Medicare HMO | Admitting: Psychiatry

## 2021-02-16 ENCOUNTER — Telehealth (INDEPENDENT_AMBULATORY_CARE_PROVIDER_SITE_OTHER): Payer: Medicare HMO | Admitting: Psychiatry

## 2021-02-16 ENCOUNTER — Encounter (HOSPITAL_COMMUNITY): Payer: Self-pay | Admitting: Psychiatry

## 2021-02-16 ENCOUNTER — Other Ambulatory Visit: Payer: Self-pay

## 2021-02-16 DIAGNOSIS — F411 Generalized anxiety disorder: Secondary | ICD-10-CM | POA: Diagnosis not present

## 2021-02-16 DIAGNOSIS — F331 Major depressive disorder, recurrent, moderate: Secondary | ICD-10-CM | POA: Diagnosis not present

## 2021-02-16 MED ORDER — SERTRALINE HCL 100 MG PO TABS: 100.0000 mg | ORAL_TABLET | Freq: Every day | ORAL | 1 refills | Status: DC

## 2021-02-16 MED ORDER — ALPRAZOLAM 1 MG PO TABS: 1.0000 mg | ORAL_TABLET | Freq: Four times a day (QID) | ORAL | 3 refills | Status: DC

## 2021-02-16 NOTE — Progress Notes (Signed)
Virtual Visit via Telephone Note  I connected with Heather Wang on 02/16/21 at  9:20 AM EST by telephone and verified that I am speaking with the correct person using two identifiers.  Location: Patient: home Provider: office   I discussed the limitations, risks, security and privacy concerns of performing an evaluation and management service by telephone and the availability of in person appointments. I also discussed with the patient that there may be a patient responsible charge related to this service. The patient expressed understanding and agreed to proceed.     I discussed the assessment and treatment plan with the patient. The patient was provided an opportunity to ask questions and all were answered. The patient agreed with the plan and demonstrated an understanding of the instructions.   The patient was advised to call back or seek an in-person evaluation if the symptoms worsen or if the condition fails to improve as anticipated.  I provided 12 minutes of non-face-to-face time during this encounter.   Heather Ruder, MD  [facility] MD/PA/NP OP Progress Note  02/16/2021 9:56 AM Heather Wang  MRN:  [id number]  Chief Complaint:  Chief Complaint   Depression; Anxiety; Follow-up    HPI: This patient is a 60 year old widowed white female who lives alone in [place].  She is on disability.  The patient returns after 3 months regarding her depression and anxiety.  For the most part she continues to do well.  Her granddaughter who is 53 years old recently moved back in.  She still lacks in a rather immature way and tries to hang out with people in the neighborhood who the patient thinks might be using drugs.  This is really concerning her.  However for the most part she thinks her medications have helped a good deal to help with her mood and anxiety.  She denies significant depression anxiety symptoms.  She is sleeping well and denies suicidal ideation Visit Diagnosis:    ICD-10-CM   1.  Major depressive disorder, recurrent episode, moderate (HCC)  F33.1     2. Generalized anxiety disorder  F41.1       Past Psychiatric History: none  Past Medical History:  Past Medical History:  Diagnosis Date   Anxiety    Chronic back pain    COPD (chronic obstructive pulmonary disease) (HCC)    Diagnosed in August 2016   Graves disease    Osteopetrosis    Panic attacks    Pneumonia    Thyroid disease     Past Surgical History:  Procedure Laterality Date   NEPHRECTOMY     native    TOTAL ABDOMINAL HYSTERECTOMY     TUBAL LIGATION      Family Psychiatric History: see below  Family History:  Family History  Problem Relation Age of Onset   Anxiety disorder Mother    Depression Mother    Heart defect Other        family history    Cancer Other        family history    Arthritis Other        family history    Depression Brother    Anxiety disorder Maternal Grandmother    Depression Maternal Grandmother     Social History:  Social History   Socioeconomic History   Marital status: Married    Spouse name: Not on file   Number of children: Not on file   Years of education: 11th    Highest education level: Not on  file  Occupational History   Occupation: unemployed   Tobacco Use   Smoking status: Every Day    Packs/day: 1.00    Types: Cigarettes   Smokeless tobacco: Never  Substance and Sexual Activity   Alcohol use: No   Drug use: No   Sexual activity: Never  Other Topics Concern   Not on file  Social History Narrative   Not on file   Social Determinants of Health   Financial Resource Strain: Not on file  Food Insecurity: Not on file  Transportation Needs: Not on file  Physical Activity: Not on file  Stress: Not on file  Social Connections: Not on file    Allergies:  Allergies  Allergen Reactions   Nsaids Other (See Comments)    Kidney condition   Sulfa Antibiotics Nausea And Vomiting    Metabolic Disorder Labs: No results found for:  HGBA1C, MPG No results found for: PROLACTIN No results found for: CHOL, TRIG, HDL, CHOLHDL, VLDL, LDLCALC No results found for: TSH  Therapeutic Level Labs: No results found for: LITHIUM No results found for: VALPROATE No components found for:  CBMZ  Current Medications: Current Outpatient Medications  Medication Sig Dispense Refill   acetaminophen (TYLENOL) 500 MG tablet Take 1,000 mg by mouth every 6 (six) hours as needed.     ALPRAZolam (XANAX) 1 MG tablet Take 1 tablet (1 mg total) by mouth in the morning, at noon, in the evening, and at bedtime. 120 tablet 3   budesonide-formoterol (SYMBICORT) 160-4.5 MCG/ACT inhaler Inhale 2 puffs into the lungs 2 (two) times daily.     denosumab (PROLIA) 60 MG/ML SOLN injection Inject 60 mg into the skin every 6 (six) months. Administer in upper arm, thigh, or abdomen     sertraline (ZOLOFT) 100 MG tablet Take 1 tablet (100 mg total) by mouth daily. 90 tablet 1   No current facility-administered medications for this visit.     Musculoskeletal: Strength & Muscle Tone: na Gait & Station: na Patient leans: N/A  Psychiatric Specialty Exam: Review of Systems  All other systems reviewed and are negative.  There were no vitals taken for this visit.There is no height or weight on file to calculate BMI.  General Appearance: NA  Eye Contact:  NA  Speech:  Clear and Coherent  Volume:  Normal  Mood:  Euthymic  Affect:  NA  Thought Process:  Goal Directed  Orientation:  Full (Time, Place, and Person)  Thought Content: Rumination   Suicidal Thoughts:  No  Homicidal Thoughts:  No  Memory:  Immediate;   Good Recent;   Good Remote;   Good  Judgement:  Good  Insight:  Fair  Psychomotor Activity:  Normal  Concentration:  Concentration: Good and Attention Span: Good  Recall:  Good  Fund of Knowledge: Good  Language: Good  Akathisia:  No  Handed:  Right  AIMS (if indicated): not done  Assets:  Communication Skills Desire for  Improvement Physical Health Resilience Social Support Talents/Skills  ADL's:  Intact  Cognition: WNL  Sleep:  Good   Screenings: GAD-7    Flowsheet Row Counselor from 08/29[facility]2016 [facility] Counselor from 12/27/2015 [facility] Counselor from 07/28/2015 in [facility]  Total GAD-7 Score 16 17 9       PHQ2-9    Flowsheet Row Video Visit from 02/16/2021 in [facility] Counselor from 09[facility]20/2020 in [facility] Video Visit from  08/12/2020 in [facility] Counselor from 08/29/2016 [facility] Counselor from 12/27/2015 in [facility]  PHQ-2 Total Score 1 3 1 6 6   PHQ-9 Total Score -- 10 -- 18 16      Flowsheet Row Video Visit from 02/16/2021 in [facility] Video Visit from 11/23/2020 in [facility] [facility]or from 09/20/2020 [facility]  [facility] RISK CATEGORY No Risk No Risk No Risk        Assessment and Plan: This patient is a 60 year old female with a history of depression and anxiety.  As usual she worries a lot about her granddaughter even though there is not really a whole lot she can do about it.  She is working on this in therapy.  Her mood and anxiety symptoms have been stable.  She will continue Zoloft 100 mg daily for depression and Xanax 1 mg 4 times daily for anxiety.  She will return to see me in 3 months   47, MD 02/16/2021, 9:56 AM

## 2021-02-21 ENCOUNTER — Other Ambulatory Visit: Payer: Self-pay

## 2021-02-21 ENCOUNTER — Ambulatory Visit (INDEPENDENT_AMBULATORY_CARE_PROVIDER_SITE_OTHER): Payer: Medicare HMO | Admitting: Psychiatry

## 2021-02-21 DIAGNOSIS — F331 Major depressive disorder, recurrent, moderate: Secondary | ICD-10-CM | POA: Diagnosis not present
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
## 2021-03-07 ENCOUNTER — Ambulatory Visit (INDEPENDENT_AMBULATORY_CARE_PROVIDER_SITE_OTHER): Payer: Medicare HMO | Admitting: Psychiatry

## 2021-03-07 ENCOUNTER — Other Ambulatory Visit: Payer: Self-pay

## 2021-03-07 DIAGNOSIS — F331 Major depressive disorder, recurrent, moderate: Secondary | ICD-10-CM

## 2021-03-07 DIAGNOSIS — F411 Generalized anxiety disorder: Secondary | ICD-10-CM | POA: Diagnosis not present
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
## 2021-03-23 ENCOUNTER — Ambulatory Visit (INDEPENDENT_AMBULATORY_CARE_PROVIDER_SITE_OTHER): Payer: Medicare HMO | Admitting: Psychiatry

## 2021-03-23 ENCOUNTER — Other Ambulatory Visit: Payer: Self-pay

## 2021-03-23 DIAGNOSIS — F411 Generalized anxiety disorder: Secondary | ICD-10-CM

## 2021-03-23 DIAGNOSIS — F331 Major depressive disorder, recurrent, moderate: Secondary | ICD-10-CM
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
## 2021-04-06 ENCOUNTER — Other Ambulatory Visit: Payer: Self-pay

## 2021-04-06 ENCOUNTER — Ambulatory Visit (INDEPENDENT_AMBULATORY_CARE_PROVIDER_SITE_OTHER): Payer: Medicare HMO | Admitting: Psychiatry

## 2021-04-06 DIAGNOSIS — F331 Major depressive disorder, recurrent, moderate: Secondary | ICD-10-CM | POA: Diagnosis not present
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
## 2021-04-20 ENCOUNTER — Other Ambulatory Visit: Payer: Self-pay

## 2021-04-20 ENCOUNTER — Ambulatory Visit (HOSPITAL_COMMUNITY): Payer: Medicare HMO | Admitting: Psychiatry

## 2021-04-20 ENCOUNTER — Telehealth (HOSPITAL_COMMUNITY): Payer: Self-pay | Admitting: Psychiatry

## 2021-04-20 NOTE — Telephone Encounter (Signed)
Therapist attempted to contact patient via phone for scheduled appointment and received voicemail message.  Therapist left message indicating attempt and requesting patient call office. 

## 2021-05-04 ENCOUNTER — Telehealth (HOSPITAL_COMMUNITY): Payer: Self-pay | Admitting: Psychiatry

## 2021-05-04 ENCOUNTER — Ambulatory Visit (HOSPITAL_COMMUNITY): Payer: Medicare HMO | Admitting: Psychiatry

## 2021-05-04 NOTE — Telephone Encounter (Signed)
Called to reschedule therapy appt unable to leave vm

## 2021-05-16 ENCOUNTER — Telehealth (INDEPENDENT_AMBULATORY_CARE_PROVIDER_SITE_OTHER): Payer: Medicare HMO | Admitting: Psychiatry

## 2021-05-16 ENCOUNTER — Encounter (HOSPITAL_COMMUNITY): Payer: Self-pay | Admitting: Psychiatry

## 2021-05-16 ENCOUNTER — Other Ambulatory Visit: Payer: Self-pay

## 2021-05-16 DIAGNOSIS — F411 Generalized anxiety disorder: Secondary | ICD-10-CM

## 2021-05-16 DIAGNOSIS — F331 Major depressive disorder, recurrent, moderate: Secondary | ICD-10-CM | POA: Diagnosis not present

## 2021-05-16 MED ORDER — SERTRALINE HCL 100 MG PO TABS: 100.0000 mg | ORAL_TABLET | Freq: Every day | ORAL | 1 refills | Status: DC

## 2021-05-16 MED ORDER — ALPRAZOLAM 1 MG PO TABS: 1.0000 mg | ORAL_TABLET | Freq: Four times a day (QID) | ORAL | 3 refills | Status: DC

## 2021-05-16 NOTE — Progress Notes (Signed)
Virtual Visit via Telephone Note  I connected with Heather Wang on 05/16/21 at 11:20 AM EST by telephone and verified that I am speaking with the correct person using two identifiers.  Location: Patient: home Provider: office   I discussed the limitations, risks, security and privacy concerns of performing an evaluation and management service by telephone and the availability of in person appointments. I also discussed with the patient that there may be a patient responsible charge related to this service. The patient expressed understanding and agreed to proceed.     I discussed the assessment and treatment plan with the patient. The patient was provided an opportunity to ask questions and all were answered. The patient agreed with the plan and demonstrated an understanding of the instructions.   The patient was advised to call back or seek an in-person evaluation if the symptoms worsen or if the condition fails to improve as anticipated.  I provided 15 minutes of non-face-to-face time during this encounter.   Diannia Ruder, MD  [facility] MD/PA/NP OP Progress Note  05/16/2021 11:32 AM Heather Wang  MRN:  [id number]  Chief Complaint:  Chief Complaint  Patient presents with   Depression   Follow-up   Anxiety   HPI: This patient is a 61 year old widowed white female who lives alone in [place].  She is on disability.  The patient returns for follow-up after 3 months regarding her depression and anxiety.  She states she is "depressed" but it sounds more like she is stressed living with her granddaughter and the granddaughter's boyfriend right now.  They have moved down while the granddaughter is in college.  She states her granddaughter is not helping around the house and is not paying any rent.  This is making her stressed but I reminded her it is her house and she needs to sit down the rules.  She is sleeping for the most part and denies significant depression or anxiety or thoughts of  self-harm.  She denies suicidal ideation Visit Diagnosis:    ICD-10-CM   1. Major depressive disorder, recurrent episode, moderate (HCC)  F33.1     2. Generalized anxiety disorder  F41.1       Past Psychiatric History: none  Past Medical History:  Past Medical History:  Diagnosis Date   Anxiety    Chronic back pain    COPD (chronic obstructive pulmonary disease) (HCC)    Diagnosed in August 2016   Graves disease    Osteopetrosis    Panic attacks    Pneumonia    Thyroid disease     Past Surgical History:  Procedure Laterality Date   NEPHRECTOMY     native    TOTAL ABDOMINAL HYSTERECTOMY     TUBAL LIGATION      Family Psychiatric History: see below  Family History:  Family History  Problem Relation Age of Onset   Anxiety disorder Mother    Depression Mother    Heart defect Other        family history    Cancer Other        family history    Arthritis Other        family history    Depression Brother    Anxiety disorder Maternal Grandmother    Depression Maternal Grandmother     Social History:  Social History   Socioeconomic History   Marital status: Married    Spouse name: Not on file   Number of children: Not on file  Years of education: 11th    Highest education level: Not on file  Occupational History   Occupation: unemployed   Tobacco Use   Smoking status: Every Day    Packs/day: 1.00    Types: Cigarettes   Smokeless tobacco: Never  Substance and Sexual Activity   Alcohol use: No   Drug use: No   Sexual activity: Never  Other Topics Concern   Not on file  Social History Narrative   Not on file   Social Determinants of Health   Financial Resource Strain: Not on file  Food Insecurity: Not on file  Transportation Needs: Not on file  Physical Activity: Not on file  Stress: Not on file  Social Connections: Not on file    Allergies:  Allergies  Allergen Reactions   Nsaids Other (See Comments)    Kidney condition   Sulfa  Antibiotics Nausea And Vomiting    Metabolic Disorder Labs: No results found for: HGBA1C, MPG No results found for: PROLACTIN No results found for: CHOL, TRIG, HDL, CHOLHDL, VLDL, LDLCALC No results found for: TSH  Therapeutic Level Labs: No results found for: LITHIUM No results found for: VALPROATE No components found for:  CBMZ  Current Medications: Current Outpatient Medications  Medication Sig Dispense Refill   acetaminophen (TYLENOL) 500 MG tablet Take 1,000 mg by mouth every 6 (six) hours as needed.     ALPRAZolam (XANAX) 1 MG tablet Take 1 tablet (1 mg total) by mouth in the morning, at noon, in the evening, and at bedtime. 120 tablet 3   budesonide-formoterol (SYMBICORT) 160-4.5 MCG/ACT inhaler Inhale 2 puffs into the lungs 2 (two) times daily.     denosumab (PROLIA) 60 MG/ML SOLN injection Inject 60 mg into the skin every 6 (six) months. Administer in upper arm, thigh, or abdomen     sertraline (ZOLOFT) 100 MG tablet Take 1 tablet (100 mg total) by mouth daily. 90 tablet 1   No current facility-administered medications for this visit.     Musculoskeletal: Strength & Muscle Tone: na Gait & Station: na Patient leans: N/A  Psychiatric Specialty Exam: Review of Systems  All other systems reviewed and are negative.  There were no vitals taken for this visit.There is no height or weight on file to calculate BMI.  General Appearance: NA  Eye Contact:  NA  Speech:  Clear and Coherent  Volume:  Normal  Mood:  Anxious  Affect:  NA  Thought Process:  Goal Directed  Orientation:  Full (Time, Place, and Person)  Thought Content: Rumination   Suicidal Thoughts:  No  Homicidal Thoughts:  No  Memory:  Immediate;   Good Recent;   Good Remote;   Good  Judgement:  Good  Insight:  Fair  Psychomotor Activity:  Normal  Concentration:  Concentration: Good and Attention Span: Good  Recall:  Good  Fund of Knowledge: Good  Language: Good  Akathisia:  No  Handed:  Right  AIMS  (if indicated): not done  Assets:  Communication Skills Desire for Improvement Physical Health Resilience Social Support Talents/Skills  ADL's:  Intact  Cognition: WNL  Sleep:  Good   Screenings: GAD-7    Flowsheet Row Counselor from 08/29/2016 [facility] Counselor from 12/27/2015 in [facility] Counselor from 07/28/2015 in [facility]  Total GAD-7 Score 16 17 9       PHQ2-9    Flowsheet Row Video Visit from 05/16/2021 in [facility]  Video Visit from 02/16/2021 in [facility] Counselor from 09/20/2020 in [facility] Video Visit from 08/12/2020 in [facility]  Counselor from 08/29/2016 in [facility]  PHQ-2 Total Score 1 1 3 1 6   PHQ-9 Total Score -- -- 10 -- 18      Flowsheet Row Video Visit from 05/16/2021 in [facility] Video Visit from 02/16/2021 in [facility] Video Visit from 11/23/2020 in [facility]  C-SSRS RISK CATEGORY No Risk No Risk No Risk        Assessment and Plan: This patient is a 61 year old female with a history of depression and anxiety.  She continues to struggle with the relationship with her granddaughter but other than that she is doing fairly well.  She will continue Zoloft 100 mg daily for depression and Xanax 1 mg 4 times daily for anxiety.  She will return to see me in 3 months  Collaboration of Care: Collaboration of Care: Primary Care Provider AEB chart notes available to the PCP through the epic system  Patient/Guardian was advised Release of Information must be obtained prior to any record  release in order to collaborate their care with an outside provider. Patient/Guardian was advised if they have not already done so to contact the registration department to sign all necessary forms in order for 46 to release information regarding their care.   Consent: Patient/Guardian gives verbal consent for treatment and assignment of benefits for services provided during this visit. Patient/Guardian expressed understanding and agreed to proceed.    [place], MD 05/16/2021, 11:32 AM

## 2021-05-26 ENCOUNTER — Encounter (HOSPITAL_COMMUNITY): Payer: Self-pay

## 2021-05-26 ENCOUNTER — Ambulatory Visit (INDEPENDENT_AMBULATORY_CARE_PROVIDER_SITE_OTHER): Payer: Medicare HMO | Admitting: Psychiatry

## 2021-05-26 ENCOUNTER — Other Ambulatory Visit: Payer: Self-pay

## 2021-05-26 DIAGNOSIS — F331 Major depressive disorder, recurrent, moderate: Secondary | ICD-10-CM | POA: Diagnosis not present

## 2021-05-26 DIAGNOSIS — F411 Generalized anxiety disorder: Secondary | ICD-10-CM | POA: Diagnosis not present
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
## 2021-05-26 NOTE — Plan of Care (Signed)
Pt participated in development of plan 

## 2021-06-06 ENCOUNTER — Ambulatory Visit (INDEPENDENT_AMBULATORY_CARE_PROVIDER_SITE_OTHER): Payer: Medicare HMO | Admitting: Psychiatry

## 2021-06-06 ENCOUNTER — Other Ambulatory Visit: Payer: Self-pay

## 2021-06-06 DIAGNOSIS — F411 Generalized anxiety disorder: Secondary | ICD-10-CM | POA: Diagnosis not present

## 2021-06-06 DIAGNOSIS — F331 Major depressive disorder, recurrent, moderate: Secondary | ICD-10-CM
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
## 2021-06-20 ENCOUNTER — Ambulatory Visit (INDEPENDENT_AMBULATORY_CARE_PROVIDER_SITE_OTHER): Payer: Medicare HMO | Admitting: Psychiatry

## 2021-06-20 ENCOUNTER — Other Ambulatory Visit: Payer: Self-pay

## 2021-06-20 DIAGNOSIS — F331 Major depressive disorder, recurrent, moderate: Secondary | ICD-10-CM

## 2021-06-20 DIAGNOSIS — F411 Generalized anxiety disorder: Secondary | ICD-10-CM
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
## 2021-07-06 ENCOUNTER — Ambulatory Visit (INDEPENDENT_AMBULATORY_CARE_PROVIDER_SITE_OTHER): Payer: Medicare HMO | Admitting: Psychiatry

## 2021-07-06 DIAGNOSIS — F411 Generalized anxiety disorder: Secondary | ICD-10-CM

## 2021-07-06 DIAGNOSIS — F331 Major depressive disorder, recurrent, moderate: Secondary | ICD-10-CM
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
## 2021-07-20 ENCOUNTER — Ambulatory Visit (INDEPENDENT_AMBULATORY_CARE_PROVIDER_SITE_OTHER): Payer: Medicare HMO | Admitting: Psychiatry

## 2021-07-20 DIAGNOSIS — F411 Generalized anxiety disorder: Secondary | ICD-10-CM

## 2021-07-20 DIAGNOSIS — F331 Major depressive disorder, recurrent, moderate: Secondary | ICD-10-CM
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
## 2021-07-29 ENCOUNTER — Telehealth (HOSPITAL_COMMUNITY): Payer: Medicare HMO | Admitting: Psychiatry

## 2021-08-01 ENCOUNTER — Encounter (HOSPITAL_COMMUNITY): Payer: Self-pay | Admitting: Psychiatry

## 2021-08-01 ENCOUNTER — Telehealth (INDEPENDENT_AMBULATORY_CARE_PROVIDER_SITE_OTHER): Payer: Medicare HMO | Admitting: Psychiatry

## 2021-08-01 DIAGNOSIS — F331 Major depressive disorder, recurrent, moderate: Secondary | ICD-10-CM

## 2021-08-01 DIAGNOSIS — F411 Generalized anxiety disorder: Secondary | ICD-10-CM | POA: Diagnosis not present

## 2021-08-01 MED ORDER — SERTRALINE HCL 100 MG PO TABS: 100.0000 mg | ORAL_TABLET | Freq: Every day | ORAL | 1 refills | Status: DC

## 2021-08-01 MED ORDER — ALPRAZOLAM 1 MG PO TABS: 1.0000 mg | ORAL_TABLET | Freq: Four times a day (QID) | ORAL | 3 refills | Status: DC

## 2021-08-01 NOTE — Progress Notes (Signed)
Virtual Visit via Telephone Note ? ?I connected with Heather Wang on 08/01/21 at 10:00 AM EDT by telephone and verified that I am speaking with the correct person using two identifiers. ? ?Location: ?Patient: home ?Provider: office ?  ?I discussed the limitations, risks, security and privacy concerns of performing an evaluation and management service by telephone and the availability of in person appointments. I also discussed with the patient that there may be a patient responsible charge related to this service. The patient expressed understanding and agreed to proceed. ? ? ? ?  ?I discussed the assessment and treatment plan with the patient. The patient was provided an opportunity to ask questions and all were answered. The patient agreed with the plan and demonstrated an understanding of the instructions. ?  ?The patient was advised to call back or seek an in-person evaluation if the symptoms worsen or if the condition fails to improve as anticipated. ? ?I provided 12 minutes of non-face-to-face time during this encounter. ? ? ?Diannia Ruder, MD ? ?BH MD/PA/NP OP Progress Note ? ?08/01/2021 10:26 AM ?Heather Wang  ?MRN:  [id number] ? ?Chief Complaint:  ?Chief Complaint  ?Patient presents with  ? Depression  ? Anxiety  ? Follow-up  ? ?HPI: This patient is a 61 year old widowed white female who lives with her granddaughter in [place].  She is on disability. ? ?The patient returns for follow-up after 3 months.  Overall she is doing fairly well.  She and her Daughter are getting along well.  She is having some conflicts with her brother but other than that she is doing okay.  She denies significant depression or anxiety.  She is sleeping well.  She denies thoughts of self-harm or suicidal ideation ?Visit Diagnosis:  ?  ICD-10-CM   ?1. Major depressive disorder, recurrent episode, moderate (HCC)  F33.1   ?  ?2. Generalized anxiety disorder  F41.1   ?  ? ? ?Past Psychiatric History: none ? ?Past Medical History:   ?Past Medical History:  ?Diagnosis Date  ? Anxiety   ? Chronic back pain   ? COPD (chronic obstructive pulmonary disease) (HCC)   ? Diagnosed in August 2016  ? Graves disease   ? Osteopetrosis   ? Panic attacks   ? Pneumonia   ? Thyroid disease   ?  ?Past Surgical History:  ?Procedure Laterality Date  ? NEPHRECTOMY    ? native   ? TOTAL ABDOMINAL HYSTERECTOMY    ? TUBAL LIGATION    ? ? ?Family Psychiatric History: see below ? ?Family History:  ?Family History  ?Problem Relation Age of Onset  ? Anxiety disorder Mother   ? Depression Mother   ? Heart defect Other   ?     family history   ? Cancer Other   ?     family history   ? Arthritis Other   ?     family history   ? Depression Brother   ? Anxiety disorder Maternal Grandmother   ? Depression Maternal Grandmother   ? ? ?Social History:  ?Social History  ? ?Socioeconomic History  ? Marital status: Married  ?  Spouse name: Not on file  ? Number of children: Not on file  ? Years of education: 11th   ? Highest education level: Not on file  ?Occupational History  ? Occupation: unemployed   ?Tobacco Use  ? Smoking status: Every Day  ?  Packs/day: 1.00  ?  Types: Cigarettes  ? Smokeless tobacco: Never  ?  Substance and Sexual Activity  ? Alcohol use: No  ? Drug use: No  ? Sexual activity: Never  ?Other Topics Concern  ? Not on file  ?Social History Narrative  ? Not on file  ? ?Social Determinants of Health  ? ?Financial Resource Strain: Not on file  ?Food Insecurity: Not on file  ?Transportation Needs: Not on file  ?Physical Activity: Not on file  ?Stress: Not on file  ?Social Connections: Not on file  ? ? ?Allergies:  ?Allergies  ?Allergen Reactions  ? Nsaids Other (See Comments)  ?  Kidney condition  ? Sulfa Antibiotics Nausea And Vomiting  ? ? ?Metabolic Disorder Labs: ?No results found for: HGBA1C, MPG ?No results found for: PROLACTIN ?No results found for: CHOL, TRIG, HDL, CHOLHDL, VLDL, LDLCALC ?No results found for: TSH ? ?Therapeutic Level Labs: ?No results found  for: LITHIUM ?No results found for: VALPROATE ?No components found for:  CBMZ ? ?Current Medications: ?Current Outpatient Medications  ?Medication Sig Dispense Refill  ? acetaminophen (TYLENOL) 500 MG tablet Take 1,000 mg by mouth every 6 (six) hours as needed.    ? ALPRAZolam (XANAX) 1 MG tablet Take 1 tablet (1 mg total) by mouth in the morning, at noon, in the evening, and at bedtime. 120 tablet 3  ? budesonide-formoterol (SYMBICORT) 160-4.5 MCG/ACT inhaler Inhale 2 puffs into the lungs 2 (two) times daily.    ? denosumab (PROLIA) 60 MG/ML SOLN injection Inject 60 mg into the skin every 6 (six) months. Administer in upper arm, thigh, or abdomen    ? sertraline (ZOLOFT) 100 MG tablet Take 1 tablet (100 mg total) by mouth daily. 90 tablet 1  ? ?No current facility-administered medications for this visit.  ? ? ? ?Musculoskeletal: ?Strength & Muscle Tone: na ?Gait & Station: na ?Patient leans: N/A ? ?Psychiatric Specialty Exam: ?Review of Systems  ?Musculoskeletal:  Positive for back pain.  ?All other systems reviewed and are negative.  ?There were no vitals taken for this visit.There is no height or weight on file to calculate BMI.  ?General Appearance: NA  ?Eye Contact:  NA  ?Speech:  Clear and Coherent  ?Volume:  Normal  ?Mood:  Euthymic  ?Affect:  NA  ?Thought Process:  Goal Directed  ?Orientation:  Full (Time, Place, and Person)  ?Thought Content: WDL   ?Suicidal Thoughts:  No  ?Homicidal Thoughts:  No  ?Memory:  Immediate;   Good ?Recent;   Good ?Remote;   Fair  ?Judgement:  Good  ?Insight:  Fair  ?Psychomotor Activity:  Normal  ?Concentration:  Concentration: Good and Attention Span: Good  ?Recall:  Good  ?Fund of Knowledge: Good  ?Language: Good  ?Akathisia:  No  ?Handed:  Right  ?AIMS (if indicated): not done  ?Assets:  Communication Skills ?Desire for Improvement ?Resilience ?Social Support ?Talents/Skills  ?ADL's:  Intact  ?Cognition: WNL  ?Sleep:  Good  ? ?Screenings: ?GAD-7   ? ?Flowsheet Row Counselor  [facility] Counselor from 06/20/2021 in [facility] Counselor from 06/06/2021 in [facility] Counselor from 05/26/2021 in [facility] Counselor from 08/29/2016 [facility]  ?Total GAD-7 Score 17 17 14 14 16   ? ?  ? ?PHQ2-9   ? ?Flowsheet Row Video Visit from 08/01/2021 [facility] Counselor from [facility]/26/2021 [facility] [facility] Video Visit  from 02/16/2021 [facility] Counselor from 09/20/2020 in [facility]  ?PHQ-2 Total Score 0 0 1 1 3   ?PHQ-9 Total Score -- -- -- -- 10  ? ?  ? ?Flowsheet Row Video Visit from 08/01/2021 [facility] Video Visit from 05/16/2021 in [facility] Video Visit from 02/16/2021 in [facility]  ?C-SSRS RISK CATEGORY No Risk No Risk No Risk  ? ?  ? ? ? ?Assessment and Plan: This patient is a 61 year old female with a history of depression and anxiety.  For the most part she is doing well on her current regimen.  She will continue Zoloft 100 mg daily for depression and Xanax 1 mg 4 times daily for anxiety.  She will return to see me in 4 months. ? ?Collaboration of Care: Collaboration of Care: Referral or follow-up with counselor/therapist AEB patient will continue follow-up with therapist 46 in our office ? ?Patient/Guardian was advised Release of Information must be obtained prior to any record release in order to collaborate their care with an outside provider. Patient/Guardian was  advised if they have not already done so to contact the registration department to sign all necessary forms in order for Heather Wang to release information regarding their care.  ? ?Consent: Patient/Guardian gives ver

## 2021-08-08 ENCOUNTER — Ambulatory Visit (HOSPITAL_COMMUNITY): Payer: Medicare HMO | Admitting: Psychiatry

## 2021-08-23 ENCOUNTER — Ambulatory Visit (INDEPENDENT_AMBULATORY_CARE_PROVIDER_SITE_OTHER): Payer: Medicare HMO | Admitting: Psychiatry

## 2021-08-23 DIAGNOSIS — F331 Major depressive disorder, recurrent, moderate: Secondary | ICD-10-CM | POA: Diagnosis not present

## 2021-08-23 DIAGNOSIS — F411 Generalized anxiety disorder: Secondary | ICD-10-CM | POA: Diagnosis not present
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
## 2021-09-06 ENCOUNTER — Ambulatory Visit (INDEPENDENT_AMBULATORY_CARE_PROVIDER_SITE_OTHER): Payer: Medicare HMO | Admitting: Psychiatry

## 2021-09-06 DIAGNOSIS — F331 Major depressive disorder, recurrent, moderate: Secondary | ICD-10-CM
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
## 2021-09-18 DIAGNOSIS — M25512 Pain in left shoulder: Secondary | ICD-10-CM | POA: Diagnosis present

## 2021-09-19 ENCOUNTER — Other Ambulatory Visit: Payer: Self-pay

## 2021-09-19 ENCOUNTER — Emergency Department (HOSPITAL_COMMUNITY)
Admission: EM | Admit: 2021-09-19 | Discharge: 2021-09-19 | Disposition: A | Discharge status: Home / Self Care | Payer: Medicare HMO | Attending: Emergency Medicine | Admitting: Emergency Medicine

## 2021-09-19 ENCOUNTER — Encounter (HOSPITAL_COMMUNITY): Payer: Self-pay

## 2021-09-19 DIAGNOSIS — M25512 Pain in left shoulder: Secondary | ICD-10-CM

## 2021-09-19 MED ORDER — CYCLOBENZAPRINE HCL 10 MG PO TABS
5.0000 mg | ORAL_TABLET | Freq: Once | ORAL | Status: AC
  Administered 2021-09-19: 5 mg via ORAL
  Filled 2021-09-19: qty 1

## 2021-09-19 MED ORDER — CYCLOBENZAPRINE HCL 10 MG PO TABS: 10.0000 mg | ORAL_TABLET | Freq: Two times a day (BID) | ORAL | 0 refills | Status: DC | PRN

## 2021-09-19 MED ORDER — LIDOCAINE 5 % EX PTCH
1.0000 | MEDICATED_PATCH | CUTANEOUS | Status: DC
  Administered 2021-09-19: 1 via TRANSDERMAL
  Filled 2021-09-19: qty 1

## 2021-09-19 MED ORDER — 1ST MEDX-PATCH/ LIDOCAINE 4-0.025-5-20 % EX PTCH: 1.0000 | MEDICATED_PATCH | Freq: Every day | CUTANEOUS | 0 refills | Status: AC | PRN

## 2021-09-23 ENCOUNTER — Ambulatory Visit (HOSPITAL_COMMUNITY): Payer: Medicare HMO | Admitting: Psychiatry

## 2021-09-23 ENCOUNTER — Telehealth (HOSPITAL_COMMUNITY): Payer: Self-pay | Admitting: Psychiatry

## 2021-09-23 NOTE — Telephone Encounter (Signed)
Therapist called patient for scheduled appointment and received voicemail message indicating voicemail box was not established.

## 2021-09-30 ENCOUNTER — Telehealth: Payer: Self-pay | Admitting: Orthopedic Surgery

## 2021-09-30 NOTE — Telephone Encounter (Signed)
Patient called - states she received a call and does not know why our number came up on her caller ID. Per review, no current referral or appointment noted. Aware to call back if needed.

## 2021-10-14 ENCOUNTER — Ambulatory Visit (INDEPENDENT_AMBULATORY_CARE_PROVIDER_SITE_OTHER): Payer: Medicare HMO | Admitting: Psychiatry

## 2021-10-14 DIAGNOSIS — F331 Major depressive disorder, recurrent, moderate: Secondary | ICD-10-CM

## 2021-10-14 DIAGNOSIS — F411 Generalized anxiety disorder: Secondary | ICD-10-CM
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
## 2021-10-31 ENCOUNTER — Encounter (HOSPITAL_COMMUNITY): Payer: Self-pay | Admitting: Psychiatry

## 2021-10-31 ENCOUNTER — Telehealth (INDEPENDENT_AMBULATORY_CARE_PROVIDER_SITE_OTHER): Payer: Medicare HMO | Admitting: Psychiatry

## 2021-10-31 DIAGNOSIS — F411 Generalized anxiety disorder: Secondary | ICD-10-CM

## 2021-10-31 DIAGNOSIS — F331 Major depressive disorder, recurrent, moderate: Secondary | ICD-10-CM | POA: Diagnosis not present

## 2021-10-31 MED ORDER — SERTRALINE HCL 100 MG PO TABS
100.0000 mg | ORAL_TABLET | Freq: Every day | ORAL | 1 refills | Status: DC
Start: 2021-10-31 — End: 2022-01-31

## 2021-10-31 MED ORDER — ALPRAZOLAM 1 MG PO TABS: 1.0000 mg | ORAL_TABLET | Freq: Four times a day (QID) | ORAL | 3 refills | Status: DC

## 2021-10-31 NOTE — Progress Notes (Signed)
Virtual Visit via Telephone Note  I connected with Heather Wang on 10/31/21 at  4:20 PM EDT by telephone and verified that I am speaking with the correct person using two identifiers.  Location: Patient: home Provider: home office   I discussed the limitations, risks, security and privacy concerns of performing an evaluation and management service by telephone and the availability of in person appointments. I also discussed with the patient that there may be a patient responsible charge related to this service. The patient expressed understanding and agreed to proceed.      I discussed the assessment and treatment plan with the patient. The patient was provided an opportunity to ask questions and all were answered. The patient agreed with the plan and demonstrated an understanding of the instructions.   The patient was advised to call back or seek an in-person evaluation if the symptoms worsen or if the condition fails to improve as anticipated.  I provided 15 minutes of non-face-to-face time during this encounter.   Diannia Ruder, MD  [facility] MD/PA/NP OP Progress Note  10/31/2021 4:53 PM Heather Wang  MRN:  [id number]  Chief Complaint:  Chief Complaint  Patient presents with   Depression   Anxiety   Follow-up   HPI: Patient is a 61 year old widowed white female who lives with her granddaughter in [place].  She is on disability.  The patient returns for follow-up after 3 months.  For the most part she is doing okay.  She is having some conflicts with her brother.  However she denies significant depression or anxiety and feels that the medications are working well for her.  She states she went a couple of months earlier in the year without picking up the Xanax and this was confirmed by the PDMP report.  She states the pharmacy never called her to let her know what was available.  I explained that it is available every 30 days.  She denies any thoughts of self-harm or suicidal  ideation. Visit Diagnosis:    ICD-10-CM   1. Major depressive disorder, recurrent episode, moderate (HCC)  F33.1     2. Generalized anxiety disorder  F41.1       Past Psychiatric History: none  Past Medical History:  Past Medical History:  Diagnosis Date   Anxiety    Chronic back pain    COPD (chronic obstructive pulmonary disease) (HCC)    Diagnosed in August 2016   Graves disease    Osteopetrosis    Panic attacks    Pneumonia    Thyroid disease     Past Surgical History:  Procedure Laterality Date   NEPHRECTOMY     native    TOTAL ABDOMINAL HYSTERECTOMY     TUBAL LIGATION      Family Psychiatric History: see below  Family History:  Family History  Problem Relation Age of Onset   Anxiety disorder Mother    Depression Mother    Heart defect Other        family history    Cancer Other        family history    Arthritis Other        family history    Depression Brother    Anxiety disorder Maternal Grandmother    Depression Maternal Grandmother     Social History:  Social History   Socioeconomic History   Marital status: Married    Spouse name: Not on file   Number of children: Not on file   Years  of education: 11th    Highest education level: Not on file  Occupational History   Occupation: unemployed   Tobacco Use   Smoking status: Every Day    Packs/day: 1.00    Types: Cigarettes   Smokeless tobacco: Never  Substance and Sexual Activity   Alcohol use: No   Drug use: No   Sexual activity: Never  Other Topics Concern   Not on file  Social History Narrative   Not on file   Social Determinants of Health   Financial Resource Strain: Not on file  Food Insecurity: Not on file  Transportation Needs: Not on file  Physical Activity: Not on file  Stress: Not on file  Social Connections: Not on file    Allergies:  Allergies  Allergen Reactions   Nsaids Other (See Comments)    Kidney condition   Sulfa Antibiotics Nausea And Vomiting     Metabolic Disorder Labs: No results found for: "HGBA1C", "MPG" No results found for: "PROLACTIN" No results found for: "CHOL", "TRIG", "HDL", "CHOLHDL", "VLDL", "LDLCALC" No results found for: "TSH"  Therapeutic Level Labs: No results found for: "LITHIUM" No results found for: "VALPROATE" No results found for: "CBMZ"  Current Medications: Current Outpatient Medications  Medication Sig Dispense Refill   acetaminophen (TYLENOL) 500 MG tablet Take 1,000 mg by mouth every 6 (six) hours as needed.     ALPRAZolam (XANAX) 1 MG tablet Take 1 tablet (1 mg total) by mouth in the morning, at noon, in the evening, and at bedtime. 120 tablet 3   budesonide-formoterol (SYMBICORT) 160-4.5 MCG/ACT inhaler Inhale 2 puffs into the lungs 2 (two) times daily.     cyclobenzaprine (FLEXERIL) 10 MG tablet Take 1 tablet (10 mg total) by mouth 2 (two) times daily as needed for muscle spasms. 20 tablet 0   denosumab (PROLIA) 60 MG/ML SOLN injection Inject 60 mg into the skin every 6 (six) months. Administer in upper arm, thigh, or abdomen     sertraline (ZOLOFT) 100 MG tablet Take 1 tablet (100 mg total) by mouth daily. 90 tablet 1   No current facility-administered medications for this visit.     Musculoskeletal: Strength & Muscle Tone: na Gait & Station: na Patient leans: N/A  Psychiatric Specialty Exam: Review of Systems  All other systems reviewed and are negative.   There were no vitals taken for this visit.There is no height or weight on file to calculate BMI.  General Appearance: NA  Eye Contact:  NA  Speech:  Clear and Coherent  Volume:  Normal  Mood:  Euthymic  Affect:  NA  Thought Process:  Goal Directed  Orientation:  Full (Time, Place, and Person)  Thought Content: WDL   Suicidal Thoughts:  No  Homicidal Thoughts:  No  Memory:  Immediate;   Good Recent;   Good Remote;   Good  Judgement:  Good  Insight:  Fair  Psychomotor Activity:  Normal  Concentration:  Concentration:  Good and Attention Span: Good  Recall:  Good  Fund of Knowledge: Fair  Language: Good  Akathisia:  No  Handed:  Right  AIMS (if indicated): not done  Assets:  Communication Skills Desire for Improvement Physical Health Resilience Social Support Talents/Skills  ADL's:  Intact  Cognition: WNL  Sleep:  Good   Screenings: GAD-7    Advertising copywriter from [facility] Counselor from 07/06/2021 [facility] Counselor from 06/20/2021 [facility] Counselor from  06/06/2021 [facility] Counselor from 05/26/2021 in [facility]  Total GAD-7 Score 15 17 17 14 14       PHQ2-9    Flowsheet Row Video Visit from 10/31/2021 [facility] Video Visit from 08/01/2021 in [facility] Counselor from 05[facility]26/2021 in [facility] Video Visit from 05[facility]16/2021 in [facility] Video Visit from 02/16/2021 in [facility]  PHQ-2 Total Score 1 0 0 1 1      Flowsheet Row Video Visit from 10/31/2021 in [facility] ED from 09/19/2021 in [facility]PART[facility] Video Visit from 08/01/2021 [facility]  [facility] RISK CATEGORY No Risk No Risk No Risk        Assessment and Plan: Patient is a 61 year old female with a history of depression and anxiety.  She continues to do well on her current regimen.  She will continue Zoloft 100 mg daily for depression and Xanax 1 mg 4 times daily for anxiety.  She will return to see me in 3 months.  Collaboration of Care: Collaboration of Care: Referral or  follow-up with counselor/therapist AEB patient will continue therapy with 67 in our office  Patient/Guardian was advised Release of Information must be obtained prior to any record release in order to collaborate their care with an outside provider. Patient/Guardian was advised if they have not already done so to contact the registration department to sign all necessary forms in order for Florencia Reasons to release information regarding their care.   Consent: Patient/Guardian gives verbal consent for treatment and assignment of benefits for services provided during this visit. Patient/Guardian expressed understanding and agreed to proceed.    [place], MD 10/31/2021, 4:53 PM

## 2021-11-04 ENCOUNTER — Ambulatory Visit (INDEPENDENT_AMBULATORY_CARE_PROVIDER_SITE_OTHER): Payer: Medicare HMO | Admitting: Psychiatry

## 2021-11-04 DIAGNOSIS — F331 Major depressive disorder, recurrent, moderate: Secondary | ICD-10-CM | POA: Diagnosis not present
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
## 2021-11-25 ENCOUNTER — Ambulatory Visit (INDEPENDENT_AMBULATORY_CARE_PROVIDER_SITE_OTHER): Payer: Medicare HMO | Admitting: Psychiatry

## 2021-11-25 DIAGNOSIS — F419 Anxiety disorder, unspecified: Secondary | ICD-10-CM | POA: Diagnosis not present

## 2021-11-25 DIAGNOSIS — F331 Major depressive disorder, recurrent, moderate: Secondary | ICD-10-CM | POA: Diagnosis not present
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
## 2021-11-29 ENCOUNTER — Telehealth (HOSPITAL_COMMUNITY): Payer: Medicare HMO | Admitting: Psychiatry

## 2021-12-15 ENCOUNTER — Telehealth (HOSPITAL_COMMUNITY): Payer: Self-pay

## 2021-12-30 ENCOUNTER — Other Ambulatory Visit (HOSPITAL_COMMUNITY): Payer: Self-pay | Admitting: Adult Health Nurse Practitioner

## 2021-12-30 DIAGNOSIS — Z1231 Encounter for screening mammogram for malignant neoplasm of breast: Secondary | ICD-10-CM

## 2022-01-02 ENCOUNTER — Ambulatory Visit (HOSPITAL_COMMUNITY)
Admission: RE | Admit: 2022-01-02 | Discharge: 2022-01-02 | Disposition: A | Discharge status: Home / Self Care | Payer: Medicare HMO | Source: Ambulatory Visit | Attending: Adult Health Nurse Practitioner | Admitting: Adult Health Nurse Practitioner

## 2022-01-02 ENCOUNTER — Ambulatory Visit (INDEPENDENT_AMBULATORY_CARE_PROVIDER_SITE_OTHER): Payer: Medicare HMO | Admitting: Psychiatry

## 2022-01-02 DIAGNOSIS — F411 Generalized anxiety disorder: Secondary | ICD-10-CM | POA: Diagnosis not present

## 2022-01-02 DIAGNOSIS — Z1231 Encounter for screening mammogram for malignant neoplasm of breast: Secondary | ICD-10-CM | POA: Diagnosis present

## 2022-01-02 DIAGNOSIS — F331 Major depressive disorder, recurrent, moderate: Secondary | ICD-10-CM | POA: Diagnosis not present
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
## 2022-01-12 NOTE — Telephone Encounter (Signed)
error 

## 2022-01-25 ENCOUNTER — Telehealth (HOSPITAL_COMMUNITY): Payer: Medicare HMO | Admitting: Psychiatry

## 2022-01-31 ENCOUNTER — Telehealth (INDEPENDENT_AMBULATORY_CARE_PROVIDER_SITE_OTHER): Payer: Medicare HMO | Admitting: Psychiatry

## 2022-01-31 ENCOUNTER — Encounter (HOSPITAL_COMMUNITY): Payer: Self-pay | Admitting: Psychiatry

## 2022-01-31 DIAGNOSIS — F331 Major depressive disorder, recurrent, moderate: Secondary | ICD-10-CM

## 2022-01-31 DIAGNOSIS — F411 Generalized anxiety disorder: Secondary | ICD-10-CM

## 2022-01-31 MED ORDER — SERTRALINE HCL 100 MG PO TABS
100.0000 mg | ORAL_TABLET | Freq: Every day | ORAL | 1 refills | Status: DC
Start: 2022-01-31 — End: 2022-05-04

## 2022-01-31 MED ORDER — ALPRAZOLAM 1 MG PO TABS
1.0000 mg | ORAL_TABLET | Freq: Four times a day (QID) | ORAL | 3 refills | Status: DC
Start: 2022-01-31 — End: 2022-05-04

## 2022-01-31 NOTE — Progress Notes (Signed)
Virtual Visit via Telephone Note  I connected with Heather Wang on 01/31/22 at  1:00 PM EST by telephone and verified that I am speaking with the correct person using two identifiers.  Location: Patient: home Provider: office   I discussed the limitations, risks, security and privacy concerns of performing an evaluation and management service by telephone and the availability of in person appointments. I also discussed with the patient that there may be a patient responsible charge related to this service. The patient expressed understanding and agreed to proceed.      I discussed the assessment and treatment plan with the patient. The patient was provided an opportunity to ask questions and all were answered. The patient agreed with the plan and demonstrated an understanding of the instructions.   The patient was advised to call back or seek an in-person evaluation if the symptoms worsen or if the condition fails to improve as anticipated.  I provided 20 minutes of non-face-to-face time during this encounter.   Levonne Spiller, MD  [facility] MD/PA/NP OP Progress Note  01/31/2022 1:19 PM Heather Wang  MRN:  [id number]  Chief Complaint:  Chief Complaint  Patient presents with   Depression   Anxiety   Follow-up   HPI: This patient is a 61 year old widowed white female who lives with her granddaughter and granddaughter's boyfriend in [place].  She is on disability.  The patient returns for follow-up after 3 months.  For the most part she is doing okay.  She and her granddaughter are not getting along.  She states her granddaughter dropped out of college and now she is very worried about her future.  Obviously she cannot for someone else to do what she would like them to do.  She denies significant depression thoughts of self-harm or suicide or significant anxiety.  She still thinks her medications have been helpful Visit Diagnosis:    ICD-10-CM   1. Major depressive disorder, recurrent  episode, moderate (HCC)  F33.1     2. Generalized anxiety disorder  F41.1       Past Psychiatric History: none  Past Medical History:  Past Medical History:  Diagnosis Date   Anxiety    Chronic back pain    COPD (chronic obstructive pulmonary disease) (HCC)    Diagnosed in August 2016   Graves disease    Osteopetrosis    Panic attacks    Pneumonia    Thyroid disease     Past Surgical History:  Procedure Laterality Date   NEPHRECTOMY     native    TOTAL ABDOMINAL HYSTERECTOMY     TUBAL LIGATION      Family Psychiatric History: See below  Family History:  Family History  Problem Relation Age of Onset   Anxiety disorder Mother    Depression Mother    Heart defect Other        family history    Cancer Other        family history    Arthritis Other        family history    Depression Brother    Anxiety disorder Maternal Grandmother    Depression Maternal Grandmother     Social History:  Social History   Socioeconomic History   Marital status: Married    Spouse name: Not on file   Number of children: Not on file   Years of education: 11th    Highest education level: Not on file  Occupational History   Occupation: unemployed  Tobacco Use   Smoking status: Every Day    Packs/day: 1.00    Types: Cigarettes   Smokeless tobacco: Never  Substance and Sexual Activity   Alcohol use: No   Drug use: No   Sexual activity: Never  Other Topics Concern   Not on file  Social History Narrative   Not on file   Social Determinants of Health   Financial Resource Strain: Not on file  Food Insecurity: Not on file  Transportation Needs: Not on file  Physical Activity: Not on file  Stress: Not on file  Social Connections: Not on file    Allergies:  Allergies  Allergen Reactions   Nsaids Other (See Comments)    Kidney condition   Sulfa Antibiotics Nausea And Vomiting    Metabolic Disorder Labs: No results found for: "HGBA1C", "MPG" No results found for:  "PROLACTIN" No results found for: "CHOL", "TRIG", "HDL", "CHOLHDL", "VLDL", "LDLCALC" No results found for: "TSH"  Therapeutic Level Labs: No results found for: "LITHIUM" No results found for: "VALPROATE" No results found for: "CBMZ"  Current Medications: Current Outpatient Medications  Medication Sig Dispense Refill   acetaminophen (TYLENOL) 500 MG tablet Take 1,000 mg by mouth every 6 (six) hours as needed.     ALPRAZolam (XANAX) 1 MG tablet Take 1 tablet (1 mg total) by mouth in the morning, at noon, in the evening, and at bedtime. 120 tablet 3   budesonide-formoterol (SYMBICORT) 160-4.5 MCG/ACT inhaler Inhale 2 puffs into the lungs 2 (two) times daily.     cyclobenzaprine (FLEXERIL) 10 MG tablet Take 1 tablet (10 mg total) by mouth 2 (two) times daily as needed for muscle spasms. 20 tablet 0   denosumab (PROLIA) 60 MG/ML SOLN injection Inject 60 mg into the skin every 6 (six) months. Administer in upper arm, thigh, or abdomen     sertraline (ZOLOFT) 100 MG tablet Take 1 tablet (100 mg total) by mouth daily. 90 tablet 1   No current facility-administered medications for this visit.     Musculoskeletal: Strength & Muscle Tone: na Gait & Station: na Patient leans: N/A  Psychiatric Specialty Exam: Review of Systems  Musculoskeletal:  Positive for back pain.  All other systems reviewed and are negative.   There were no vitals taken for this visit.There is no height or weight on file to calculate BMI.  General Appearance: NA  Eye Contact:  NA  Speech:  Clear and Coherent  Volume:  Normal  Mood:  Euthymic  Affect:  NA  Thought Process:  Goal Directed  Orientation:  Full (Time, Place, and Person)  Thought Content: WDL   Suicidal Thoughts:  No  Homicidal Thoughts:  No  Memory:  Immediate;   Good Recent;   Good Remote;   NA  Judgement:  Good  Insight:  Fair  Psychomotor Activity:  Normal  Concentration:  Concentration: Good and Attention Span: Good  Recall:  Good  Fund  of Knowledge: Good  Language: Good  Akathisia:  No  Handed:  Right  AIMS (if indicated): not done  Assets:  Communication Skills Desire for Improvement Resilience Social Support Talents/Skills  ADL's:  Intact  Cognition: WNL  Sleep:  Good   Screenings: GAD-7    Health and safety inspector from 09/06/2021 in [facility] from 07/06/2021 in [facility] from 06/20/2021 in [facility] from 06/06/2021 in [facility] from 05/26/2021 in [place]  Total GAD-7 Score 15 17 17 14 14       PHQ2-9    Flowsheet Row Video Visit from 10/31/2021 in [facility] Video Visit from 08/01/2021 in [facility] from 05/26/2021 in [place] Video Visit from 05/16/2021 in [facility] Video Visit from 02/16/2021 in [facility]  PHQ-2 Total Score 1 0 0 1 1      Flowsheet Row Video Visit from 10/31/2021 in [place] [facility] from 09/19/2021 in [place] Video Visit from 08/01/2021 in [place] No Risk No Risk No Risk        Assessment and Plan: This patient is a 61 year old female with a history of depression and anxiety.  She continues to do well on her current regimen.  She will continue Zoloft 100 mg daily for depression and Xanax 1 mg 4 times daily for anxiety.  She will return to see me in 3 months  Collaboration of Care: Collaboration of Care: Referral or follow-up with counselor/therapist AEB patient will continue therapy  with Maurice Small in our office  Patient/Guardian was advised Release of Information must be obtained prior to any record release in order to collaborate their care with an outside provider. Patient/Guardian was advised if they have not already done so to contact the registration department to sign all necessary forms in order for [place] to release information regarding their care.   Consent: Patient/Guardian gives verbal consent for treatment and assignment of benefits for services provided during this visit. Patient/Guardian expressed understanding and agreed to proceed.    Levonne Spiller, MD 01/31/2022, 1:19 PM

## 2022-02-02 ENCOUNTER — Ambulatory Visit (HOSPITAL_COMMUNITY): Payer: Medicare HMO | Admitting: Psychiatry

## 2022-02-08 ENCOUNTER — Telehealth (HOSPITAL_COMMUNITY): Payer: Self-pay | Admitting: Psychiatry

## 2022-02-08 ENCOUNTER — Encounter (HOSPITAL_COMMUNITY): Payer: Self-pay

## 2022-02-08 ENCOUNTER — Ambulatory Visit (HOSPITAL_COMMUNITY): Payer: Medicare HMO | Admitting: Psychiatry

## 2022-02-08 NOTE — Telephone Encounter (Signed)
Therapist attempted to contact patient via phone for scheduled appointment.  Therapist received voicemail message indicating voicemail box has not been set up.

## 2022-04-05 ENCOUNTER — Ambulatory Visit (INDEPENDENT_AMBULATORY_CARE_PROVIDER_SITE_OTHER): Payer: Medicare HMO | Admitting: Psychiatry

## 2022-04-05 DIAGNOSIS — F411 Generalized anxiety disorder: Secondary | ICD-10-CM
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
## 2022-05-04 ENCOUNTER — Encounter (HOSPITAL_COMMUNITY): Payer: Self-pay | Admitting: Psychiatry

## 2022-05-04 ENCOUNTER — Telehealth (INDEPENDENT_AMBULATORY_CARE_PROVIDER_SITE_OTHER): Payer: Medicare HMO | Admitting: Psychiatry

## 2022-05-04 DIAGNOSIS — F411 Generalized anxiety disorder: Secondary | ICD-10-CM

## 2022-05-04 DIAGNOSIS — F331 Major depressive disorder, recurrent, moderate: Secondary | ICD-10-CM | POA: Diagnosis not present

## 2022-05-04 MED ORDER — SERTRALINE HCL 100 MG PO TABS
100.0000 mg | ORAL_TABLET | Freq: Every day | ORAL | 1 refills | Status: DC
Start: 2022-05-04 — End: 2022-08-02

## 2022-05-04 MED ORDER — ALPRAZOLAM 1 MG PO TABS
1.0000 mg | ORAL_TABLET | Freq: Four times a day (QID) | ORAL | 0 refills | Status: DC
Start: 2022-05-04 — End: 2022-07-05

## 2022-05-04 NOTE — Progress Notes (Signed)
Virtual Visit via Telephone Note  I connected with Heather Wang on 05/04/22 at  1:00 PM EST by telephone and verified that I am speaking with the correct person using two identifiers.  Location: Patient: home Provider: office   I discussed the limitations, risks, security and privacy concerns of performing an evaluation and management service by telephone and the availability of in person appointments. I also discussed with the patient that there may be a patient responsible charge related to this service. The patient expressed understanding and agreed to proceed.     I discussed the assessment and treatment plan with the patient. The patient was provided an opportunity to ask questions and all were answered. The patient agreed with the plan and demonstrated an understanding of the instructions.   The patient was advised to call back or seek an in-person evaluation if the symptoms worsen or if the condition fails to improve as anticipated.  I provided 15 minutes of non-face-to-face time during this encounter.   Levonne Spiller, MD  [facility] MD/PA/NP OP Progress Note  05/04/2022 1:18 PM Heather Wang  MRN:  [id number]  Chief Complaint:  Chief Complaint  Patient presents with   Depression   Anxiety   Follow-up   HPI: This patient is a 62 year old widowed white female who lives with her granddaughter and granddaughter's boyfriend in [place].  She is on disability.  The patient states that she is doing about the same.  She denies serious depression or thoughts of self-harm or suicide but states she is very stressed.  She still argues a lot with her granddaughter and granddaughter's boyfriend.  They are living in her house but often are not working and do not help her financially.  I reminded her that the house belongs to her and she does not need to let people stay there for free.  She still thinks her medications have been helpful although she claims she does not have refills on Xanax which  adamantly is not true.  I asked her to check with the pharmacy.  I told her I would send in 30-day supply just in case but she really needs to check with the pharmacy about this Visit Diagnosis:    ICD-10-CM   1. Generalized anxiety disorder  F41.1     2. Major depressive disorder, recurrent episode, moderate (HCC)  F33.1       Past Psychiatric History: none  Past Medical History:  Past Medical History:  Diagnosis Date   Anxiety    Chronic back pain    COPD (chronic obstructive pulmonary disease) (HCC)    Diagnosed in August 2016   Graves disease    Osteopetrosis    Panic attacks    Pneumonia    Thyroid disease     Past Surgical History:  Procedure Laterality Date   NEPHRECTOMY     native    TOTAL ABDOMINAL HYSTERECTOMY     TUBAL LIGATION      Family Psychiatric History: See below  Family History:  Family History  Problem Relation Age of Onset   Anxiety disorder Mother    Depression Mother    Heart defect Other        family history    Cancer Other        family history    Arthritis Other        family history    Depression Brother    Anxiety disorder Maternal Grandmother    Depression Maternal Grandmother  Social History:  Social History   Socioeconomic History   Marital status: Married    Spouse name: Not on file   Number of children: Not on file   Years of education: 11th    Highest education level: Not on file  Occupational History   Occupation: unemployed   Tobacco Use   Smoking status: Every Day    Packs/day: 1.00    Types: Cigarettes   Smokeless tobacco: Never  Substance and Sexual Activity   Alcohol use: No   Drug use: No   Sexual activity: Never  Other Topics Concern   Not on file  Social History Narrative   Not on file   Social Determinants of Health   Financial Resource Strain: Not on file  Food Insecurity: Not on file  Transportation Needs: Not on file  Physical Activity: Not on file  Stress: Not on file  Social  Connections: Not on file    Allergies:  Allergies  Allergen Reactions   Nsaids Other (See Comments)    Kidney condition   Sulfa Antibiotics Nausea And Vomiting    Metabolic Disorder Labs: No results found for: "HGBA1C", "MPG" No results found for: "PROLACTIN" No results found for: "CHOL", "TRIG", "HDL", "CHOLHDL", "VLDL", "LDLCALC" No results found for: "TSH"  Therapeutic Level Labs: No results found for: "LITHIUM" No results found for: "VALPROATE" No results found for: "CBMZ"  Current Medications: Current Outpatient Medications  Medication Sig Dispense Refill   acetaminophen (TYLENOL) 500 MG tablet Take 1,000 mg by mouth every 6 (six) hours as needed.     ALPRAZolam (XANAX) 1 MG tablet Take 1 tablet (1 mg total) by mouth in the morning, at noon, in the evening, and at bedtime. 120 tablet 0   budesonide-formoterol (SYMBICORT) 160-4.5 MCG/ACT inhaler Inhale 2 puffs into the lungs 2 (two) times daily.     cyclobenzaprine (FLEXERIL) 10 MG tablet Take 1 tablet (10 mg total) by mouth 2 (two) times daily as needed for muscle spasms. 20 tablet 0   denosumab (PROLIA) 60 MG/ML SOLN injection Inject 60 mg into the skin every 6 (six) months. Administer in upper arm, thigh, or abdomen     sertraline (ZOLOFT) 100 MG tablet Take 1 tablet (100 mg total) by mouth daily. 90 tablet 1   No current facility-administered medications for this visit.     Musculoskeletal: Strength & Muscle Tone: na Gait & Station: na Patient leans: N/A  Psychiatric Specialty Exam: Review of Systems  Psychiatric/Behavioral:  The patient is nervous/anxious.   All other systems reviewed and are negative.   There were no vitals taken for this visit.There is no height or weight on file to calculate BMI.  General Appearance: NA  Eye Contact:  NA  Speech:  Clear and Coherent  Volume:  Normal  Mood:  Anxious  Affect:  NA  Thought Process:  Goal Directed  Orientation:  Full (Time, Place, and Person)  Thought  Content: WDL   Suicidal Thoughts:  No  Homicidal Thoughts:  No  Memory:  Immediate;   Good Recent;   Good Remote;   NA  Judgement:  Fair  Insight:  Lacking  Psychomotor Activity:  Decreased  Concentration:  Concentration: Fair and Attention Span: Fair  Recall:  [employer] of Knowledge: Fair  Language: Good  Akathisia:  No  Handed:  Right  AIMS (if indicated): not done  Assets:  Communication Skills Desire for Improvement Resilience Social Support  ADL's:  Intact  Cognition: WNL  Sleep:  Good  Screenings: GAD-7    Health and safety inspector from 04/05/2022 in [place] at [place] from 09/06/2021 in [place] at [place] from 07/06/2021 in [place] at [place] from 06/20/2021 in [place] at [place] from 06/06/2021 in [place] at [facility]  Total GAD-7 Score 9 15 17 17 14       PHQ2-9    Flowsheet Row Counselor from 04/05/2022 in [place] at [place] Video Visit from 10/31/2021 in [place] at [place] Video Visit from 08/01/2021 in [place] at [place] from 05/26/2021 in [place] at [place] Video Visit from 05/16/2021 in [place] at [facility] Total Score 1 1 0 0 1      Flowsheet Row Counselor from 04/05/2022 in [place] at [place] Video Visit from 10/31/2021 in [place] at [place] ED from 09/19/2021 in [facility] at [place] No Risk No Risk No Risk        Assessment and Plan: This patient is a 62 year old female with a history of depression and anxiety.  For the most part she is  doing okay on her current regimen.  She will continue Zoloft 100 mg daily for depression and Xanax 1 mg 4 times daily for anxiety.  She will return to see me in 3 months  Collaboration of Care: Collaboration of Care: Referral or follow-up with counselor/therapist AEB patient will continue therapy with Maurice Small in our office  Patient/Guardian was advised Release of Information must be obtained prior to any record release in order to collaborate their care with an outside provider. Patient/Guardian was advised if they have not already done so to contact the registration department to sign all necessary forms in order for [place] to release information regarding their care.   Consent: Patient/Guardian gives verbal consent for treatment and assignment of benefits for services provided during this visit. Patient/Guardian expressed understanding and agreed to proceed.    Levonne Spiller, MD 05/04/2022, 1:18 PM

## 2022-05-16 ENCOUNTER — Ambulatory Visit (INDEPENDENT_AMBULATORY_CARE_PROVIDER_SITE_OTHER): Payer: Medicare HMO | Admitting: Psychiatry

## 2022-05-16 ENCOUNTER — Encounter (HOSPITAL_COMMUNITY): Payer: Self-pay

## 2022-05-16 DIAGNOSIS — F331 Major depressive disorder, recurrent, moderate: Secondary | ICD-10-CM

## 2022-05-16 DIAGNOSIS — F411 Generalized anxiety disorder: Secondary | ICD-10-CM
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
## 2022-05-30 ENCOUNTER — Ambulatory Visit (INDEPENDENT_AMBULATORY_CARE_PROVIDER_SITE_OTHER): Payer: Medicare HMO | Admitting: Psychiatry

## 2022-05-30 DIAGNOSIS — F411 Generalized anxiety disorder: Secondary | ICD-10-CM
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
## 2022-06-13 ENCOUNTER — Ambulatory Visit (HOSPITAL_COMMUNITY): Payer: Medicare HMO | Admitting: Psychiatry

## 2022-06-13 DIAGNOSIS — F331 Major depressive disorder, recurrent, moderate: Secondary | ICD-10-CM | POA: Diagnosis not present

## 2022-06-13 DIAGNOSIS — F411 Generalized anxiety disorder: Secondary | ICD-10-CM | POA: Diagnosis not present
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
## 2022-07-05 ENCOUNTER — Telehealth (HOSPITAL_COMMUNITY): Payer: Self-pay | Admitting: *Deleted

## 2022-07-05 ENCOUNTER — Other Ambulatory Visit (HOSPITAL_COMMUNITY): Payer: Self-pay | Admitting: Psychiatry

## 2022-07-05 MED ORDER — ALPRAZOLAM 1 MG PO TABS
1.0000 mg | ORAL_TABLET | Freq: Four times a day (QID) | ORAL | 0 refills | Status: DC
Start: 2022-07-05 — End: 2022-08-02

## 2022-07-05 NOTE — Telephone Encounter (Signed)
Patient called stating she is needing refills for her Xanax called to [facility] in [place].

## 2022-07-05 NOTE — Telephone Encounter (Signed)
sent 

## 2022-07-07 NOTE — Telephone Encounter (Signed)
Spoke with patient and informed her with what provider stated and she verbalized understanding.  °

## 2022-07-31 ENCOUNTER — Ambulatory Visit (INDEPENDENT_AMBULATORY_CARE_PROVIDER_SITE_OTHER): Payer: Medicare HMO | Admitting: Psychiatry

## 2022-07-31 DIAGNOSIS — F411 Generalized anxiety disorder: Secondary | ICD-10-CM | POA: Diagnosis not present
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
## 2022-08-02 ENCOUNTER — Telehealth (INDEPENDENT_AMBULATORY_CARE_PROVIDER_SITE_OTHER): Payer: Medicare HMO | Admitting: Psychiatry

## 2022-08-02 ENCOUNTER — Encounter (HOSPITAL_COMMUNITY): Payer: Self-pay | Admitting: Psychiatry

## 2022-08-02 DIAGNOSIS — F331 Major depressive disorder, recurrent, moderate: Secondary | ICD-10-CM | POA: Diagnosis not present

## 2022-08-02 DIAGNOSIS — F411 Generalized anxiety disorder: Secondary | ICD-10-CM | POA: Diagnosis not present

## 2022-08-02 MED ORDER — ALPRAZOLAM 1 MG PO TABS: 1.0000 mg | ORAL_TABLET | Freq: Three times a day (TID) | ORAL | 0 refills | Status: DC

## 2022-08-02 MED ORDER — SERTRALINE HCL 100 MG PO TABS
100.0000 mg | ORAL_TABLET | Freq: Every day | ORAL | 1 refills | Status: DC
Start: 2022-08-02 — End: 2022-09-25

## 2022-08-02 NOTE — Progress Notes (Signed)
Virtual Visit via Telephone Note  I connected with Heather Wang on 08/02/22 at  1:00 PM EDT by telephone and verified that I am speaking with the correct person using two identifiers.  Location: Patient: home Provider: office   I discussed the limitations, risks, security and privacy concerns of performing an evaluation and management service by telephone and the availability of in person appointments. I also discussed with the patient that there may be a patient responsible charge related to this service. The patient expressed understanding and agreed to proceed.     I discussed the assessment and treatment plan with the patient. The patient was provided an opportunity to ask questions and all were answered. The patient agreed with the plan and demonstrated an understanding of the instructions.   The patient was advised to call back or seek an in-person evaluation if the symptoms worsen or if the condition fails to improve as anticipated.  I provided 15 minutes of non-face-to-face time during this encounter.   Diannia Ruder, MD  [facility] MD/PA/NP OP Progress Note  08/02/2022 1:13 PM Heather Wang  MRN:  [id number]  Chief Complaint:  Chief Complaint  Patient presents with   Depression   Anxiety   Follow-up   HPI: This patient is a 62 year old widowed white female who lives with her granddaughter and granddaughter's boyfriend in [place]. She is on disability.   The patient returns for follow-up after 3 months regarding her depression and anxiety.  She states that a good friend of hers just passed away last week.  He had a lot of illnesses and had to be hospitalized.  She had help to take care of them.  She has been tearful and sad about it but is pulling herself together.  She does think that her medications have been helpful.  She admits that she does not take the Xanax 4 times a day and given her age I would like to start reducing it and she is agreeable to going down to 3 times a day.   The Zoloft has been helpful for her mood and she denies significant depression other than the grief related to her friend's death.  She denies suicidal thoughts or plans. Visit Diagnosis:    ICD-10-CM   1. Generalized anxiety disorder  F41.1     2. Major depressive disorder, recurrent episode, moderate (HCC)  F33.1       Past Psychiatric History: none  Past Medical History:  Past Medical History:  Diagnosis Date   Anxiety    Chronic back pain    COPD (chronic obstructive pulmonary disease) (HCC)    Diagnosed in August 2016   Graves disease    Osteopetrosis    Panic attacks    Pneumonia    Thyroid disease     Past Surgical History:  Procedure Laterality Date   NEPHRECTOMY     native    TOTAL ABDOMINAL HYSTERECTOMY     TUBAL LIGATION      Family Psychiatric History: See below  Family History:  Family History  Problem Relation Age of Onset   Anxiety disorder Mother    Depression Mother    Heart defect Other        family history    Cancer Other        family history    Arthritis Other        family history    Depression Brother    Anxiety disorder Maternal Grandmother    Depression Maternal Grandmother  Social History:  Social History   Socioeconomic History   Marital status: Married    Spouse name: Not on file   Number of children: Not on file   Years of education: 11th    Highest education level: Not on file  Occupational History   Occupation: unemployed   Tobacco Use   Smoking status: Every Day    Packs/day: 1    Types: Cigarettes   Smokeless tobacco: Never  Substance and Sexual Activity   Alcohol use: No   Drug use: No   Sexual activity: Never  Other Topics Concern   Not on file  Social History Narrative   Not on file   Social Determinants of Health   Financial Resource Strain: Not on file  Food Insecurity: Not on file  Transportation Needs: Not on file  Physical Activity: Not on file  Stress: Not on file  Social Connections: Not  on file    Allergies:  Allergies  Allergen Reactions   Nsaids Other (See Comments)    Kidney condition   Sulfa Antibiotics Nausea And Vomiting    Metabolic Disorder Labs: No results found for: "HGBA1C", "MPG" No results found for: "PROLACTIN" No results found for: "CHOL", "TRIG", "HDL", "CHOLHDL", "VLDL", "LDLCALC" No results found for: "TSH"  Therapeutic Level Labs: No results found for: "LITHIUM" No results found for: "VALPROATE" No results found for: "CBMZ"  Current Medications: Current Outpatient Medications  Medication Sig Dispense Refill   acetaminophen (TYLENOL) 500 MG tablet Take 1,000 mg by mouth every 6 (six) hours as needed.     ALPRAZolam (XANAX) 1 MG tablet Take 1 tablet (1 mg total) by mouth 3 (three) times daily. 90 tablet 0   budesonide-formoterol (SYMBICORT) 160-4.5 MCG/ACT inhaler Inhale 2 puffs into the lungs 2 (two) times daily.     cyclobenzaprine (FLEXERIL) 10 MG tablet Take 1 tablet (10 mg total) by mouth 2 (two) times daily as needed for muscle spasms. 20 tablet 0   denosumab (PROLIA) 60 MG/ML SOLN injection Inject 60 mg into the skin every 6 (six) months. Administer in upper arm, thigh, or abdomen     sertraline (ZOLOFT) 100 MG tablet Take 1 tablet (100 mg total) by mouth daily. 90 tablet 1   No current facility-administered medications for this visit.     Musculoskeletal: Strength & Muscle Tone: na Gait & Station: na Patient leans: N/A  Psychiatric Specialty Exam: Review of Systems  All other systems reviewed and are negative.   There were no vitals taken for this visit.There is no height or weight on file to calculate BMI.  General Appearance: NA  Eye Contact:  NA  Speech:  Clear and Coherent  Volume:  Normal  Mood:  Euthymic  Affect:  NA  Thought Process:  Goal Directed  Orientation:  Full (Time, Place, and Person)  Thought Content: WDL   Suicidal Thoughts:  No  Homicidal Thoughts:  No  Memory:  Immediate;   Good Recent;    Good Remote;   Good  Judgement:  Good  Insight:  Fair  Psychomotor Activity:  Normal  Concentration:  Concentration: Good and Attention Span: Good  Recall:  Good  Fund of Knowledge: Good  Language: Good  Akathisia:  No  Handed:  Right  AIMS (if indicated): not done  Assets:  Communication Skills Desire for Improvement Resilience Social Support  ADL's:  Intact  Cognition: WNL  Sleep:  Good   Screenings: GAD-7    Advertising copywriter from 06/13/2022 in [facility] at [facility] from 05/30/2022 in [facility] at [place] Counselor from 04/05/2022 in [facility] Outpatient [facility] at [place] Counselor from 09/06/2021 in [facility] Outpatient [facility] at [place] Counselor from 07/06/2021 in [facility]patient Behavior[facility] at [facility]  Total GAD-7 Score 6 [phone] 17       [id number]    Flowsheet Row Counselor from 04/05/2022 in [facility] at [place] Video Visit from 10/31/2021 in [facility] at [place] Video Visit from 08/01/2021 in [facility] Out[facility] at [place] Counselor from 05/26/2021 in [facility] at [place] Video Visit from 05/16/2021 in [facility] at [facility] Total Score 1 1 0 0 1      Flowsheet Row Counselor from 04/05/2022 in [facility] at [place] Video Visit from 10/31/2021 in [facility] at [place] ED from 09/19/2021 in [facility] Emergency Department at [facility]  C-SSRS RISK CATEGORY No Risk No Risk No Risk        Assessment and Plan: This patient is a 62 year old female with a history of depression and anxiety.  She is generally doing okay on her regimen but I would like to cut down the Xanax.  She will cut it down to Xanax 1 mg 3 times daily for  anxiety.  She will continue Zoloft 100 mg daily for depression.  She will return to see me in 3 months  Collaboration of Care: Collaboration of Care: Referral or follow-up with counselor/therapist AEB patient will continue therapy with Florencia Reasons in our office  Patient/Guardian was advised Release of Information must be obtained prior to any record release in order to collaborate their care with an outside provider. Patient/Guardian was advised if they have not already done so to contact the registration department to sign all necessary forms in order for [place] to release information regarding their care.   Consent: Patient/Guardian gives verbal consent for treatment and assignment of benefits for services provided during this visit. Patient/Guardian expressed understanding and agreed to proceed.    Diannia Ruder, MD 08/02/2022, 1:13 PM

## 2022-09-14 ENCOUNTER — Ambulatory Visit (INDEPENDENT_AMBULATORY_CARE_PROVIDER_SITE_OTHER): Payer: Medicare HMO | Admitting: Psychiatry

## 2022-09-14 DIAGNOSIS — F411 Generalized anxiety disorder: Secondary | ICD-10-CM | POA: Diagnosis not present
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
## 2022-09-25 ENCOUNTER — Ambulatory Visit (HOSPITAL_COMMUNITY): Payer: Medicare HMO | Admitting: Psychiatry

## 2022-09-25 ENCOUNTER — Encounter (HOSPITAL_COMMUNITY): Payer: Self-pay | Admitting: Psychiatry

## 2022-09-25 VITALS — BP 97/61 | HR 66 | Ht 63.0 in | Wt 103.6 lb

## 2022-09-25 DIAGNOSIS — F331 Major depressive disorder, recurrent, moderate: Secondary | ICD-10-CM

## 2022-09-25 DIAGNOSIS — R413 Other amnesia: Secondary | ICD-10-CM | POA: Diagnosis not present

## 2022-09-25 DIAGNOSIS — F411 Generalized anxiety disorder: Secondary | ICD-10-CM

## 2022-09-25 MED ORDER — ALPRAZOLAM 1 MG PO TABS
1.0000 mg | ORAL_TABLET | Freq: Two times a day (BID) | ORAL | 0 refills | Status: DC
Start: 2022-09-25 — End: 2022-11-02

## 2022-09-25 MED ORDER — SERTRALINE HCL 100 MG PO TABS
100.0000 mg | ORAL_TABLET | Freq: Every day | ORAL | 1 refills | Status: DC
Start: 2022-09-25 — End: 2022-11-02

## 2022-09-25 NOTE — Progress Notes (Signed)
BH MD/PA/NP OP Progress Note  09/25/2022 11:17 AM RAGEN ROWSELL  MRN:  [id number]  Chief Complaint:  Chief Complaint  Patient presents with   Anxiety   Memory Loss   Follow-up   HPI: This patient is a 62 year old widowed white female who is now living with her brother and sister-in-law and [place].  She is on disability.  The patient returns for follow-up as a work in today.  She was last seen 2 months ago.  She has a history of anxiety and depression.  Her sister-in-law [name] is with her and [name] now has power of attorney over the patient's affairs.  [name] states that the patient has become more confused and having memory loss.  She had not been taking her medications properly or at all.  She had been off Xanax for about 2 months and had never taken the Zoloft in months.  She recently saw York Ram her PHP who suggested a Xanax taper.  The patient does not seem to remember things short-term but does remember long-term.  She has been referred to neurology.  The patient had a complete laboratory workup and everything was negative except for elevated cholesterol and triglycerides.  She is now back on medication for this.  I do agree with getting her off Xanax and last time we cut her down to 3 a day.  I suggested that we go down to 2 and a after the current prescription ends and her sister-in-law's in agreement.  I also suggested restarting the Zoloft.  The patient herself does not seem aware of the memory loss and states that she is actually doing okay.  However she still smoking quite a bit and I urged her to quit or at least start monitoring how much she is smoking per day. Visit Diagnosis:    ICD-10-CM   1. Generalized anxiety disorder  F41.1     2. Major depressive disorder, recurrent episode, moderate (HCC)  F33.1     3. Memory loss  R41.3       Past Psychiatric History: none  Past Medical History:  Past Medical History:  Diagnosis Date   Anxiety    Chronic back pain     COPD (chronic obstructive pulmonary disease) (HCC)    Diagnosed in August 2016   Graves disease    Osteopetrosis    Panic attacks    Pneumonia    Thyroid disease     Past Surgical History:  Procedure Laterality Date   NEPHRECTOMY     native    TOTAL ABDOMINAL HYSTERECTOMY     TUBAL LIGATION      Family Psychiatric History: See below  Family History:  Family History  Problem Relation Age of Onset   Anxiety disorder Mother    Depression Mother    Heart defect Other        family history    Cancer Other        family history    Arthritis Other        family history    Depression Brother    Anxiety disorder Maternal Grandmother    Depression Maternal Grandmother     Social History:  Social History   Socioeconomic History   Marital status: Married    Spouse name: Not on file   Number of children: Not on file   Years of education: 11th    Highest education level: Not on file  Occupational History   Occupation: unemployed   Tobacco Use  Smoking status: Every Day    Packs/day: 1    Types: Cigarettes   Smokeless tobacco: Never  Substance and Sexual Activity   Alcohol use: No   Drug use: No   Sexual activity: Never  Other Topics Concern   Not on file  Social History Narrative   Not on file   Social Determinants of Health   Financial Resource Strain: Not on file  Food Insecurity: Not on file  Transportation Needs: Not on file  Physical Activity: Not on file  Stress: Not on file  Social Connections: Not on file    Allergies:  Allergies  Allergen Reactions   Nsaids Other (See Comments)    Kidney condition   Sulfa Antibiotics Nausea And Vomiting    Metabolic Disorder Labs: No results found for: "HGBA1C", "MPG" No results found for: "PROLACTIN" No results found for: "CHOL", "TRIG", "HDL", "CHOLHDL", "VLDL", "LDLCALC" No results found for: "TSH"  Therapeutic Level Labs: No results found for: "LITHIUM" No results found for: "VALPROATE" No results  found for: "CBMZ"  Current Medications: Current Outpatient Medications  Medication Sig Dispense Refill   atorvastatin (LIPITOR) 80 MG tablet Take by mouth.     acetaminophen (TYLENOL) 500 MG tablet Take 1,000 mg by mouth every 6 (six) hours as needed. (Patient not taking: Reported on 09/25/2022)     ALPRAZolam (XANAX) 1 MG tablet Take 1 tablet (1 mg total) by mouth 2 (two) times daily. 60 tablet 0   denosumab (PROLIA) 60 MG/ML SOLN injection Inject 60 mg into the skin every 6 (six) months. Administer in upper arm, thigh, or abdomen (Patient not taking: Reported on 09/25/2022)     sertraline (ZOLOFT) 100 MG tablet Take 1 tablet (100 mg total) by mouth daily. 90 tablet 1   No current facility-administered medications for this visit.     Musculoskeletal: Strength & Muscle Tone: within normal limits Gait & Station: normal Patient leans: N/A  Psychiatric Specialty Exam: Review of Systems  Psychiatric/Behavioral:  Positive for confusion.   All other systems reviewed and are negative.   Blood pressure 97/61, pulse 66, height 5\' 3"  (1.6 m), weight 103 lb 9.6 oz (47 kg), SpO2 98 %.Body mass index is 18.35 kg/m.  General Appearance: Casual and Fairly Groomed  Eye Contact:  Fair  Speech:  Clear and Coherent  Volume:  Normal  Mood:  Euthymic  Affect:  Flat  Thought Process:  Goal Directed  Orientation:  Full (Time, Place, and Person)  Thought Content: Rumination   Suicidal Thoughts:  No  Homicidal Thoughts:  No  Memory:  Immediate;   Poor Recent;   Fair Remote;   NA  Judgement:  Impaired  Insight:  Lacking  Psychomotor Activity:  Normal  Concentration:  Concentration: Fair and Attention Span: Fair  Recall:  Poor  Fund of Knowledge: Fair  Language: Good  Akathisia:  No  Handed:  Right  AIMS (if indicated): not done  Assets:  Communication Skills Desire for Improvement Resilience Social Support  ADL's:  Intact  Cognition: Impaired,  Mild  Sleep:  Good   Screenings: GAD-7     Flowsheet Row Office Visit from 09/25/2022 in [facility] at [place] Counselor from 09/14/2022 in [facility] at [place] Counselor from 06/13/2022 in [facility] at [place] Counselor from 05/30/2022 in [facility] at [place] Counselor from 04/05/2022 in [facility] at [facility]  Total GAD-7 Score 5 10 6 9  9  PHQ2-9    Flowsheet Row Office Visit from 09/25/2022 in [facility] at [place] Counselor from 09/14/2022 in [facility] at [place] Counselor from 04/05/2022 in [facility] at [place] Video Visit from 10/31/2021 in [facility] Outpatient Behavior[facility] at [place] Video Visit from 08/01/2021 in [facility] at [facility] Total Score 1 0 1 1 0  PHQ-9 Total Score 5 -- -- -- --      Flowsheet Row Counselor from 04/05/2022 in [facility] at [place] Video Visit from 10/31/2021 in [facility] at [place] ED from 09/19/2021 in [facility] at [facility]  C-SSRS RISK CATEGORY No Risk No Risk No Risk        Assessment and Plan: This patient is a 62 year old female with a history of depression anxiety and now with symptoms that seem congruent with early dementia.  We will continue to gradually cut down Xanax.  Not next month we will go to Xanax 1 mg twice daily.  She will restart the Zoloft at 50 mg daily and then work up to 100 mg daily for depression.  She will return to see me in 4 weeks  Collaboration of Care: Collaboration of Care: Referral or follow-up with counselor/therapist AEB patient will continue therapy with Florencia Reasons in our office  Patient/Guardian was advised Release of Information must be obtained prior to any  record release in order to collaborate their care with an outside provider. Patient/Guardian was advised if they have not already done so to contact the registration department to sign all necessary forms in order for [place] to release information regarding their care.   Consent: Patient/Guardian gives verbal consent for treatment and assignment of benefits for services provided during this visit. Patient/Guardian expressed understanding and agreed to proceed.    Diannia Ruder, MD 09/25/2022, 11:17 AM

## 2022-11-02 ENCOUNTER — Telehealth (INDEPENDENT_AMBULATORY_CARE_PROVIDER_SITE_OTHER): Payer: Medicare HMO | Admitting: Psychiatry

## 2022-11-02 ENCOUNTER — Encounter (HOSPITAL_COMMUNITY): Payer: Self-pay | Admitting: Psychiatry

## 2022-11-02 DIAGNOSIS — F411 Generalized anxiety disorder: Secondary | ICD-10-CM

## 2022-11-02 DIAGNOSIS — R413 Other amnesia: Secondary | ICD-10-CM

## 2022-11-02 DIAGNOSIS — F331 Major depressive disorder, recurrent, moderate: Secondary | ICD-10-CM

## 2022-11-02 MED ORDER — ALPRAZOLAM 1 MG PO TABS: 1.0000 mg | ORAL_TABLET | Freq: Two times a day (BID) | ORAL | 2 refills | Status: AC

## 2022-11-02 NOTE — Progress Notes (Signed)
Virtual Visit via Telephone Note  I connected with Heather Wang on 11/02/22 at  9:40 AM EDT by telephone and verified that I am speaking with the correct person using two identifiers.  Location: Patient: home Provider: home office   I discussed the limitations, risks, security and privacy concerns of performing an evaluation and management service by telephone and the availability of in person appointments. I also discussed with the patient that there may be a patient responsible charge related to this service. The patient expressed understanding and agreed to proceed.       I discussed the assessment and treatment plan with the patient. The patient was provided an opportunity to ask questions and all were answered. The patient agreed with the plan and demonstrated an understanding of the instructions.   The patient was advised to call back or seek an in-person evaluation if the symptoms worsen or if the condition fails to improve as anticipated.  I provided 20 minutes of non-face-to-face time during this encounter.   Heather Ruder, MD  [facility] MD/PA/NP OP Progress Note  11/02/2022 9:55 AM [name]  MRN:  [id number]  Chief Complaint:  Chief Complaint  Patient presents with   Anxiety   Memory Loss   Follow-up   HPI: This patient is a 62 year old widowed white female who is now living with her brother and sister-in-law and [place]. She is on disability   The patient and her sister-in-law Heather Wang who is power of attorney return by phone after 1 month.  Last time we had found out that the patient had become increasingly confused and experiencing severe memory loss.  She had not been taking her medications.  Her brother and sister-in-law had her moved in with them.  The patient was reinstated on Zoloft 50 mg.  According to Heather Wang that she has not really seen any change in fact it which seems to be making her drowsy.  I suggested we taper this off.  The Xanax 1 mg twice daily seems to  help her stay calm.  She denies being depressed or sad.  However her sister-in-law states she has absolutely no motivation and has to be reminded to do basic things like take a shower or get dressed.  She is eating fairly well.  The memory loss seems to be fairly acute but she is not able to get into neurology until November.  She has not had a brain MRI.  I explained that we need to get a better diagnosis as to know what were dealing with here and this may not happen until she sees a neurologist.  In the meantime I think the Xanax is safe to continue. Visit Diagnosis:    ICD-10-CM   1. Generalized anxiety disorder  F41.1 ALPRAZolam (XANAX) 1 MG tablet    2. Major depressive disorder, recurrent episode, moderate (HCC)  F33.1     3. Memory loss  R41.3       Past Psychiatric History: none  Past Medical History:  Past Medical History:  Diagnosis Date   Anxiety    Chronic back pain    COPD (chronic obstructive pulmonary disease) (HCC)    Diagnosed in August 2016   Graves disease    Osteopetrosis    Panic attacks    Pneumonia    Thyroid disease     Past Surgical History:  Procedure Laterality Date   NEPHRECTOMY     native    TOTAL ABDOMINAL HYSTERECTOMY     TUBAL LIGATION  Family Psychiatric History: See below  Family History:  Family History  Problem Relation Age of Onset   Anxiety disorder Mother    Depression Mother    Heart defect Other        family history    Cancer Other        family history    Arthritis Other        family history    Depression Brother    Anxiety disorder Maternal Grandmother    Depression Maternal Grandmother     Social History:  Social History   Socioeconomic History   Marital status: Married    Spouse name: Not on file   Number of children: Not on file   Years of education: 11th    Highest education level: Not on file  Occupational History   Occupation: unemployed   Tobacco Use   Smoking status: Every Day    Current  packs/day: 1.00    Types: Cigarettes   Smokeless tobacco: Never  Substance and Sexual Activity   Alcohol use: No   Drug use: No   Sexual activity: Never  Other Topics Concern   Not on file  Social History Narrative   Not on file   Social Determinants of Health   Financial Resource Strain: Low Risk  (09/21/2022)   Received from [facility], [facility]   Overall Financial Resource Strain (CARDIA)    Difficulty of Paying Living Expenses: Not hard at all  Food Insecurity: No Food Insecurity (09/21/2022)   [facility] from [facility], [facility]   Hunger Vital Sign    Worried About Running Out of Food in the Last Year: Never true    Ran Out of Food in the Last Year: Never true  Transportation Needs: No Transportation Needs (09/21/2022)   [facility] from [facility], [facility]   PRAPARE - Transportation    Lack of Transportation (Medical): No    Lack of Transportation (Non-Medical): No  Physical Activity: Insufficiently Active (01/16/2022)   [facility], [facility]   Exercise Vital Sign    Days of Exercise per Week: 2 days    Minutes of Exercise per Session: 20 min  Stress: Stress Concern Present (01/16/2022)   Received from [facility], [facility] - Occupational Stress Questionnaire    Feeling of Stress : To some extent  Social Connections: Socially Integrated (01/16/2022)   Received from [facility], [facility]   Social Network    How would you rate your social network (family, work, friends)?: Good participation with social networks    Allergies:  Allergies  Allergen Reactions   Nsaids Other (See Comments)    Kidney condition   Sulfa Antibiotics Nausea And Vomiting    Metabolic Disorder Labs: No results found for: "HGBA1C", "MPG" No results found for: "PROLACTIN" No results found for: "CHOL", "TRIG", "HDL", "CHOLHDL", "VLDL", "LDLCALC" No results found for: "TSH"  Therapeutic Level  Labs: No results found for: "LITHIUM" No results found for: "VALPROATE" No results found for: "CBMZ"  Current Medications: Current Outpatient Medications  Medication Sig Dispense Refill   acetaminophen (TYLENOL) 500 MG tablet Take 1,000 mg by mouth every 6 (six) hours as needed. (Patient not taking: Reported on 09/25/2022)     ALPRAZolam (XANAX) 1 MG tablet Take 1 tablet (1 mg total) by mouth 2 (two) times daily. 60 tablet 2   atorvastatin (LIPITOR) 80 MG tablet Take by mouth.     denosumab (PROLIA) 60  MG/ML SOLN injection Inject 60 mg into the skin every 6 (six) months. Administer in upper arm, thigh, or abdomen (Patient not taking: Reported on 09/25/2022)     No current facility-administered medications for this visit.     Musculoskeletal: Strength & Muscle Tone: na Gait & Station: na Patient leans: na  Psychiatric Specialty Exam: Review of Systems  Neurological:        Memory loss  Psychiatric/Behavioral:  Positive for decreased concentration.   All other systems reviewed and are negative.   There were no vitals taken for this visit.There is no height or weight on file to calculate BMI.  General Appearance: NA  Eye Contact:  NA  Speech:  Clear and Coherent  Volume:  Normal  Mood:  Euthymic  Affect:  NA  Thought Process:  Irrelevant  Orientation:  Other:  Person and place  Thought Content: WDL   Suicidal Thoughts:  No  Homicidal Thoughts:  No  Memory:  Immediate;   Fair Recent;   Poor Remote;   Poor  Judgement:  Impaired  Insight:  Lacking  Psychomotor Activity:  Normal  Concentration:  Concentration: Poor and Attention Span: Poor  Recall:  Poor  Fund of Knowledge: Fair  Language: Good  Akathisia:  No  Handed:  Right  AIMS (if indicated): not done  Assets:  Manufacturing systems engineer Physical Health Resilience Social Support  ADL's:  Impaired  Cognition: Impaired,  Mild  Sleep:  Good   Screenings: GAD-7    Flowsheet Row Office Visit from 09/25/2022 in [facility] [place] Counselor from 09/14/2022 in [facility] at [place] Counselor from 06/13/2022 in [facility] at [place] Counselor from 05/30/2022 in [facility] Out[facility] at [place] [address] Counselor from 04/05/2022 in [facility] at [place]  Total GAD-7 Score 5 10 6 9 9       PHQ2-9    Flowsheet Row Office Visit from 09/25/2022 in [facility] at [place] Counselor from 09/14/2022 in [facility] at [place] Counselor from 04/05/2022 in [facility] at [place] Video Visit from 10/31/2021 in [facility] at [place] Video Visit from 08/01/2021 in [facility] at [facility] Total Score 1 0 1 1 0  PHQ-9 Total Score 5 -- -- -- --      Flowsheet Row Counselor from 04/05/2022 in [facility] at [place] Video Visit from 10/31/2021 in [facility] Out[facility] at [place] ED from 09/19/2021 in [facility] Emergency Department at [facility]  C-SSRS RISK CATEGORY No Risk No Risk No Risk        Assessment and Plan: This patient is a 62 year old female with a history of depression anxiety and possible early dementia.  Zoloft does not seem to be doing much for her so we will taper it off and discontinue it.  For now she will continue Xanax 1 mg twice daily for anxiety.  She will return to see me in 3 months  Collaboration of Care: Collaboration of Care: Primary Care Provider AEB notes are shared with PCP on the epic system  Patient/Guardian was advised Release of Information must be obtained prior to any record release in order to collaborate their care with an outside provider. Patient/Guardian was advised if they have not already done so to contact the registration  department to sign all necessary forms in order  for [place] to release information regarding their care.   Consent: Patient/Guardian gives verbal consent for treatment and assignment of benefits for services provided during this visit. Patient/Guardian expressed understanding and agreed to proceed.    Heather Ruder, MD 11/02/2022, 9:55 AM

## 2022-11-06 ENCOUNTER — Ambulatory Visit (INDEPENDENT_AMBULATORY_CARE_PROVIDER_SITE_OTHER): Payer: Medicare HMO | Admitting: Psychiatry

## 2022-11-06 DIAGNOSIS — F411 Generalized anxiety disorder: Secondary | ICD-10-CM | POA: Diagnosis not present
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
## 2022-11-24 ENCOUNTER — Other Ambulatory Visit (HOSPITAL_COMMUNITY): Payer: Self-pay | Admitting: Adult Health Nurse Practitioner

## 2022-11-24 DIAGNOSIS — Z1231 Encounter for screening mammogram for malignant neoplasm of breast: Secondary | ICD-10-CM

## 2023-01-05 ENCOUNTER — Encounter (HOSPITAL_COMMUNITY): Payer: Self-pay

## 2023-01-05 ENCOUNTER — Ambulatory Visit (HOSPITAL_COMMUNITY)
Admission: RE | Admit: 2023-01-05 | Discharge: 2023-01-05 | Disposition: A | Discharge status: Home / Self Care | Payer: Medicare HMO | Source: Ambulatory Visit | Attending: Adult Health Nurse Practitioner | Admitting: Adult Health Nurse Practitioner

## 2023-01-05 DIAGNOSIS — Z1231 Encounter for screening mammogram for malignant neoplasm of breast: Secondary | ICD-10-CM | POA: Insufficient documentation

## 2023-02-02 ENCOUNTER — Telehealth (INDEPENDENT_AMBULATORY_CARE_PROVIDER_SITE_OTHER): Payer: Medicare HMO | Admitting: Psychiatry

## 2023-02-02 ENCOUNTER — Encounter (HOSPITAL_COMMUNITY): Payer: Self-pay | Admitting: Psychiatry

## 2023-02-02 DIAGNOSIS — R413 Other amnesia: Secondary | ICD-10-CM

## 2023-02-02 DIAGNOSIS — F411 Generalized anxiety disorder: Secondary | ICD-10-CM | POA: Diagnosis not present

## 2023-02-02 DIAGNOSIS — F331 Major depressive disorder, recurrent, moderate: Secondary | ICD-10-CM | POA: Diagnosis not present

## 2023-02-02 MED ORDER — SERTRALINE HCL 100 MG PO TABS: 100.0000 mg | ORAL_TABLET | Freq: Every day | ORAL | 2 refills | Status: AC

## 2023-02-02 NOTE — Progress Notes (Signed)
Virtual Visit via Telephone Note  I connected with NYKIA BELLMAN on 02/02/23 at 11:00 AM EST by telephone and verified that I am speaking with the correct person using two identifiers.  Location: Patient: home Provider: office   I discussed the limitations, risks, security and privacy concerns of performing an evaluation and management service by telephone and the availability of in person appointments. I also discussed with the patient that there may be a patient responsible charge related to this service. The patient expressed understanding and agreed to proceed.     I discussed the assessment and treatment plan with the patient. The patient was provided an opportunity to ask questions and all were answered. The patient agreed with the plan and demonstrated an understanding of the instructions.   The patient was advised to call back or seek an in-person evaluation if the symptoms worsen or if the condition fails to improve as anticipated.  I provided 20 minutes of non-face-to-face time during this encounter.   Diannia Ruder, MD  [facility] MD/PA/NP OP Progress Note  02/02/2023 11:41 AM [name]  MRN:  [id number]  Chief Complaint:  Chief Complaint  Patient presents with   Memory Loss   Anxiety   HPI:  This patient is a 62 year old widowed white female who is now living with her brother and sister-in-law in [place]. She is on disability   The patient and her sister-in-law [name] who has power of attorney return by phone after 3 months.  [name] states that nothing much is changed with the patient.  Her PCP is gradually getting her off Xanax and now she is on 0.5 mg daily.  She does not seem to be any different with the lower dosage.  She is eating and sleeping well and seems to be in good spirits.  She still is suffering from memory loss but it seems to be slightly better than it was initially.  She recently had psychological [facility] at [facility] by a psychologist but I cannot access the  records.  [name] states that it showed that she definitely has early dementia.  She has been tried on Namenda and Aricept and cannot tolerate either 1.  In speaking to the patient she sounds fairly upbeat.  Her sister-in-law is going to try to find another placement for her but so far and nothing has openings Visit Diagnosis:    ICD-10-CM   1. Generalized anxiety disorder  F41.1     2. Major depressive disorder, recurrent episode, moderate (HCC)  F33.1     3. Memory loss  R41.3       Past Psychiatric History: none  Past Medical History:  Past Medical History:  Diagnosis Date   Anxiety    Chronic back pain    COPD (chronic obstructive pulmonary disease) (HCC)    Diagnosed in August 2016   Graves disease    Osteopetrosis    Panic attacks    Pneumonia    Thyroid disease     Past Surgical History:  Procedure Laterality Date   NEPHRECTOMY     native    TOTAL ABDOMINAL HYSTERECTOMY     TUBAL LIGATION      Family Psychiatric History: see below  Family History:  Family History  Problem Relation Age of Onset   Anxiety disorder Mother    Depression Mother    Heart defect Other        family history    Cancer Other        family history  Arthritis Other        family history    Depression Brother    Anxiety disorder Maternal Grandmother    Depression Maternal Grandmother     Social History:  Social History   Socioeconomic History   Marital status: Married    Spouse name: Not on file   Number of children: Not on file   Years of education: 11th    Highest education level: Not on file  Occupational History   Occupation: unemployed   Tobacco Use   Smoking status: Every Day    Current packs/day: 1.00    Types: Cigarettes   Smokeless tobacco: Never  Substance and Sexual Activity   Alcohol use: No   Drug use: No   Sexual activity: Never  Other Topics Concern   Not on file  Social History Narrative   Not on file   Social Determinants of Health    Financial Resource Strain: Low Risk  (09/21/2022)   Received from [facility], [facility]   Overall Financial Resource Strain (CARDIA)    Difficulty of Paying Living Expenses: Not hard at all  Food Insecurity: No Food Insecurity (09/21/2022)   Received from [facility], [facility]   Hunger Vital Sign    Worried About Running Out of Food in the Last Year: Never true    Ran Out of Food in the Last Year: Never true  Transportation Needs: No Transportation Needs (09/21/2022)   Received from [facility], [facility]   PRAPARE - Transportation    Lack of Transportation (Medical): No    Lack of Transportation (Non-Medical): No  Physical Activity: Insufficiently Active (01/16/2022)   Received from [facility], [facility]   Exercise Vital Sign    Days of Exercise per Week: 2 days    Minutes of Exercise per Session: 20 min  Stress: Stress Concern Present (01/16/2022)   Received from [facility], [facility] - Occupational Stress Questionnaire    Feeling of Stress : To some extent  Social Connections: Unknown (01/17/2023)   [facility]   Social Network    Social Network: Not on file    Allergies:  Allergies  Allergen Reactions   Nsaids Other (See Comments)    Kidney condition   Sulfa Antibiotics Nausea And Vomiting    Metabolic Disorder Labs: No results found for: "HGBA1C", "MPG" No results found for: "PROLACTIN" No results found for: "CHOL", "TRIG", "HDL", "CHOLHDL", "VLDL", "LDLCALC" No results found for: "TSH"  Therapeutic Level Labs: No results found for: "LITHIUM" No results found for: "VALPROATE" No results found for: "CBMZ"  Current Medications: Current Outpatient Medications  Medication Sig Dispense Refill   acetaminophen (TYLENOL) 500 MG tablet Take 1,000 mg by mouth every 6 (six) hours as needed. (Patient not taking: Reported on 09/25/2022)     ALPRAZolam (XANAX) 1 MG tablet Take 1  tablet (1 mg total) by mouth 2 (two) times daily. 60 tablet 2   atorvastatin (LIPITOR) 80 MG tablet Take by mouth.     denosumab (PROLIA) 60 MG/ML SOLN injection Inject 60 mg into the skin every 6 (six) months. Administer in upper arm, thigh, or abdomen (Patient not taking: Reported on 09/25/2022)     sertraline (ZOLOFT) 100 MG tablet Take 1 tablet (100 mg total) by mouth daily. 30 tablet 2   No current facility-administered medications for this visit.     Musculoskeletal: Strength & Muscle Tone: na Gait & Station: na Patient leans:  N/A  Psychiatric Specialty Exam: Review of Systems  All other systems reviewed and are negative.   There were no vitals taken for this visit.There is no height or weight on file to calculate BMI.  General Appearance: NA  Eye Contact:  NA  Speech:  Clear and Coherent  Volume:  Normal  Mood:  Euthymic  Affect:  NA  Thought Process:  Goal Directed  Orientation:  Full (Time, Place, and Person)  Thought Content: WDL   Suicidal Thoughts:  No  Homicidal Thoughts:  No  Memory:  Immediate;   Poor Recent;   Poor Remote;   NA  Judgement:  Impaired  Insight:  Lacking  Psychomotor Activity:  Normal  Concentration:  Concentration: Fair and Attention Span: Fair  Recall:  Poor  Fund of Knowledge: Fair  Language: Good  Akathisia:  No  Handed:  Right  AIMS (if indicated): not done  Assets:  Communication Skills Resilience Social Support  ADL's:  Impaired  Cognition: Impaired,  Mild  Sleep:  Good   Screenings: GAD-7    Flowsheet Row Office Visit from 09/25/2022 in [facility] [place] Counselor from 09/14/2022 in [facility] [place] Counselor from 06/13/2022 in [facility] at [place] Counselor from 05/30/2022 in [facility] [place] Counselor from 04/05/2022 in [facility] at [place]  Total GAD-7  Score 5 10 6 9 9       PHQ2-9    Flowsheet Row Office Visit from 09/25/2022 in [facility] [place] Counselor from 09/14/2022 in [facility] [place] Counselor from 04/05/2022 in [facility] [place] Video Visit from 10/31/2021 in [facility] at [place] Video Visit from 08/01/2021 in [facility] Total Score 1 0 1 1 0  PHQ-9 Total Score 5 -- -- -- --      Flowsheet Row Counselor from 04/05/2022 in [facility] at [place] Video Visit from 10/31/2021 in [facility] at [place] ED from 09/19/2021 in [facility] Emergency Department at [facility]  C-SSRS RISK CATEGORY No Risk No Risk No Risk        Assessment and Plan: This patient is a 62 year old female with a history of depression anxiety and early dementia.  She is continued on the Zoloft 100 mg daily for depression.  She is being tapered off Xanax by PCP.  She will really return to see me in 3 months  Collaboration of Care: Collaboration of Care: Primary Care Provider AEB notes are shared with PCP on the epic system  Patient/Guardian was advised Release of Information must be obtained prior to any record release in order to collaborate their care with an outside provider. Patient/Guardian was advised if they have not already done so to contact the registration department to sign all necessary forms in order for [place] to release information regarding their care.   Consent: Patient/Guardian gives verbal consent for treatment and assignment of benefits for services provided during this visit. Patient/Guardian expressed understanding and agreed to proceed.    Diannia Ruder, MD 02/02/2023, 11:41 AM
